# Patient Record
Sex: Male | Born: 1955 | Race: White | Hispanic: No | Marital: Married | Smoking: Never smoker
Health system: Southern US, Community
[De-identification: ages and names within clinical notes are randomized; demographics above are authoritative.]

## PROBLEM LIST (undated history)

## (undated) DIAGNOSIS — Z8719 Personal history of other diseases of the digestive system: Secondary | ICD-10-CM

## (undated) DIAGNOSIS — T85598A Other mechanical complication of other gastrointestinal prosthetic devices, implants and grafts, initial encounter: Secondary | ICD-10-CM

## (undated) DIAGNOSIS — J189 Pneumonia, unspecified organism: Secondary | ICD-10-CM

## (undated) DIAGNOSIS — F329 Major depressive disorder, single episode, unspecified: Secondary | ICD-10-CM

## (undated) DIAGNOSIS — F32A Depression, unspecified: Secondary | ICD-10-CM

## (undated) DIAGNOSIS — G1221 Amyotrophic lateral sclerosis: Secondary | ICD-10-CM

## (undated) DIAGNOSIS — K219 Gastro-esophageal reflux disease without esophagitis: Secondary | ICD-10-CM

## (undated) DIAGNOSIS — D649 Anemia, unspecified: Secondary | ICD-10-CM

## (undated) DIAGNOSIS — Z9911 Dependence on respirator [ventilator] status: Secondary | ICD-10-CM

## (undated) DIAGNOSIS — G4700 Insomnia, unspecified: Secondary | ICD-10-CM

## (undated) DIAGNOSIS — R32 Unspecified urinary incontinence: Secondary | ICD-10-CM

## (undated) DIAGNOSIS — F410 Panic disorder [episodic paroxysmal anxiety] without agoraphobia: Secondary | ICD-10-CM

## (undated) HISTORY — PX: TRACHEOSTOMY: SUR1362

## (undated) HISTORY — PX: TONSILLECTOMY: SUR1361

## (undated) HISTORY — PX: COLONOSCOPY: SHX174

## (undated) HISTORY — DX: Anemia, unspecified: D64.9

## (undated) HISTORY — PX: OTHER SURGICAL HISTORY: SHX169

## (undated) HISTORY — PX: ESOPHAGOGASTRODUODENOSCOPY: SHX1529

## (undated) HISTORY — PX: BRONCHOSCOPY: SUR163

## (undated) HISTORY — DX: Amyotrophic lateral sclerosis: G12.21

---

## 1998-05-06 ENCOUNTER — Ambulatory Visit (HOSPITAL_BASED_OUTPATIENT_CLINIC_OR_DEPARTMENT_OTHER): Admission: RE | Admit: 1998-05-06 | Discharge: 1998-05-06 | Payer: Self-pay | Admitting: Oral Surgery

## 1998-09-30 ENCOUNTER — Encounter: Payer: Self-pay | Admitting: Emergency Medicine

## 1998-09-30 ENCOUNTER — Emergency Department (HOSPITAL_COMMUNITY): Admission: EM | Admit: 1998-09-30 | Discharge: 1998-09-30 | Payer: Self-pay | Admitting: Emergency Medicine

## 1999-02-26 ENCOUNTER — Emergency Department (HOSPITAL_COMMUNITY): Admission: EM | Admit: 1999-02-26 | Discharge: 1999-02-26 | Payer: Self-pay | Admitting: Emergency Medicine

## 2001-12-21 ENCOUNTER — Encounter: Payer: Self-pay | Admitting: Pulmonary Disease

## 2003-09-24 ENCOUNTER — Encounter: Admission: RE | Admit: 2003-09-24 | Discharge: 2003-09-24 | Payer: Self-pay | Admitting: Infectious Diseases

## 2003-10-05 ENCOUNTER — Ambulatory Visit (HOSPITAL_COMMUNITY): Admission: RE | Admit: 2003-10-05 | Discharge: 2003-10-05 | Payer: Self-pay | Admitting: Gastroenterology

## 2003-10-15 ENCOUNTER — Inpatient Hospital Stay (HOSPITAL_COMMUNITY): Admission: EM | Admit: 2003-10-15 | Discharge: 2003-10-20 | Payer: Self-pay

## 2004-04-01 ENCOUNTER — Ambulatory Visit (HOSPITAL_COMMUNITY): Admission: RE | Admit: 2004-04-01 | Discharge: 2004-04-01 | Payer: Self-pay | Admitting: Gastroenterology

## 2004-04-11 ENCOUNTER — Ambulatory Visit: Payer: Self-pay | Admitting: Gastroenterology

## 2004-04-11 ENCOUNTER — Ambulatory Visit: Payer: Self-pay | Admitting: Internal Medicine

## 2004-04-11 ENCOUNTER — Inpatient Hospital Stay (HOSPITAL_COMMUNITY): Admission: EM | Admit: 2004-04-11 | Discharge: 2004-06-02 | Payer: Self-pay | Admitting: Internal Medicine

## 2004-04-25 ENCOUNTER — Encounter (INDEPENDENT_AMBULATORY_CARE_PROVIDER_SITE_OTHER): Payer: Self-pay | Admitting: *Deleted

## 2004-04-28 ENCOUNTER — Encounter (INDEPENDENT_AMBULATORY_CARE_PROVIDER_SITE_OTHER): Payer: Self-pay | Admitting: *Deleted

## 2004-05-05 ENCOUNTER — Encounter (INDEPENDENT_AMBULATORY_CARE_PROVIDER_SITE_OTHER): Payer: Self-pay | Admitting: *Deleted

## 2004-05-08 ENCOUNTER — Encounter (INDEPENDENT_AMBULATORY_CARE_PROVIDER_SITE_OTHER): Payer: Self-pay | Admitting: Specialist

## 2004-05-15 ENCOUNTER — Encounter (INDEPENDENT_AMBULATORY_CARE_PROVIDER_SITE_OTHER): Payer: Self-pay | Admitting: *Deleted

## 2004-06-09 ENCOUNTER — Ambulatory Visit: Payer: Self-pay | Admitting: Pulmonary Disease

## 2004-07-01 ENCOUNTER — Ambulatory Visit: Payer: Self-pay | Admitting: Pulmonary Disease

## 2004-07-20 ENCOUNTER — Ambulatory Visit: Payer: Self-pay | Admitting: Internal Medicine

## 2004-07-20 ENCOUNTER — Ambulatory Visit: Payer: Self-pay | Admitting: Critical Care Medicine

## 2004-07-20 ENCOUNTER — Inpatient Hospital Stay (HOSPITAL_COMMUNITY): Admission: AD | Admit: 2004-07-20 | Discharge: 2004-07-25 | Payer: Self-pay | Admitting: Pulmonary Disease

## 2004-07-24 ENCOUNTER — Encounter (INDEPENDENT_AMBULATORY_CARE_PROVIDER_SITE_OTHER): Payer: Self-pay | Admitting: *Deleted

## 2004-08-12 ENCOUNTER — Ambulatory Visit: Payer: Self-pay | Admitting: Gastroenterology

## 2004-08-12 ENCOUNTER — Ambulatory Visit: Payer: Self-pay | Admitting: Pulmonary Disease

## 2004-09-12 ENCOUNTER — Ambulatory Visit (HOSPITAL_COMMUNITY): Admission: RE | Admit: 2004-09-12 | Discharge: 2004-09-12 | Payer: Self-pay | Admitting: Neurology

## 2004-10-24 ENCOUNTER — Ambulatory Visit: Payer: Self-pay | Admitting: Gastroenterology

## 2004-10-24 ENCOUNTER — Ambulatory Visit: Payer: Self-pay | Admitting: Pulmonary Disease

## 2004-12-19 ENCOUNTER — Ambulatory Visit: Payer: Self-pay | Admitting: Pulmonary Disease

## 2004-12-24 ENCOUNTER — Ambulatory Visit (HOSPITAL_COMMUNITY): Admission: RE | Admit: 2004-12-24 | Discharge: 2004-12-24 | Payer: Self-pay | Admitting: Pulmonary Disease

## 2005-01-20 ENCOUNTER — Ambulatory Visit (HOSPITAL_COMMUNITY): Admission: RE | Admit: 2005-01-20 | Discharge: 2005-01-20 | Payer: Self-pay | Admitting: Gastroenterology

## 2005-01-20 ENCOUNTER — Ambulatory Visit: Payer: Self-pay | Admitting: Gastroenterology

## 2005-02-17 ENCOUNTER — Ambulatory Visit: Payer: Self-pay | Admitting: Pulmonary Disease

## 2005-04-21 ENCOUNTER — Ambulatory Visit: Payer: Self-pay | Admitting: Gastroenterology

## 2005-04-21 ENCOUNTER — Ambulatory Visit (HOSPITAL_COMMUNITY): Admission: RE | Admit: 2005-04-21 | Discharge: 2005-04-21 | Payer: Self-pay | Admitting: Neurology

## 2005-04-21 ENCOUNTER — Ambulatory Visit (HOSPITAL_COMMUNITY): Admission: RE | Admit: 2005-04-21 | Discharge: 2005-04-21 | Payer: Self-pay | Admitting: Gastroenterology

## 2005-06-17 ENCOUNTER — Ambulatory Visit: Payer: Self-pay | Admitting: Pulmonary Disease

## 2005-06-22 ENCOUNTER — Ambulatory Visit: Payer: Self-pay | Admitting: Pulmonary Disease

## 2005-07-19 ENCOUNTER — Emergency Department (HOSPITAL_COMMUNITY): Admission: EM | Admit: 2005-07-19 | Discharge: 2005-07-19 | Payer: Self-pay | Admitting: Emergency Medicine

## 2006-01-22 ENCOUNTER — Ambulatory Visit: Payer: Self-pay | Admitting: Pulmonary Disease

## 2006-05-22 IMAGING — CR DG CHEST 2V
2 series · 2 of 2 positions shown · non-contrast
Comparison: none

CLINICAL DATA: Shortness of breath, fever. 
 CHEST, TWO VIEWS 04/01/04 
 There are mildly accentuated bronchial markings consistent with mild bronchitic changes.  There are no infiltrates, and the heart and mediastinal structures are normal. 
 IMPRESSION
 Mild bronchitic changes.  No acute infiltrate.

[view not recorded (1 of 2)]
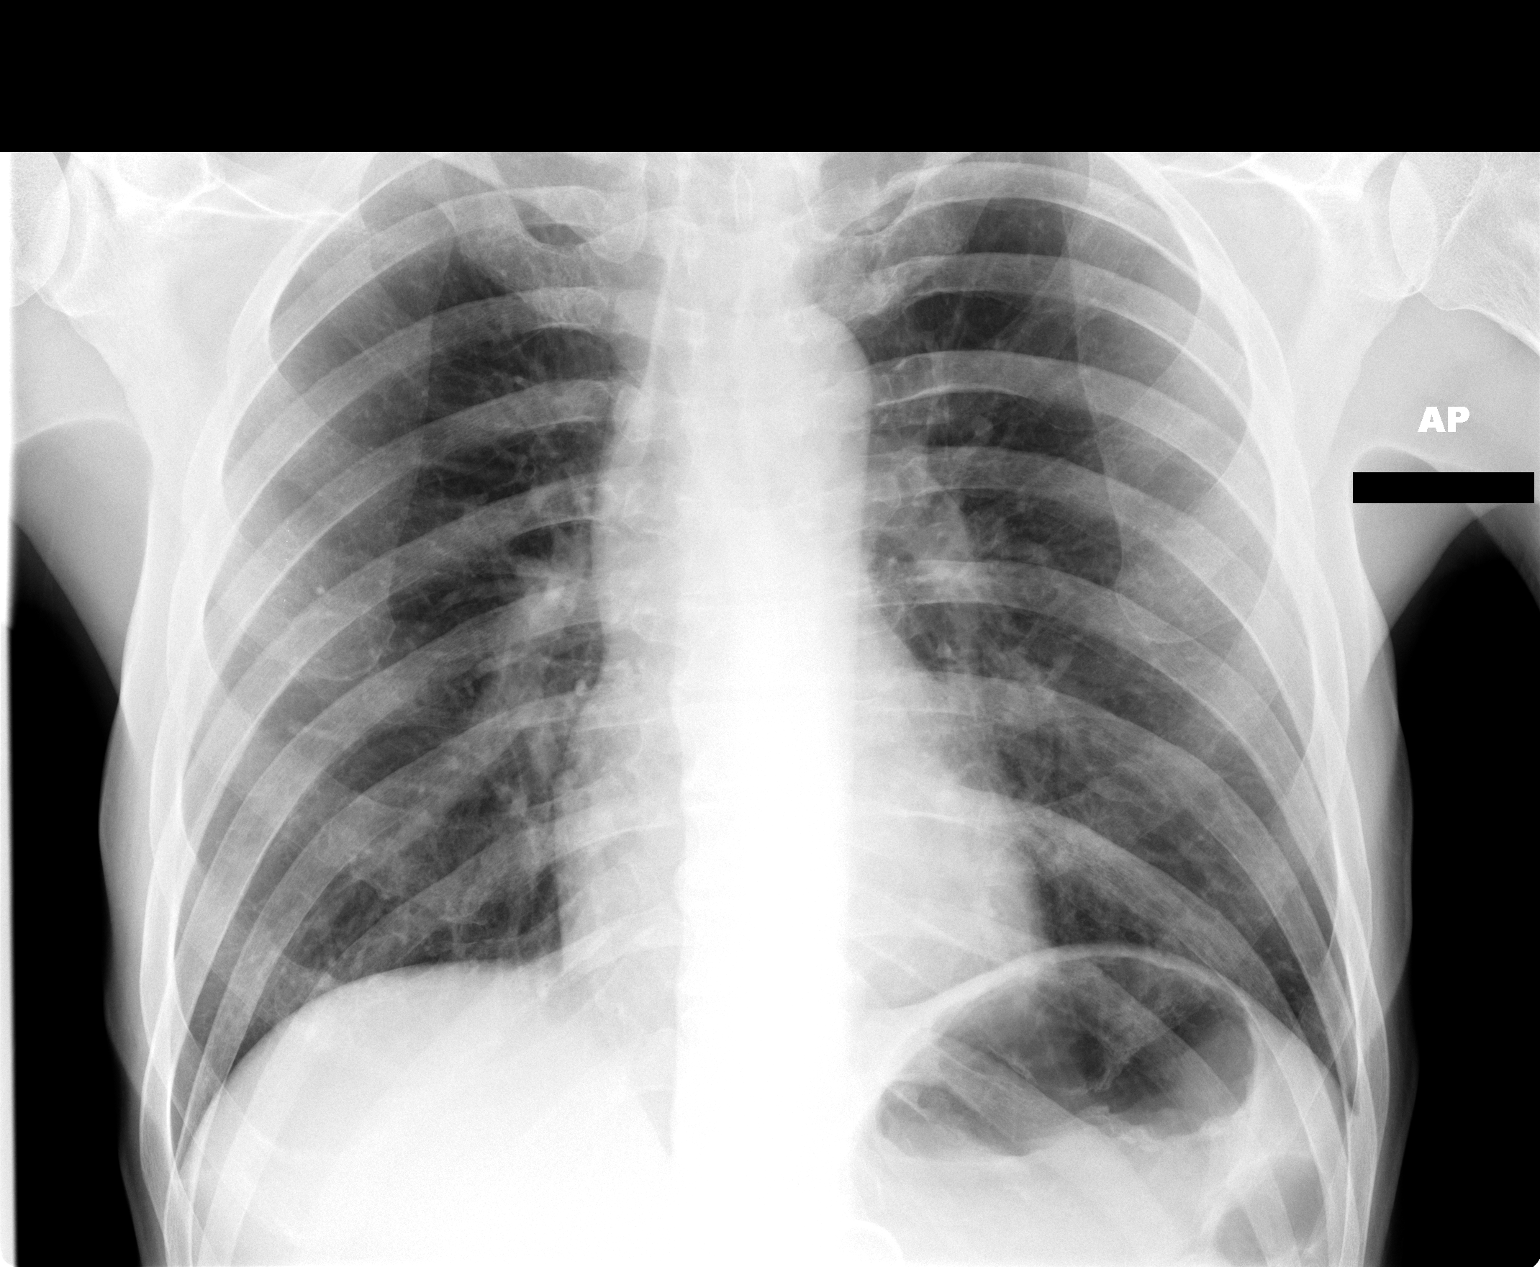

[view not recorded (2 of 2)]
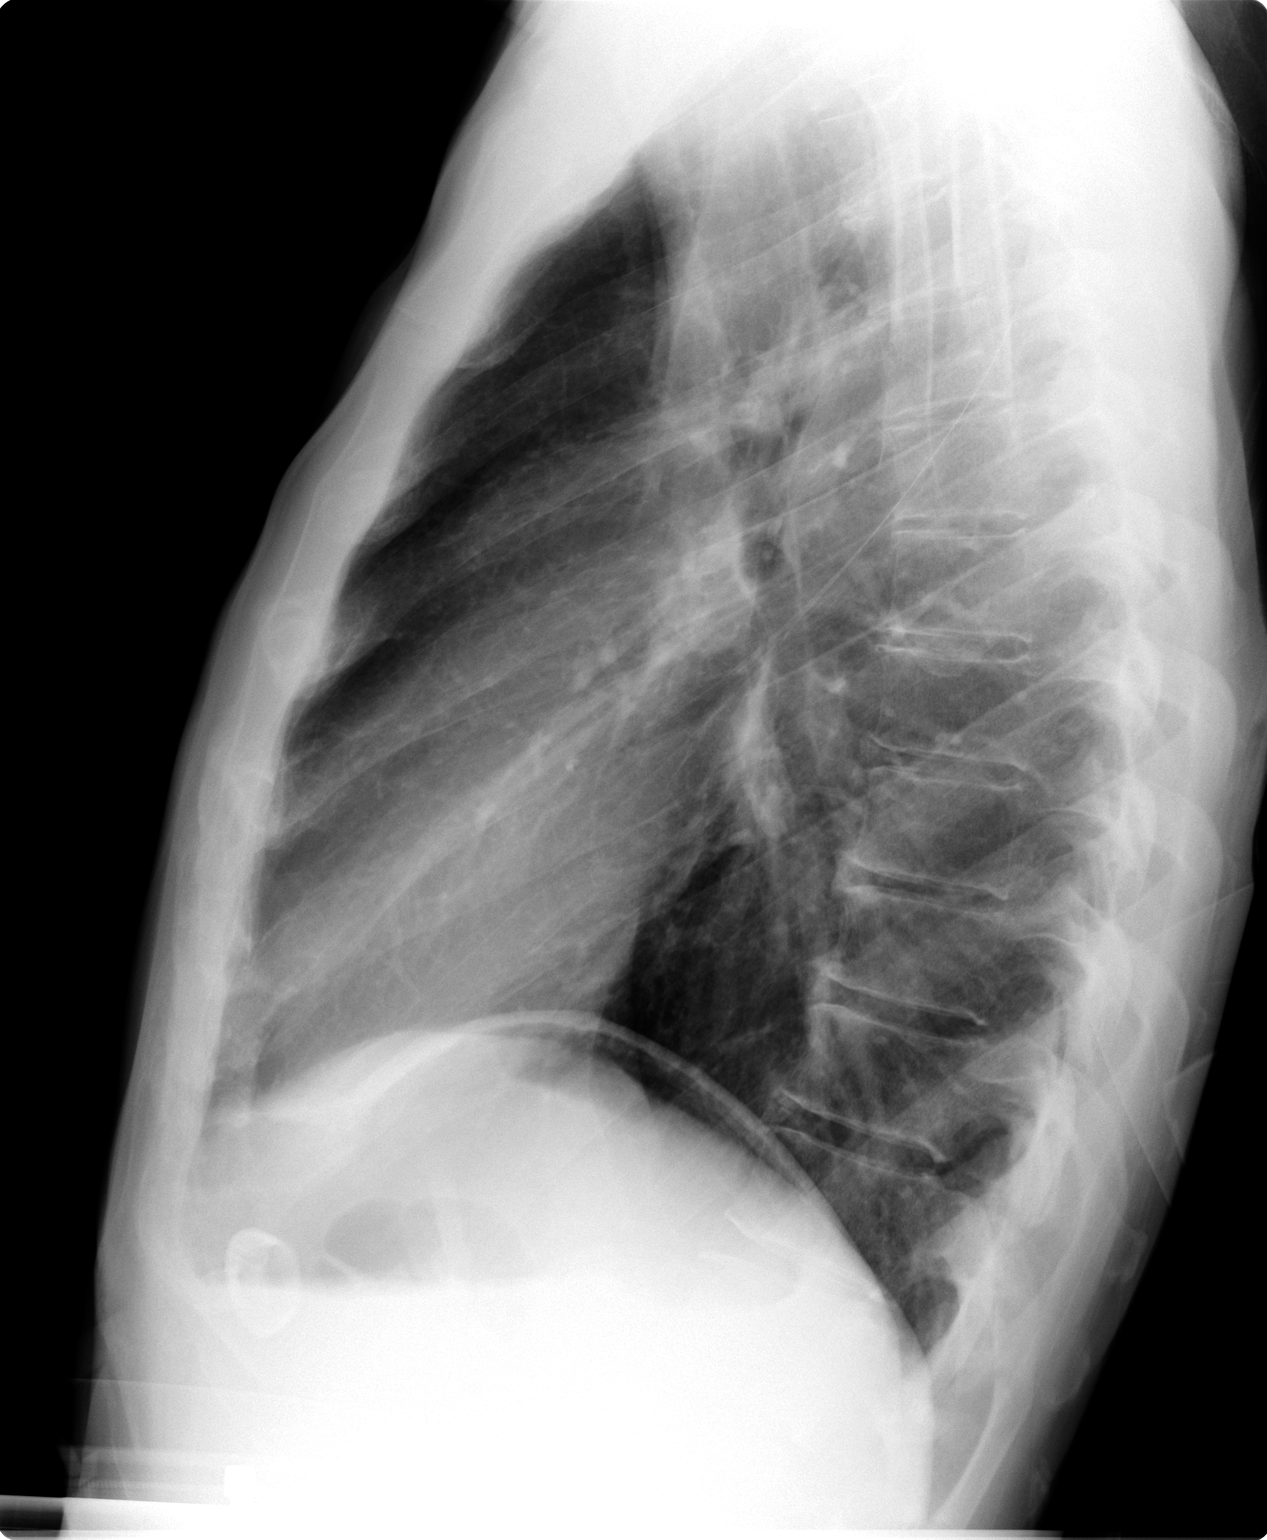

[2 of 2 positions shown; findings below may reference images not displayed]

## 2006-06-23 IMAGING — CR DG CHEST 1V PORT
1 series · 1 of 1 positions shown · non-contrast
Comparison: 05/01/04.

CLINICAL DATA: 48 year old male, respiratory failure.
 DIAGNOSTIC PORTABLE SINGLE VIEW CHEST RADIOGRAPHY - 05/03/04:

[view not recorded]
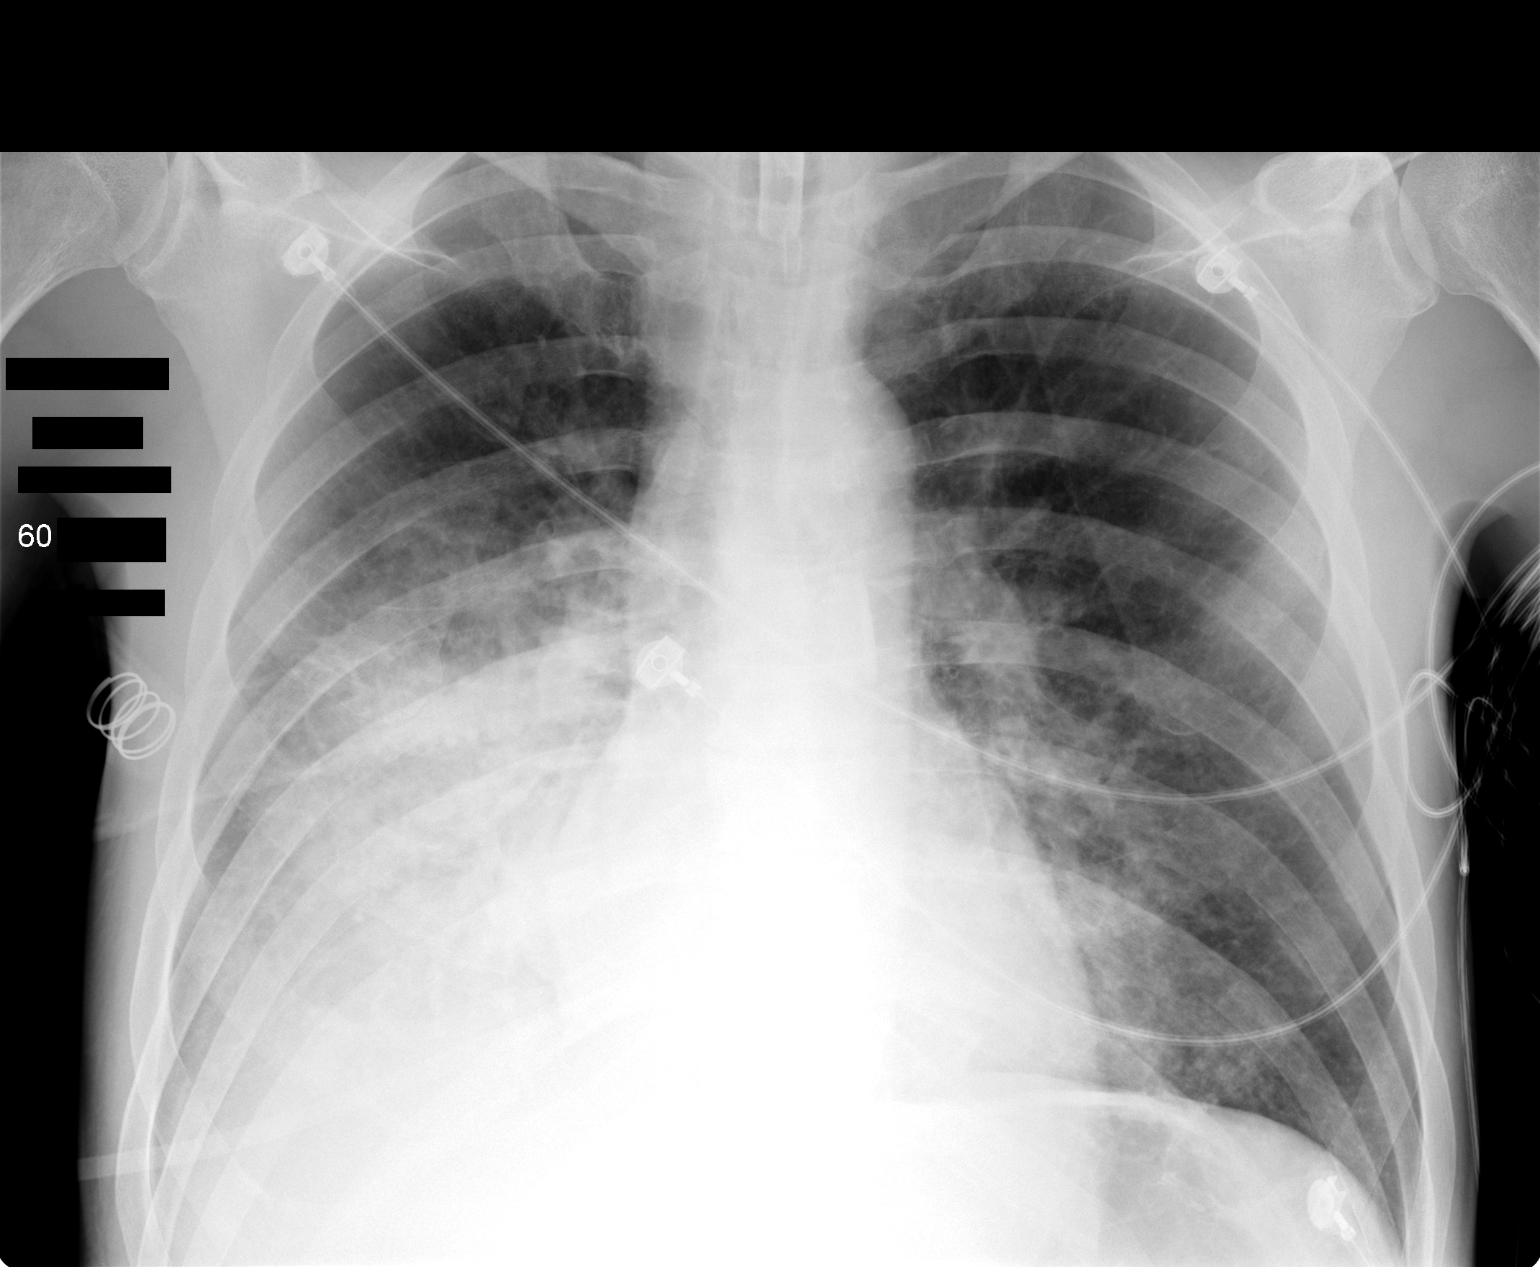

[1 of 1 positions shown; findings below may reference images not displayed]

FINDINGS: The tracheostomy tube remains.  Consolidated air space disease is again noted in the right lower lobe diffusely.  Left lower lobe air space disease is also evident.  Overall there is no change in lung aeration.  No pneumothorax.
IMPRESSION: Stable bilateral air space disease, right greater than left.

## 2006-06-24 IMAGING — CR DG CHEST 1V PORT
1 series · 1 of 1 positions shown · non-contrast
Comparison: 05/03/04.

CLINICAL DATA: 48-year-old male.  Respiratory failure, ventilatory support.
 CHEST PORTABLE, ONE VIEW 05/04/04:

[view not recorded]
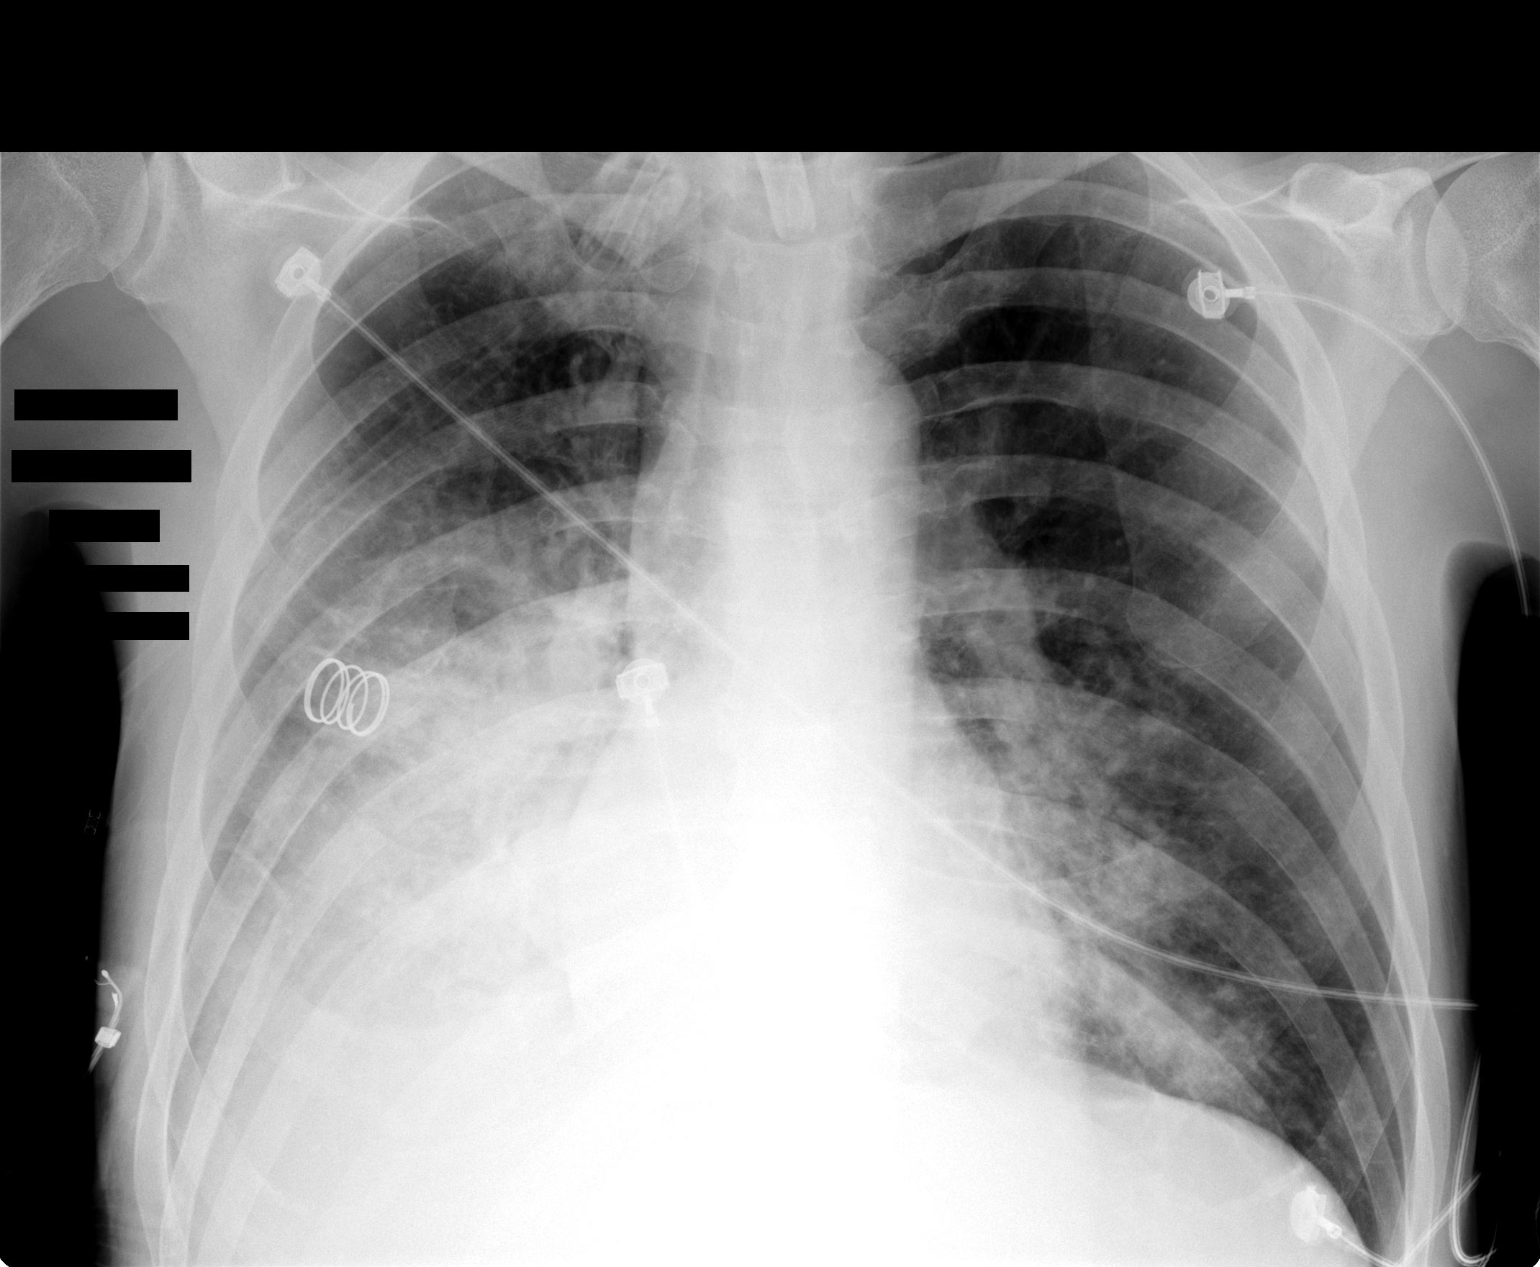

[1 of 1 positions shown; findings below may reference images not displayed]

FINDINGS: Tracheostomy tube remains.  No significant change in right greater than left lower lobe airspace disease/pneumonia.  No pneumothorax.  Stable exam.
IMPRESSION: No significant change in right greater than left lower lobe airspace disease/pneumonia.

## 2006-08-04 ENCOUNTER — Ambulatory Visit: Payer: Self-pay | Admitting: Pulmonary Disease

## 2007-06-21 DIAGNOSIS — J9610 Chronic respiratory failure, unspecified whether with hypoxia or hypercapnia: Secondary | ICD-10-CM | POA: Insufficient documentation

## 2007-07-28 ENCOUNTER — Encounter: Payer: Self-pay | Admitting: Pulmonary Disease

## 2007-09-12 ENCOUNTER — Telehealth: Payer: Self-pay | Admitting: Pulmonary Disease

## 2007-09-20 ENCOUNTER — Encounter: Payer: Self-pay | Admitting: Pulmonary Disease

## 2007-10-12 ENCOUNTER — Encounter: Payer: Self-pay | Admitting: Pulmonary Disease

## 2007-11-24 ENCOUNTER — Telehealth (INDEPENDENT_AMBULATORY_CARE_PROVIDER_SITE_OTHER): Payer: Self-pay | Admitting: *Deleted

## 2007-11-24 ENCOUNTER — Encounter: Payer: Self-pay | Admitting: Pulmonary Disease

## 2008-01-16 ENCOUNTER — Encounter: Payer: Self-pay | Admitting: Gastroenterology

## 2008-01-26 ENCOUNTER — Encounter: Payer: Self-pay | Admitting: Critical Care Medicine

## 2008-02-13 ENCOUNTER — Encounter: Payer: Self-pay | Admitting: Pulmonary Disease

## 2008-04-19 ENCOUNTER — Telehealth: Payer: Self-pay | Admitting: Pulmonary Disease

## 2008-04-26 ENCOUNTER — Telehealth: Payer: Self-pay | Admitting: Pulmonary Disease

## 2008-05-16 ENCOUNTER — Telehealth (INDEPENDENT_AMBULATORY_CARE_PROVIDER_SITE_OTHER): Payer: Self-pay | Admitting: *Deleted

## 2008-06-21 ENCOUNTER — Telehealth (INDEPENDENT_AMBULATORY_CARE_PROVIDER_SITE_OTHER): Payer: Self-pay | Admitting: *Deleted

## 2008-07-20 ENCOUNTER — Telehealth: Payer: Self-pay | Admitting: Gastroenterology

## 2008-07-24 ENCOUNTER — Telehealth: Payer: Self-pay | Admitting: Gastroenterology

## 2008-08-14 ENCOUNTER — Telehealth: Payer: Self-pay | Admitting: Gastroenterology

## 2008-08-22 ENCOUNTER — Encounter: Payer: Self-pay | Admitting: Pulmonary Disease

## 2008-08-22 ENCOUNTER — Telehealth: Payer: Self-pay | Admitting: Gastroenterology

## 2008-09-20 ENCOUNTER — Encounter: Payer: Self-pay | Admitting: Gastroenterology

## 2008-11-28 ENCOUNTER — Telehealth: Payer: Self-pay | Admitting: Gastroenterology

## 2008-11-28 ENCOUNTER — Encounter: Payer: Self-pay | Admitting: Gastroenterology

## 2008-11-29 ENCOUNTER — Encounter: Payer: Self-pay | Admitting: Gastroenterology

## 2008-12-05 ENCOUNTER — Telehealth: Payer: Self-pay | Admitting: Gastroenterology

## 2009-07-01 ENCOUNTER — Inpatient Hospital Stay (HOSPITAL_COMMUNITY): Admission: EM | Admit: 2009-07-01 | Discharge: 2009-07-07 | Payer: Self-pay | Admitting: Emergency Medicine

## 2009-07-01 ENCOUNTER — Encounter: Payer: Self-pay | Admitting: Internal Medicine

## 2009-07-01 ENCOUNTER — Ambulatory Visit: Payer: Self-pay | Admitting: Gastroenterology

## 2009-07-01 ENCOUNTER — Ambulatory Visit: Payer: Self-pay | Admitting: Internal Medicine

## 2009-07-06 ENCOUNTER — Ambulatory Visit: Payer: Self-pay | Admitting: Internal Medicine

## 2009-07-06 ENCOUNTER — Encounter (INDEPENDENT_AMBULATORY_CARE_PROVIDER_SITE_OTHER): Payer: Self-pay | Admitting: Internal Medicine

## 2009-07-09 ENCOUNTER — Encounter: Payer: Self-pay | Admitting: Gastroenterology

## 2009-07-09 ENCOUNTER — Ambulatory Visit (HOSPITAL_COMMUNITY): Admission: RE | Admit: 2009-07-09 | Discharge: 2009-07-09 | Payer: Self-pay | Admitting: Gastroenterology

## 2009-07-09 ENCOUNTER — Telehealth: Payer: Self-pay | Admitting: Gastroenterology

## 2009-07-12 ENCOUNTER — Telehealth: Payer: Self-pay | Admitting: Gastroenterology

## 2009-07-17 ENCOUNTER — Encounter: Payer: Self-pay | Admitting: Pulmonary Disease

## 2009-07-18 ENCOUNTER — Encounter (INDEPENDENT_AMBULATORY_CARE_PROVIDER_SITE_OTHER): Payer: Self-pay | Admitting: Internal Medicine

## 2009-07-22 ENCOUNTER — Ambulatory Visit: Payer: Self-pay | Admitting: Pulmonary Disease

## 2009-07-22 DIAGNOSIS — G1221 Amyotrophic lateral sclerosis: Secondary | ICD-10-CM

## 2009-07-25 ENCOUNTER — Encounter: Payer: Self-pay | Admitting: Internal Medicine

## 2009-09-26 ENCOUNTER — Encounter: Payer: Self-pay | Admitting: Pulmonary Disease

## 2009-12-12 ENCOUNTER — Telehealth: Payer: Self-pay | Admitting: Pulmonary Disease

## 2009-12-26 ENCOUNTER — Telehealth (INDEPENDENT_AMBULATORY_CARE_PROVIDER_SITE_OTHER): Payer: Self-pay | Admitting: *Deleted

## 2009-12-27 ENCOUNTER — Telehealth (INDEPENDENT_AMBULATORY_CARE_PROVIDER_SITE_OTHER): Payer: Self-pay | Admitting: *Deleted

## 2009-12-29 ENCOUNTER — Encounter: Payer: Self-pay | Admitting: Pulmonary Disease

## 2009-12-30 ENCOUNTER — Encounter: Payer: Self-pay | Admitting: Pulmonary Disease

## 2010-01-28 ENCOUNTER — Telehealth (INDEPENDENT_AMBULATORY_CARE_PROVIDER_SITE_OTHER): Payer: Self-pay | Admitting: *Deleted

## 2010-01-30 ENCOUNTER — Telehealth: Payer: Self-pay | Admitting: Pulmonary Disease

## 2010-01-31 ENCOUNTER — Encounter: Payer: Self-pay | Admitting: Pulmonary Disease

## 2010-04-29 ENCOUNTER — Telehealth (INDEPENDENT_AMBULATORY_CARE_PROVIDER_SITE_OTHER): Payer: Self-pay | Admitting: *Deleted

## 2010-05-02 ENCOUNTER — Telehealth (INDEPENDENT_AMBULATORY_CARE_PROVIDER_SITE_OTHER): Payer: Self-pay | Admitting: *Deleted

## 2010-05-02 ENCOUNTER — Encounter: Payer: Self-pay | Admitting: Pulmonary Disease

## 2010-05-29 ENCOUNTER — Encounter: Payer: Self-pay | Admitting: Pulmonary Disease

## 2010-06-09 ENCOUNTER — Ambulatory Visit (HOSPITAL_COMMUNITY)
Admission: RE | Admit: 2010-06-09 | Discharge: 2010-06-09 | Payer: Self-pay | Source: Home / Self Care | Admitting: Otolaryngology

## 2010-07-31 NOTE — Op Note (Addendum)
  NAME:  [name], [name]             ACCOUNT NO.:  [id number]  MEDICAL RECORD NO.:  [id number]          PATIENT TYPE:  AMB  LOCATION:  SDS                          FACILITY:  [facility]  PHYSICIAN:  Satara Virella H. 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  PROCEDURE:  Changing of tracheostomy tube in the operating room under monitored anesthesia care.  COMPLICATIONS:  No complications.  ESTIMATED BLOOD LOSS:  No blood loss.  FINDINGS:  Stenotic tracheostomy stoma with healthy-appearing mucosa in the trachea.  No complications.  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  He has monthly tracheostomy changes by his respiratory therapist, but they were unable to remove the trache recently and I tried in my office and was unable to do all this well. Recommended that we do this in the operating room to be able to handle any potential complications.  Risks, benefits, alternatives and complications of the procedure were explained to the patient and his wife.  She seemed to understand and agreed to the procedure.  The patient was taken to the operating room, placed in the supine position. A drape was applied to the chest and lower body.  The tracheostomy tube was released from its straps.  The cuff was deflated.  The circuit was removed and the trache tube was removed from the stoma.  There was difficulty right at the very skin opening, but I was able to force it out to see that the trachea itself was in good shape.  There was no granulation tissue and the major stenosis really is right at the  skin. I was relatively easily able to place a new #6 cuffed tube, but was unable to place #8.  Six was left in place, secured with needle straps and a dressing was applied underneath the shield.  The patient was then transferred back to PACU in stable condition.     Rene Gonsoulin H. Pollyann Kennedy, MD     JHR/MEDQ  D:  06/09/2010  T:  06/10/2010  Job:  [id number]  Electronically Signed by Serena Colonel MD on 07/31/2010 10:34:00 AM

## 2010-08-05 ENCOUNTER — Ambulatory Visit: Admit: 2010-08-05 | Payer: Self-pay | Admitting: Pulmonary Disease

## 2010-08-05 NOTE — Miscellaneous (Signed)
Summary: Order for Ambu Bag/[facility]  Order for Ambu Bag/[facility]   Imported By: Sherian Rein 10/01/2009 14:35:26  _____________________________________________________________________  External Attachment:    Type:   Image     Comment:   External Document

## 2010-08-05 NOTE — Progress Notes (Signed)
Summary: add to labs?   Phone Note Call from Patient   Caller: pt's home nurse vicki Call For: clance Summary of Call: caller states that since they have received order for labs, pt's bs has run between 155-181. does dr clance want to add A1C check to labs? vicki [phone] Initial call taken by: Tivis Ringer, CNA,  December 26, 2009 10:37 AM  Follow-up for Phone Call        dr clance pls advise if you want [place] to add a1c to labs they have for pt   Philipp Deputy [facility]  December 26, 2009 10:55 AM   Additional Follow-up for Phone Call Additional follow up Details #1::        if his blood sugars have been in that range, I see no reason to check hgb a1c Additional Follow-up by: Barbaraann Share MD,  December 26, 2009 1:40 PM    Additional Follow-up for Phone Call Additional follow up Details #2::    Spoke with [name].  Informed her of above statement per KC-she verbalized understanding, Also had another question for [facility].  States for the last wk, 5 out of 7 days, pt having resp distress and a lot of thick, white secretions.  Pt communicated he doesn't think he is being ventalated enough.  [name] would like to know if vent settings needs to be readdressed.  Will forward message back to KC-pls advise.  Thanks! Gweneth Dimitri RN  December 26, 2009 1:53 PM   Additional Follow-up for Phone Call Additional follow up Details #3:: Details for Additional Follow-up Action Taken: we need rt to report back on current vent settings, if he is on pressure support what is his spontaneous tidal volumes, what is the pt's total respiratory rate most of the time. also need abg on current vent settings first thing in am just as pt is waking up.  Spoke with Jill Side who was covering for [employer].  She states that pt is doing fine this am.  Sats 99%.  His peak insp time was at 0.8 but as of 6/20 this was changed to 1.0.  Resp rate ranges from 12-16.Peep set at 5.  She will get morning abg and report back on that next wk. Vernie Murders  December 27, 2009 9:19 AM  I still need more on the vent settings.  Need:  what is rate, what is mode of ventilation, is he on pressure support and if he is what pressure.  what is his FiO2?, what is his set volume, and what is his spontaneous volume if he is on pressure support.  Is the person you are talking to a real respiratory therapist? IF not then a REAL one needs to call us with the data we require.  Called, spoke with [name].  Per [name], call [name] Rosenberg-pt's RT- at [phone] for this info. Called, spoke with [name].  Per [name], pt's vent settings are as follows:  assist control of 12, tidal volune 650, peep 5, 21% oxygen.  Will forward message to [facility].  Crystal Jones RN  December 30, 2009 11:04 AM  excellent!!  please have them send the abg results to me once done, and we will see about vent changes to address his symptom complex.  Spoke with [name], informed her to make sure they send [place] a copy of abg results once done so [facility] can address changes - she verbalized understanding Gweneth Dimitri RN  December 30, 2009 5:24 PM  Additional Follow-up by: Barbaraann Share  MD,  December 27, 2009 8:54 AM

## 2010-08-05 NOTE — Progress Notes (Signed)
Summary: talk to nurse  Phone Note Other Incoming Call back at [phone]   Caller: allison//home nurse Summary of Call: Revonda Standard stated that respiratory said that abgs are not done in the home, pt would have to be taken to ER, she wants to know does he absolutely have to have this done. Initial call taken by: Darletta Moll,  December 27, 2009 10:17 AM  Follow-up for Phone Call        Please advise if this is neccessary, thank! Follow-up by: Vernie Murders,  December 27, 2009 10:45 AM  Additional Follow-up for Phone Call Additional follow up Details #1::        [facility] has drawn abgs in the home many times for me in the past.  I have no way of knowing what his carbon dioxide level is doing on his current vent settings.  If there are questions about tidal volumes and rate, we must have abg's before we make any changes to his settings Additional Follow-up by: Barbaraann Share MD,  December 27, 2009 5:22 PM    Additional Follow-up for Phone Call Additional follow up Details #2::    Spoke with [name].  She states that they can not do the ABG.  She does not work with [facility].  [facility] does do these in the home so if you want this done please send order to [facility]. Thanks! Follow-up by: Vernie Murders,  December 27, 2009 5:29 PM  Additional Follow-up for Phone Call Additional follow up Details #3:: Details for Additional Follow-up Action Taken: order sent to [facility] for [facility] to draw blood gas.  Called, spoke with [name],  Informed her order sent for [facility] to come outto draw ABG.  She verbalized understanding and voiced no further concerns at this time.  Gweneth Dimitri RN  December 30, 2009 8:59 AM  Additional Follow-up by: Barbaraann Share MD,  December 27, 2009 5:35 PM

## 2010-08-05 NOTE — Progress Notes (Signed)
Summary: order  Phone Note From Other Clinic   Caller: home nurse  vickie Call For: [name] Summary of Call: have questions about cbg order Initial call taken by: Rickard Patience,  December 12, 2009 2:14 PM  Follow-up for Phone Call        nurse states they were given an order by kc at 1/17 ov to check bs's twice a week but some how this order got d/c'd in march and his reg. nurse called today because she does not see a d/c order for this and wants to know if kc wants to resume with twice a week testing or d/c order all together--pls advise Follow-up by: Philipp Deputy CMA,  December 12, 2009 2:26 PM  Additional Follow-up for Phone Call Additional follow up Details #1::        since I never d/ced the checks, I do not need to send an order to restart.  Yes, they need to be done. Additional Follow-up by: Barbaraann Share MD,  December 12, 2009 5:35 PM    Additional Follow-up for Phone Call Additional follow up Details #2::    spoke with [facility] and advised to resume twice a week testing. Carron Curie CMA  December 13, 2009 8:44 AM

## 2010-08-05 NOTE — Letter (Signed)
Summary: Order for Bed & Lift Scale / [employer]  Order for Bed & Lift Scale / [employer]   Imported By: Lennie Odor 05/07/2010 10:11:46  _____________________________________________________________________  External Attachment:    Type:   Image     Comment:   External Document

## 2010-08-05 NOTE — Miscellaneous (Signed)
Summary: [facility]: Orders  [facility]: Orders   Imported By: Florinda Marker 07/30/2009 14:48:58  _____________________________________________________________________  External Attachment:    Type:   Image     Comment:   External Document

## 2010-08-05 NOTE — Procedures (Signed)
Summary: Upper Endoscopy  Patient: [name] Note: All result statuses are Final unless otherwise noted.  Tests: (1) Upper Endoscopy (EGD)   EGD Upper Endoscopy       DONE     [facility]     [address] [place], [place]  [address]           ENDOSCOPY PROCEDURE REPORT           PATIENT:  [name], [name]  MR#:  [id number]     BIRTHDATE:  May 07, 1956, 53 yrs. old  GENDER:  male           ENDOSCOPIST:  Barbette Hair. Arlyce Dice, MD     Referred by:           PROCEDURE DATE:  07/09/2009     PROCEDURE:  EGD, diagnostic     ASA CLASS:  Class III     INDICATIONS:  melena Recent admission for bleeding duodenal ulcer                 MEDICATIONS:   Fentanyl 50 mcg, Versed 4     TOPICAL ANESTHETIC:           DESCRIPTION OF PROCEDURE:   After the risks benefits and     alternatives of the procedure were thoroughly explained, informed     consent was obtained.  The EG-[id number] ([id number]) endoscope was     introduced through the mouth and advanced to the third portion of     the duodenum, without limitations.  The instrument was slowly     withdrawn as the mucosa was fully examined.     <<PROCEDUREIMAGES>>           An ulcer was found in the apex of the duodenal bulb. 1.5cm ulcer     at apex of duodenum. There are areas at base of ulcer probably     corresponding to previously fulgurated vessel. Areas are flat.     There's no fresh or old blood (see image003 and image005).  other     findings. Gastrostomy tube in place. No ulcerations.  Otherwise     the examination was normal. No blood in stomach    Retroflexed     views revealed no abnormalities.    The scope was then withdrawn     from the patient and the procedure completed.           COMPLICATIONS:  None           ENDOSCOPIC IMPRESSION:     1) Ulcer in the bulb/descending duodenum     2) Other findings     3) Otherwise normal examination           No evidence for acute bleeding and no endscopic stigmata of recent     hemorrhage     RECOMMENDATIONS:     1) continue BID  PPI     2) CBC today           REPEAT EXAM:  No           ______________________________     Barbette Hair. Arlyce Dice, MD           CC:  Barbaraann Share, MD           n.     Rosalie DoctorBarbette Hair. Kaplan at 07/09/2009 06:12 PM           Kristopher Oppenheim, [phone]  Note: An exclamation mark Marland Kitchen)  indicates a result that was not dispersed into the flowsheet. Document Creation Date: 07/09/2009 6:13 PM _______________________________________________________________________  (1) Order result status: Final Collection or observation date-time: 07/09/2009 18:02 Requested date-time:  Receipt date-time:  Reported date-time:  Referring Physician:   Ordering Physician: Melvia Heaps [phone]) Specimen Source:  Source: Launa Grill Order Number: [id number] Lab site:

## 2010-08-05 NOTE — Progress Notes (Signed)
Summary: Lorazepam refill request  Phone Note Refill Request Message from:  Fax from Pharmacy  Refills Requested: Medication #1:  LORAZEPAM 0.5 MG  TABS Take 1 tablet by mouth two times a day as needed   Last Refilled: 07/18/2009   Notes: #60 Message from pharmacist: **PATIENT ON LY WANTS 30 TABLETS AT A TIME*** Pt last seen 07/22/2009, next appointment scheduled Jul 21, 2010 Please advise [facility] [address] [place], [place] phone [phone] fax [phone]   Initial call taken by: Zackery Barefoot CMA,  January 30, 2010 10:33 AM  Follow-up for Phone Call        ok to fill Follow-up by: Barbaraann Share MD,  January 30, 2010 5:05 PM  Additional Follow-up for Phone Call Additional follow up Details #1::        Rx called to pharmacy Additional Follow-up by: Zackery Barefoot CMA,  January 30, 2010 5:14 PM    Prescriptions: LORAZEPAM 0.5 MG  TABS (LORAZEPAM) Take 1 tablet by mouth two times a day as needed  #60 x 0   Entered by:   Zackery Barefoot CMA   Authorized by:   Barbaraann Share MD   Signed by:   Zackery Barefoot CMA on 01/30/2010   Method used:   Telephoned to ...       [facility]  [place] [address] (retail)       [address]       [place], [place]  [address]       Ph: [phone] or [phone]       Fax: [phone]   RxID:   [id number]

## 2010-08-05 NOTE — Letter (Signed)
Summary: [facility] [employer]  [facility]/ [employer]   Imported By: Lester Gantt 06/06/2010 08:15:49  _____________________________________________________________________  External Attachment:    Type:   Image     Comment:   External Document

## 2010-08-05 NOTE — Assessment & Plan Note (Signed)
Summary: posthospital f/u for ALS/chronic RF   CC:  Pt is here for a post hosp f/u appt. Pt's caregivers state pt has been coughing up white sputum. Caregiver states "breathing is ok."  Denies temp.   Marland Kitchen  History of Present Illness: The pt comes in today for f/u of his ventilator dependent chronic respiratory failure secondary to ALS.  He has had significant progression of his neurologic disease since his last visit, and is only able to communicate with eye movements.  He was recently in the hospital for GIB, and also had ?pseudomonas pna during that time.  Currently, is doing well on his vent with no respiratory distress.  He has very little in the way of secretions, and has had no issues with mucus plugging per his private nurses.  He did have hyperglycemia while in the hospital, but not excessive.  The most recent checks have all been less than 200.  He has gained considerable weight since the last time I saw him, and would benefit from further nutritional therapy regarding caloric intake and tube feeds.    Medications Prior to Update: 1)  Zyrtec Childrens Allergy 1 Mg/ml Syrp (Cetirizine Hcl) .... 2  Tsp By G-Tube Once Daily Two Times A Day 2)  Klonopin 0.5 Mg  Tabs (Clonazepam) .... Take 1 Tablet By Mouth Two Times A Day 3)  Effexor 75 Mg  Tabs (Venlafaxine Hcl) .... Take 1 Tablet By Mouth Three Times A Day 4)  Vitamin B-12 1000 Mcg  Tabs (Cyanocobalamin) .... Take 1 Tablet By Mouth Once A Day 5)  Benefiber   Tabs (Wheat Dextrin) .... Take 1 Tablet By Mouth Once A Day 6)  Pulmocare   Liqd (Nutritional Supplements) .... 4 Cans A Day 7)  Resource Beneprotein   Pack (Protein) .Marland Kitchen.. 1 Four Times A Day 8)  Multivitamin & Mineral  Liqd (Multiple Vitamins-Minerals) .... Take 2 Tbsp By G-Tube Daily 9)  Ipratropium Bromide 0.02 %  Soln (Ipratropium Bromide) .... Four Times A Day 10)  Lortab 5 5-500 Mg  Tabs (Hydrocodone-Acetaminophen) .... Take 1 Tablet By Mouth Every4 Hours As Needed For Pain 11)   Lorazepam 0.5 Mg  Tabs (Lorazepam) .... Take 1 Tablet By Mouth Two Times A Day As Needed 12)  Albuterol Sulfate (2.5 Mg/71ml) 0.083%  Nebu (Albuterol Sulfate) .... Every 4 Hours As Needed 13)  Delsym 30 Mg/1ml  Lqcr (Dextromethorphan Polistirex) .... Every 12 Hours As Needed 14)  Miralax  Powd (Polyethylene Glycol 3350) .Marland Kitchen.. 17gm in 8oz. Water or Juice Daily.  May Use As Many Times As Needed For Constipation 15)  Fish Oil  Oil (Fish Oil) .... 5ml Per G-Tube Three Times A Day 16)  Baclofen 10 Mg Tabs (Baclofen) .... Take 1 Tab By G-Tube Three Times A Day 17)  Acuvail 0.45 % Soln (Ketorolac Tromethamine) .Marland Kitchen.. 1 Drop Both Eyes Daily 18)  Nexium 40 Mg Pack (Esomeprazole Magnesium) .... Take 1 Tab By G-Tube Twice Daily For 4 Weeks Then Once Daily 19)  Systane 0.4-0.3 % Soln (Polyethyl Glycol-Propyl Glycol) .Marland Kitchen.. 1 Drop Both Eyes Every 1-2hrs 20)  Restoril 30 Mg Caps (Temazepam) .... Take 1 Capsule By G-Tube At Bedtime 21)  Invanz 1 Gm Solr (Ertapenem Sodium) .... Take 1 Gram Iv Once Daily For 5 Days.  Allergies (verified): 1)  ! Levaquin 2)  ! Septra  Review of Systems      See HPI  Vital Signs:  Patient profile:   55 year old male O2 Sat:  97 % on Room air Temp:     98.3 degrees F oral Pulse rate:   102 / minute BP sitting:   126 / 72  (right arm) Cuff size:   regular  Vitals Entered By: Arman Filter LPN (July 22, 2009 1:44 PM)  O2 Flow:  Room air CC: Pt is here for a post hosp f/u appt. Pt's caregivers state pt has been coughing up white sputum. Caregiver states "breathing is ok."  Denies temp.    Comments Unable to weigh pt.  Wheelchair bound. Medications reviewed with patient Arman Filter LPN  July 22, 2009 1:44 PM    Physical Exam  General:  wd male in nad Nose:  no drainage or purulence noted. Neck:  trach in place with clean site. Lungs:  a few rhonchi, but no crackles or wheezing no significant secretions Heart:  rrr, no mrg Abdomen:  soft and nontender,  PEG in place bs+ Extremities:  edema of hands and feet, but not excessive Neurologic:  alert, unable to answer questions or move extremities significantly.   Impression & Recommendations:  Problem # 1:  RESPIRATORY FAILURE, CHRONIC (ICD-518.83)  Chronic vent dep. respiratory failure secondary to ALS.  From a pulmonary standpoint he is doing well, with no persistent infection and adequate gas exchange.  However, his neurologic symptoms are progressing, and I suspect the family will need to consider palliative care in the near future.  Regarding his general care issues, I would not aggressively treat his blood sugars unless consistently greater than 200 given his prognosis.  The family is completely agreeable to this.  I think he does need adjustments to his caloric intake, and will have nutrition look at this.  F/u of his GIB issues per GI service.  Time spent with pt today was .  Other Orders: Est. Patient Level III (36644)  Patient Instructions: 1)  no change in vent settings 2)  will arrange for labwork at home, and to recheck cxr to evaluate prior pneumothorax. 3)  would check cbg's one to two times a week, but not worry about blood sugars unless consistently greater than 200. 4)  will have nutrition re-evaluate caloric needs and tube feed formulation. 5)  followup with me in one year or sooner if issues arise.    Appended Document: posthospital f/u for ALS/chronic RF bloodwork ok, cxr with no pneumothorax, and pna has almost totally gone away.  let them know that it is common for cxr to take 8weeks to clear, and that we go more by how the pt is doing.  no need for a followup cxr.  Appended Document: posthospital f/u for ALS/chronic RF Spoke with pt's wife and POA and gave results/recs per [facility].  She verbalized understanding.

## 2010-08-05 NOTE — Consult Note (Signed)
Summary: Breathing difficulties/[facility]  Breathing difficulties/[facility]   Imported By: Sherian Rein 07/23/2009 13:49:31  _____________________________________________________________________  External Attachment:    Type:   Image     Comment:   External Document

## 2010-08-05 NOTE — Progress Notes (Signed)
Summary: DIABETIC SUPPLIES  Phone Note Call from Patient   Caller: Significant Other [name] Call For: [facility] Summary of Call: NEED ORDER FOR DIABETIC SUPPLIES SENT TO [facility] / CALL HOME BEFORE CALLING [facility] Initial call taken by: Rickard Patience,  April 29, 2010 3:44 PM  Follow-up for Phone Call        Spoke with [name].  She states that the pt is out of dibetic testing suppplies.  She states that [facility] changed order from once a wk testing to twice per wk.  I advised that order will be sent to [facility] for this. Follow-up by: Vernie Murders,  April 29, 2010 4:05 PM

## 2010-08-05 NOTE — Progress Notes (Signed)
Summary: RX FOR RANITIDINE   ---- Converted from flag ---- ---- 07/12/2009 2:37 PM, Louis Meckel MD wrote: I think he has been switched to a PPI.  He does not need to take his ranitidine  ---- 07/12/2009 1:37 PM, Merri Ray CMA (AAMA) wrote: I recieved a refill request on Ranitidine to take 2 tsp via peg tube daily. Do I refill this. Its not on his med list. I dont see where we prescribed it. ------------------------------ DENIED RX

## 2010-08-05 NOTE — Progress Notes (Signed)
Summary: talk to nurse - MMW request   Phone Note Call from Patient   Caller: Significant Other allison ( nurse) Call For: clance Summary of Call: calling to talk to nurse about magic mouthwash Initial call taken by: Rickard Patience,  May 02, 2010 3:18 PM  Follow-up for Phone Call        called spoke with [facility] who states that w/ the weather change, pt has been having increased mucus production so they are having to suction pt out more frequently.  states that Dr. Pollyann Kennedy had previously prerscibed MMW (over a yr ago) and would like to know if [facility] would prescribe this so that pt may be swabbed out as needed.  please advise, thanks! Follow-up by: Boone Master CNA/MA,  May 02, 2010 3:36 PM  Additional Follow-up for Phone Call Additional follow up Details #1::        this is for his mouth right? not trach or trach site? if so, ok to fill.  can find out from pharmacy how much to give, and 6 refills ok.  I am not sure how much they plan on using. Additional Follow-up by: Barbaraann Share MD,  May 02, 2010 5:14 PM    Additional Follow-up for Phone Call Additional follow up Details #2::    apologies, yes the MMW is for pt's mouth not trache site.  called spoke with [facility] who states that Dr. Ellin Goodie had called and they went ahead and asked him for the MMW.  per [facility] this has already been called in.  MMW added to pt's med list. Boone Master CNA/MA  May 02, 2010 5:21 PM   New/Updated Medications: * MMW use as directed per Dr. Danielle Dess

## 2010-08-05 NOTE — Miscellaneous (Signed)
Summary: lab results from [facility] lab dated 12-29-2009   Clinical Lists Changes   received results from [facility] that were drawn 12-29-2009.  Put in KC's very important look at folder for him to review.  Aundra Millet Reynolds LPN  December 30, 2009 1:45 PM   Appended Document: lab results from [facility] lab dated 12-29-2009 let them know labs are fine.  Still waiting to hear about abg and vent settings.  Appended Document: lab results from [facility] lab dated 12-29-2009 called and spoke with pt's wife.  informed her of lab results.  wife vebalized understanding and denied any questions.    Appended Document: lab results from [facility] lab dated 12-29-2009 spoke with wife...she thinks he has panic attacks and gets dysynchronous with vent.  RT doesn't think there is a ventilatory problem.  He is doing better with increased anxiolytics.  Will not pursue abg or vent changes for right now.

## 2010-08-05 NOTE — Miscellaneous (Signed)
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  He was scoped by Dr. Christella Hartigan with injection of duodenal ulcer and had no rebleeds.  He was treated with Zosyn for his PNA and improved.  He was d/c'ed on Ertapenem x 5 days.  Followup needed: Pt will need to have a follow up appt scheduled with Dr. Shelle Iron his pulmonologist.  (Scheduled: 07/22/09 at 9:00 with Dr. Shelle Iron)  Also, pt has no true PCP and his wife was advised to get him a PCP to manage all of his issues.    The medication and problem lists have been updated.  Please see the dictated discharge summary for details.   Prescriptions: INVANZ 1 GM SOLR (ERTAPENEM SODIUM) Take 1 gram IV once daily for 5 days.  #5 gm x 0   Entered and Authorized by:   Brooks Sailors MD   Signed by:   Brooks Sailors MD on 07/06/2009   Method used:   Handwritten   RxID:   [id number] NEXIUM 40 MG PACK (ESOMEPRAZOLE MAGNESIUM) Take 1 tab by G-tube twice daily for 4 weeks then once daily  #60 x 0   Entered and Authorized by:   Brooks Sailors MD   Signed by:   Brooks Sailors MD on 07/06/2009   Method used:   Handwritten   RxID:   [id number]    Patient Instructions: 1)  You will be called about a follow up appointment with Dr. Shelle Iron.  If you do not here from his office please call them and schedule a follow up. 2)  You need to have a primary care physician who can help you to manage your health and coordinate with your multiple specialists. 3)  You will be taking a new medicine called nexium 40 mg by G-tube twice daily for 4 weeks and then daily thereafter. Stop taking ranitidine.  4)  Stop taking your aspirin until your doctor tells you to restart it.  5)  You will be taking Ertapenem, 1gram IV once daily for 5 days. 6)  If you have a more very dark tarry stools, fevers,  more difficulty breathing please call or come to the ED.

## 2010-08-05 NOTE — Miscellaneous (Signed)
Summary: D/C Trach Trial/[facility]  D/C Trach Trial/[facility]   Imported By: Sherian Rein 02/06/2010 11:29:24  _____________________________________________________________________  External Attachment:    Type:   Image     Comment:   External Document

## 2010-08-05 NOTE — Progress Notes (Signed)
Summary: Marcelle Smiling   Phone Note From Other Clinic Call back at [phone]   Caller: Nurse Cecil Cobbs Call For: Arlyce Dice Reason for Call: Schedule Patient Appt Summary of Call: nurse states that pt had black stool today wants to speak to Gavin Pound about it. Initial call taken by: Tawni Levy,  July 09, 2009 12:59 PM  Follow-up for Phone Call        Pt. had a black stool today, had a darker stool yesterday. Prevacid restarted Sunday. Was released from the hospital on Sunday from a GI bleed.  DR.Vivan Agostino PLEASE ADVISE  Follow-up by: Laureen Ochs LPN,  July 09, 2009 1:15 PM  Additional Follow-up for Phone Call Additional follow up Details #1::        Set him up for  EGD at 4:30 at  [facility] Additional Follow-up by: Louis Meckel MD,  July 09, 2009 1:35 PM    Additional Follow-up for Phone Call Additional follow up Details #2::    Pt. is scheduled for an Endo. at [facility] today at 4:30pm, all instructions reviewed with Mrs.Sabino Gasser and [place] by phone.  [facility] booking# is [phone])  Follow-up by: Laureen Ochs LPN,  July 09, 2009 2:56 PM

## 2010-08-05 NOTE — Progress Notes (Signed)
Summary: order  Phone Note Call from Patient   Caller: Significant Other nurse vickie Call For: [name] Summary of Call: need order to stop trach collar trial when he is in chair. Initial call taken by: [name],  January 28, 2010 1:37 PM  Follow-up for Phone Call        called and spoke with pt's caretaker, [name].  [name] states she was doing pt's Plan of Care and came across some old orders regarding doing "trach trials" on pt.  [name] states she needs an order from [facility] to d/c these "trach trials".  Pt hasn't done them in a long time and cannot tolerate them.  will forward message to [facility] to address.  Aundra Millet Reynolds LPN  January 28, 2010 4:20 PM  Follow-up by: Barbaraann Share MD,  January 29, 2010 5:38 PM  Additional Follow-up for Phone Call Additional follow up Details #1::        ok with me. Additional Follow-up by: Barbaraann Share MD,  January 29, 2010 5:38 PM    Additional Follow-up for Phone Call Additional follow up Details #2::    Spoke with [name] and gave verbal order to d/c trach trials per [facility]. Follow-up by: Vernie Murders,  January 30, 2010 8:46 AM

## 2010-08-06 ENCOUNTER — Telehealth: Payer: Self-pay | Admitting: Pulmonary Disease

## 2010-08-08 NOTE — Miscellaneous (Signed)
Summary: Plan/[facility]  Plan/[facility]   Imported By: Lester Woodland 10/02/2009 09:49:01  _____________________________________________________________________  External Attachment:    Type:   Image     Comment:   External Document

## 2010-08-13 NOTE — Progress Notes (Signed)
Summary: blood in trach  Phone Note Call from Patient   Caller: vicki- home health nurse for pt Call For: clance Summary of Call: caller states that the night nurse (last night) had reported that pt had small clumps of "old blood" (dark in color) removed from secretions ins trach. today nurse vivky states that she found the same when suctioning pt. vicky # [phone] Initial call taken by: Tivis Ringer, CNA,  August 06, 2010 10:19 AM  Follow-up for Phone Call        Spoke with Lynden Ang and she states last night the PM nurse documented that the pt had some old brownish blood in his secretions, and this happened againthis morning as well. Lynden Ang states that the pt does not have any congestion, fever, and he is not needing to be suctioned very often. SHe states there is no blood around the trach and no blood in the pt mouth either. Lynden Ang states that the house has been hot and dry over the last few days so she thinks this might have something to do with it. Pt has appt on March 6th.  They do not want to coem in now due to exposing the pt to other sick patients.  Please advise.  Carron Curie CMA  August 06, 2010 11:43 AM   Additional Follow-up for Phone Call Additional follow up Details #1::        as long as trach site looks ok and no nasty secretions, agree it is probably due to dry air.  Would just watch for now. Additional Follow-up by: Barbaraann Share MD,  August 06, 2010 3:35 PM    Additional Follow-up for Phone Call Additional follow up Details #2::    vicki is aware of recs.Carron Curie CMA  August 06, 2010 3:42 PM

## 2010-08-20 ENCOUNTER — Telehealth: Payer: Self-pay | Admitting: Gastroenterology

## 2010-08-20 ENCOUNTER — Telehealth (INDEPENDENT_AMBULATORY_CARE_PROVIDER_SITE_OTHER): Payer: Self-pay | Admitting: *Deleted

## 2010-08-21 ENCOUNTER — Emergency Department (HOSPITAL_COMMUNITY): Payer: Medicare Other

## 2010-08-21 ENCOUNTER — Telehealth: Payer: Self-pay | Admitting: Pulmonary Disease

## 2010-08-21 ENCOUNTER — Emergency Department (HOSPITAL_COMMUNITY)
Admission: EM | Admit: 2010-08-21 | Discharge: 2010-08-21 | Disposition: A | Discharge status: Home / Self Care | Payer: Medicare Other | Attending: Emergency Medicine | Admitting: Emergency Medicine

## 2010-08-21 DIAGNOSIS — Z79899 Other long term (current) drug therapy: Secondary | ICD-10-CM | POA: Insufficient documentation

## 2010-08-21 DIAGNOSIS — R0602 Shortness of breath: Secondary | ICD-10-CM | POA: Insufficient documentation

## 2010-08-21 DIAGNOSIS — G1221 Amyotrophic lateral sclerosis: Secondary | ICD-10-CM | POA: Insufficient documentation

## 2010-08-21 DIAGNOSIS — J9600 Acute respiratory failure, unspecified whether with hypoxia or hypercapnia: Secondary | ICD-10-CM | POA: Insufficient documentation

## 2010-08-21 DIAGNOSIS — J9383 Other pneumothorax: Secondary | ICD-10-CM | POA: Insufficient documentation

## 2010-08-21 LAB — DIFFERENTIAL
Eosinophils Absolute: 0.1 10*3/uL (ref 0.0–0.7)
Eosinophils Relative: 0 % (ref 0–5)
Lymphs Abs: 0.8 10*3/uL (ref 0.7–4.0)
Monocytes Relative: 4 % (ref 3–12)

## 2010-08-21 LAB — POCT CARDIAC MARKERS: Troponin i, poc: <0.05 ng/mL (ref 0.00–0.09)

## 2010-08-21 LAB — CBC
MCH: 27 pg (ref 26.0–34.0)
MCHC: 33.8 g/dL (ref 30.0–36.0)
MCV: 79.8 fL (ref 78.0–100.0)
Platelets: 276 10*3/uL (ref 150–400)
RDW: 16.7 % — ABNORMAL HIGH (ref 11.5–15.5)

## 2010-08-21 LAB — COMPREHENSIVE METABOLIC PANEL
Alkaline Phosphatase: 102 U/L (ref 39–117)
BUN: 11 mg/dL (ref 6–23)
Chloride: 101 mEq/L (ref 96–112)
Glucose, Bld: 202 mg/dL — ABNORMAL HIGH (ref 70–99)
Potassium: 3.6 mEq/L (ref 3.5–5.1)
Total Bilirubin: 0.3 mg/dL (ref 0.3–1.2)

## 2010-08-21 LAB — TYPE AND SCREEN
ABO/RH(D): A POS
Antibody Screen: NEGATIVE

## 2010-08-21 LAB — OCCULT BLOOD, POC DEVICE: Fecal Occult Bld: NEGATIVE

## 2010-08-22 ENCOUNTER — Telehealth (INDEPENDENT_AMBULATORY_CARE_PROVIDER_SITE_OTHER): Payer: Self-pay | Admitting: *Deleted

## 2010-08-23 ENCOUNTER — Encounter: Payer: Self-pay | Admitting: Pulmonary Disease

## 2010-08-25 ENCOUNTER — Telehealth (INDEPENDENT_AMBULATORY_CARE_PROVIDER_SITE_OTHER): Payer: Self-pay | Admitting: *Deleted

## 2010-08-26 ENCOUNTER — Ambulatory Visit (INDEPENDENT_AMBULATORY_CARE_PROVIDER_SITE_OTHER): Payer: Medicare Other | Admitting: Pulmonary Disease

## 2010-08-26 ENCOUNTER — Telehealth: Payer: Self-pay | Admitting: Gastroenterology

## 2010-08-26 ENCOUNTER — Telehealth: Payer: Self-pay | Admitting: Pulmonary Disease

## 2010-08-26 ENCOUNTER — Encounter: Payer: Self-pay | Admitting: Pulmonary Disease

## 2010-08-26 DIAGNOSIS — G1221 Amyotrophic lateral sclerosis: Secondary | ICD-10-CM

## 2010-08-26 DIAGNOSIS — J9610 Chronic respiratory failure, unspecified whether with hypoxia or hypercapnia: Secondary | ICD-10-CM

## 2010-08-26 NOTE — Consult Note (Signed)
  NAMEJERMAYNE, SWEENEY             ACCOUNT NO.:  [id number]  MEDICAL RECORD NO.:  [id number]           PATIENT TYPE:  LOCATION:                                 FACILITY:  PHYSICIAN:  Welda Azzarello H. Pollyann Kennedy, MD     DATE OF BIRTH:  August 31, 1955  DATE OF CONSULTATION:  08/21/2010 DATE OF DISCHARGE:                                CONSULTATION   REASON FOR CONSULTATION:  Bleeding from tracheostomy.  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  About 2 weeks ago, his respiratory therapist noticed when they suctioned him he would have some fresh blood.  Earlier today, he had a mucous plug that was suctioned out and while it was in place it was causing some dyspnea and respiratory distress, but that resolved immediately after the mucous plug was suctioned.  He was taken to the emergency department, evaluated and found to have a small pneumothorax, which he has had before and I was consulted about the bloody trach secretions.  EXAMINATION:  He is tracheostomy dependent, ventilator dependent, and unable to communicate.  He is in no distress.  The neck looks healthy and clear.  There is no blood staining.  The tracheostomy looks as though it is in place.  Flexible fiberoptic examination was performed through the tracheostomy. At the distal end of the trach, posteriorly there is some granulation tissue in the tracheal wall just beyond the tip of the tube.  The tube seems to be pressing up against the posterior tracheal wall at dislocation.  When I manipulated the tracheostomy and hold the external part more superiorly the tip came away from the tracheal wall and then laid in a nice position without any irritation of the trachea.  There was no erosion seen.  There was no bleeding seen.  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  I discussed this with the wife and the respiratory therapist and we will have them support the external part of the tube up more superiorly to pull the tip away from the posterior tracheal wall. This should solve this problem.  They will follow up with me if he has any additional problems.     Itzamar Traynor H. Pollyann Kennedy, MD     JHR/MEDQ  D:  08/21/2010  T:  08/22/2010  Job:  [id number]  Electronically Signed by Serena Colonel MD on 08/26/2010 10:51:41 AM

## 2010-08-27 ENCOUNTER — Encounter: Payer: Self-pay | Admitting: Pulmonary Disease

## 2010-08-27 NOTE — Progress Notes (Signed)
Summary: blood when suctioning trach<<<minimize suctioning  Phone Note Other Incoming Call back at Home Phone [phone]   Caller: [name] home health nurse Summary of Call: [name] Mr. Mancera home health nurse phoned stated that yesterday when they suctioned his trach it had bright red blood in it and it stopped last night but started again this morning. Kerry [place] can be reached at [phone] Initial call taken by: [name],  August 20, 2010 10:10 AM  Follow-up for Phone Call        [name] c/o pt having bright red blood in secretions during suctioning intermittent since Sunday. His mornings secretions had more blood than mucus. Caller wants to know if pt needs to come in. No other complaints. Did have epigastric pain and spoke with Dr. Marzetta Board office. Please advise. Thanks. Zackery Barefoot CMA  August 20, 2010 12:12 PM   Additional Follow-up for Phone Call Additional follow up Details #1::        would check trach site and see if infected or really inflammed.  If not, would try and minimize suctioning as much as possible since sometimes there can be airway trauma that has to "cool off".  Let us know if worsening. Additional Follow-up by: Barbaraann Share MD,  August 20, 2010 1:44 PM    Additional Follow-up for Phone Call Additional follow up Details #2::    Lynden Ang says that the site does not look infected and is not red. She will try to minimize suctioning per Dr. Teddy Spike recs and see how the pt does. She will call if sxs do not improve or get worse.Michel Bickers [facility]  August 20, 2010 3:39 PM

## 2010-08-27 NOTE — Progress Notes (Signed)
Summary: speak to nurse  Phone Note Call from Patient Call back at Home Phone [phone]   Caller: Revonda Standard taylor//nurse Call For: clance Reason for Call: Talk to Nurse Summary of Call: Wants to speak with nurse to give update on pt and also ask a few questions. Initial call taken by: Darletta Moll,  August 22, 2010 9:11 AM  Follow-up for Phone Call        allison/nurse calling to give update on pt.  He is doing very well, resting well, sats staying around 98%, breath sounds pretty clear, vitals normal, has needed very little suction saw a a little blood earlier but none since.cxr scheduled for tomorrow-with [facility] questions are: 1) since he has pneumothorax do you want any chest PT, pt has the vest but can do manuel if this is needed? 2)is it ok to get him up with lift into the scooter? 3)when does pt need f/u appt shift change is now new nurse coming in is jessica--pls advise of above and we can call them back Follow-up by: Philipp Deputy CMA,  August 22, 2010 3:18 PM  Additional Follow-up for Phone Call Additional follow up Details #1::        make sure they send me a copy of his cxr report doesn't need chest pt unless having issues with secretions ok to get in scooter he is due for a f/u at any time with me. Additional Follow-up by: Barbaraann Share MD,  August 22, 2010 5:47 PM    Additional Follow-up for Phone Call Additional follow up Details #2::    spoke to the nurse an gave her all responses, also scheduled pt to see kc for tuesday 2/21 at 12:00 Follow-up by: Philipp Deputy CMA,  August 22, 2010 6:07 PM

## 2010-08-27 NOTE — Progress Notes (Signed)
----   Converted from flag ---- ---- 08/21/2010 11:45 AM, Oneita Jolly wrote: rate 12, title volume 650cc, peep of 5 ra, and peak pressures 52 kiters per min call dave if you need any changes [phone]  ---- 08/21/2010 9:52 AM, Barbaraann Share MD wrote: please call his dme and get his current vent settings with most recent peak pressures.Marland KitchenMarland KitchenI think [name] sees them?? ------------------------------  called and talked with RT...his peak pressure have been in the 20's on his current vent settings, but last night went into 50's.  THe Er felt he had mucus plug, and is much better after suctioning.  His cxr showed 5%ptx...have asked for f/u cxr at home in 48hrs, and to call if having further problems.  Also asked rt to decrease tv to 600c.Marland Kitchen

## 2010-08-27 NOTE — Progress Notes (Signed)
Summary: triage   Phone Note Call from Patient Call back at Home Phone [phone]   Caller: [name]  home health nurse Call For: Dr Arlyce Dice Reason for Call: Talk to Nurse Summary of Call: [name] states that sthe patient is having epigastric pain/burning. Initial call taken by: Tawni Levy,  August 20, 2010 10:12 AM  Follow-up for Phone Call        Patient is complaining of some epgastric burning and discomfort. He is taking Prevacid solutab 30mg  two times a day via G tube. Asked if patient was taking Nexium 40mg  and home health nurse states they are unable to give this via the G tube. Dr. Arlyce Dice please advise. Follow-up by: Selinda Michaels RN,  August 20, 2010 11:15 AM  Additional Follow-up for Phone Call Additional follow up Details #1::        Add mylanta II as needed for burning.  c/b if not improved Additional Follow-up by: Louis Meckel MD,  August 20, 2010 11:46 AM    Additional Follow-up for Phone Call Additional follow up Details #2::    Spoke with home health nurse and let her know Dr. Marzetta Board recommendations. Follow-up by: Selinda Michaels RN,  August 20, 2010 11:59 AM

## 2010-08-28 ENCOUNTER — Encounter: Payer: Self-pay | Admitting: Gastroenterology

## 2010-09-02 ENCOUNTER — Encounter: Payer: Self-pay | Admitting: Pulmonary Disease

## 2010-09-02 NOTE — Progress Notes (Signed)
Summary: xray results from [facility] on Saturday  Phone Note From Other Clinic Call back at Home Phone [phone]   Caller: Tyrell Antonio with [facility] Call For: clance Summary of Call: [name] phoned stated that [facility] came to his home on Saturday for chest xrays patient has an appointment tomorrow she wanted to know if we received the xray results yet. Please call back as soon as possible so it we have not received them she can call and have them faxed. She can be reached at [phone] Initial call taken by: [name],  August 25, 2010 3:14 PM  Follow-up for Phone Call        called and spoke with pt's caregiver and informed her that yes, we did receive cxr report.  nothing further needed.  Aundra Millet Reynolds LPN  August 25, 2010 4:58 PM

## 2010-09-02 NOTE — Progress Notes (Addendum)
Summary: regarding doppler study  Phone Note From Other Clinic Call back at Home Phone [phone]   Caller: [name] pts nurse Call For: [name] Summary of Call: [name] patients nurse phoned stated that they just left the office seeing Dr [name], Dr [name] wrote for patient to have a venous doppler study she wants to know if they can call anywhere to have this or is there a particular one that he wants them to use. she can be reached at [phone] Initial call taken by: [name],  August 26, 2010 1:50 PM  Follow-up for Phone Call        Spoke with pt's nurse.  She states that she has called and found a facility that can come to the pt's [place] to do dopplers call [facility].  Wants to know if this is okay with KC.  Pls advise thanks Follow-up by: Vernie Murders,  August 26, 2010 3:04 PM  Additional Follow-up for Phone Call Additional follow up Details #1::        as long as it is a reputable company, I am ok with it.  the other option is to go to [facility], but I know that is a lot of trouble Additional Follow-up by: Barbaraann Share MD,  August 26, 2010 5:50 PM    Additional Follow-up for Phone Call Additional follow up Details #2::    Spoke with pt wife and she states this is the same company that does the pt xrays. They are scheduled to come  to do doppler later today and they will fax results. Carron Curie CMA  August 27, 2010 9:51 AM    Appended Document: regarding doppler study megan, see if we ever got the results of his doppler exam?  Appended Document: regarding doppler study [id number]  Appended Document: regarding doppler study pt's spouse, [name], called back on her cell # [phone].  [name] states pt had dopplers done through [facility].  Called [facility] at [phone] and requested results.  received results and put in kc's very important look at folder for him to review.   Appended Document: regarding doppler study no dvt seen make  sure pt's family knows this.  Appended Document: regarding doppler study see above  Appended Document: regarding doppler study called and spoke with pt's wife, [name], and informed her of doppler results.

## 2010-09-02 NOTE — Progress Notes (Signed)
Summary: Pantoprazole/Gtube Send Carafate   Phone Note From Pharmacy Call back at [facility] [phone]   [facility]  [place] [address] Reason for Call: Patient requests substitution Summary of Call: Dr Arlyce Dice, Pharmacy contacted the office, pts Prevacid solutab in Generic is on backorder. And NameBrand is to expensive..They state that pt is requesting to try Pantoprazole.Marland KitchenMarland KitchenMarland KitchenBut I dont think he is able to take this medication because he has a gtube.Marland KitchenMarland KitchenPlease advise Initial call taken by: Merri Ray CMA Duncan Dull),  August 26, 2010 3:38 PM  Follow-up for Phone Call         I am not aware of another PPI that comes in liquid form. he can try Carafate solution 1 g 4 times a day in liew of his PPI Follow-up by: Louis Meckel MD,  August 27, 2010 9:25 AM  Additional Follow-up for Phone Call Additional follow up Details #1::        Tried to call pt to discuss his medication. No Answer Additional Follow-up by: Merri Ray CMA Duncan Dull),  August 27, 2010 4:58 PM    Additional Follow-up for Phone Call Additional follow up Details #2::    Spoke with robin, Pts wife about substituting PPI with the Carafate until pts Prevacid solutab becomes available. [facility] [place].  Follow-up by: Merri Ray CMA Duncan Dull),  August 28, 2010 2:23 PM  Additional Follow-up for Phone Call Additional follow up Details #3:: Details for Additional Follow-up Action Taken: [facility] of Carafate for pt to use until the PPI comes in. Pharmacist said medication is on back order and should be in in about 4 weeks Additional Follow-up by: Merri Ray CMA Duncan Dull),  August 28, 2010 2:29 PM

## 2010-09-09 ENCOUNTER — Ambulatory Visit: Payer: Self-pay | Admitting: Pulmonary Disease

## 2010-09-11 NOTE — Miscellaneous (Signed)
Summary: Medication orders/[facility]  Medication orders/[facility]   Imported By: Sherian Rein 09/01/2010 12:55:13  _____________________________________________________________________  External Attachment:    Type:   Image     Comment:   External Document

## 2010-09-11 NOTE — Assessment & Plan Note (Signed)
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  Family member states pt's tital volume was decreased down to 600.  Prev was at 650.  Family member states pt has yellow tinged sputum with occ small amounts of bright red blood steaked in with sputum. [name]  History of Present Illness: The pt comes in today for pulmonary f/u after a long hiatus.  He has known vent dep ALS, and has been doing fairly well over the years due to meticulous care by his family and home health services.  He has gotten to the point where he cannot move any of his extremities, and can somewhat communicate with limited eye movement.  He has not had any recent significant pulmonary infection.  He recently went to ER where he was found to have a 5%  right apical ptx after an episode of mucus plugging.  He had a f/u film days later that showed resolution of this.  He does have significant basilar atx.  I did arrange to have his TV decreased to 600 due to concerns about PTX and increased peak airway pressures.  The pt has minimal yellow tinged mucus, and on occasion a streak of hemoptysis (prob from suctioning).  He has had some unilateral LE edema that needs to be addressed.  Current Medications (verified): 1)  Acuvail 0.45 % Soln (Ketorolac Tromethamine) .[name].. 1 Drop Both Eyes Two Times A Day 2)  Cyanocobalamin 1000 Mcg/ml Soln (Cyanocobalamin) .... Injection Twice A Week 3)  Clarinex 0.5 Mg/ml Syrp (Desloratadine) .... Give 5mg  (10ml) Once Daily Via G Tube 4)  Baclofen 10 Mg Tabs (Baclofen) .... Take 1 Tab By G-Tube Three Times A Day 5)  Effexor 75 Mg  Tabs (Venlafaxine Hcl) .... Take 1 Tablet By Mouth Three Times A Day 6)  Klonopin 0.5 Mg  Tabs (Clonazepam) .... Take 1 Tablet By Mouth Two Times A Day 7)  Prevacid Solutab 30 Mg Tbdp (Lansoprazole) .[name].. 1 Tablet in G-Tube Two Times A Day 8)  Miralax  Powd (Polyethylene Glycol 3350) .[name].. 17gm in 8oz. Water or Juice Daily.  May Use As Many Times As Needed  For Constipation 9)  Multivitamin & Mineral  Liqd (Multiple Vitamins-Minerals) .... Take 2 Tbsp By G-Tube Daily 10)  Fish Oil  Oil (Fish Oil) .... 5ml Per G-Tube Three Times A Day 11)  Restoril 30 Mg Caps (Temazepam) .... Take 1 Capsule By G-Tube At Bedtime 12)  Systane 0.4-0.3 % Soln (Polyethyl Glycol-Propyl Glycol) .[name].. 1 Drop Both Eyes Every 1-2hrs 13)  Vitamin B-12 500 Mcg Tabs (Cyanocobalamin) .... 2 Tabs Via G Tube Daily 14)  Zyrtec Childrens Allergy 1 Mg/ml Syrp (Cetirizine Hcl) .... 2  Tsp By G-Tube Once Daily Two Times A Day 15)  Benadryl 12.5/69ml .... Give 25mg  (10ml) As Needed For Itching 16)  Delsym 30 Mg/71ml  Lqcr (Dextromethorphan Polistirex) .... Every 12 Hours As Needed 17)  Gaviscon .... As Needed For Upset Stomach or Gas 18)  Lorazepam 0.5 Mg  Tabs (Lorazepam) .... Take 1 Tablet By Mouth Two Times A Day As Needed 19)  Lortab 5 5-500 Mg  Tabs (Hydrocodone-Acetaminophen) .... Take 1 Tablet By Mouth Every4 Hours As Needed For Pain 20)  Milk of Magnesia 400mg /22ml .... As Needed For Constipation 21)  Nystatin 100000 Unit/ml Susp (Nystatin) .... Swab Mouth With 1 Tsp Four Times A Day As Needed For Mouth Sores 22)  Tylenol Extra Strength 500 Mg Tabs (Acetaminophen) .Gregory KitchenMarland KitchenMarland [name]  As Needed.  No More Than 6 Tabs in A 24 Hour Period 23)  Benefiber   Tabs (Wheat Dextrin) .... Take 1 Tablet By Mouth Once A Day 24)  Pulmocare   Liqd (Nutritional Supplements) 25)  Ipratropium Bromide 0.02 %  Soln (Ipratropium Bromide) .... Four Times A Day 26)  Albuterol Sulfate (2.5 Mg/85ml) 0.083%  Nebu (Albuterol Sulfate) .... Every 4 Hours As Needed 27)  Xylocaine 2 % Soln (Lidocaine Hcl) .... Applied Topically To Trach Stoma Site As Needed 28)  Bacitracin 500 Unit/gm Oint (Bacitracin) .... Apply Liberally To Affected Area Daily (G Tube) 29)  Debrox 6.5 % Soln (Carbamide Peroxide) .... Instill 2 To 3 Drops in Affected Ears As Needed For Wax Removal  Allergies: 1)  ! Levaquin 2)  ! Septra 3)  ! Bentyl 4)  ! *  Omnicef 5)  ! Elavil 6)  ! Cephalosporins 7)  ! * Glutathione  Past History:  Past medical, surgical, family and social histories (including risk factors) reviewed, and no changes noted (except as noted below).  Past Medical History: ALS--vent dependent  Review of Systems       The patient complains of productive cough, coughing up blood, hand/feet swelling, and change in color of mucus.  The patient denies shortness of breath with activity, shortness of breath at rest, non-productive cough, chest pain, irregular heartbeats, acid heartburn, indigestion, loss of appetite, weight change, abdominal pain, difficulty swallowing, sore throat, tooth/dental problems, headaches, nasal congestion/difficulty breathing through nose, sneezing, itching, ear ache, anxiety, depression, joint stiffness or pain, rash, and fever.    Vital Signs:  Patient profile:   55 year old male O2 Sat:      93 % on Room air Temp:     97.6 degrees F oral Pulse rate:   80 / minute BP sitting:   148 / 80  (left arm) Cuff size:   regular  Vitals Entered By: Arman Filter LPN (August 26, 2010 11:47 AM)  O2 Flow:  Room air CC: Overdue for a f/u appt.  Family member states pt's tital volume was decreased down to 600.  Prev was at 650.  Family member states pt has yellow tinged sputum with occ small amounts of bright red blood steaked in with sputum.  Comments Unable to weigh pt.  Wheelchair bound. Medications reviewed with patient Arman Filter LPN  August 26, 2010 11:47 AM    Physical Exam  General:  wd male in nad  unable to communicate Nose:  no purulence or discharge noted. Neck:  clean trach site, trach appears to be functioning properly Lungs:  a few decreased bs in the bases, o/w clear Heart:  rrr Extremities:  LLE edema, no calf tenderness no cyanosis Neurologic:  unable to move extremities, awake and alert.   Impression & Recommendations:  Problem # 1:  RESPIRATORY FAILURE, CHRONIC  (ICD-518.83)  the pt has chronic vent dep respiratory failure due to ALS.  He has adequate oxygenation, secretion control, and no evidence for pulmonary infection at this time.  He did have recent ptx that has resolved after what sounds like mucus plugging with "ball valve effect"  I have turned down his tidal volume, but I remained concerned about worsening his LL atx with this.  Will see how he does.  He has clearly lost a lot of neuromuscular function, and I expect him to start having more pulmonary complications over time.  If mucus plugging becomes more of an issue for him, would consider discontinuing atrovent  in his nebulizer treatments.    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 1 drop both eyes two times a day 2)  Cyanocobalamin 1000 Mcg/ml Soln (Cyanocobalamin) .... Injection twice a week 3)  Clarinex 0.5 Mg/ml Syrp (Desloratadine) .... Give 5mg  (10ml) once daily via g tube 4)  Vitamin B-12 500 Mcg Tabs (Cyanocobalamin) .... 2 tabs via g tube daily 5)  Benadryl 12.5/98ml  .... Give 25mg  (10ml) as needed for itching 6)  Gaviscon  .... As needed for upset stomach or gas 7)  Milk of Magnesia 400mg /15ml  .... As needed for constipation 8)  Nystatin 100000 Unit/ml Susp (Nystatin) .... Swab mouth with 1 tsp four times a day as needed for mouth sores 9)  Tylenol Extra Strength 500 Mg Tabs (Acetaminophen) .... As needed.  no more than 6 tabs in a 24 hour period 10)  Pulmocare Liqd (Nutritional supplements) 11)  Xylocaine 2 % Soln (Lidocaine hcl) .... Applied topically to trach stoma site as needed 12)  Bacitracin 500 Unit/gm Oint (Bacitracin) .... Apply liberally to affected area daily (g tube) 13)  Debrox 6.5 % Soln (Carbamide peroxide) .... Instill 2 to 3 drops in affected ears as needed for wax removal  Other Orders: Est. Patient Level IV  (91478)  Patient Instructions: 1)  ok to restart chest pt per vibratory vest 2)  leave Tv at 600cc for now. 3)  will check lower extremity venous dopplers to make sure no blood clots.

## 2010-09-15 LAB — CBC
HCT: 42.4 % (ref 39.0–52.0)
Hemoglobin: 14.1 g/dL (ref 13.0–17.0)
MCHC: 33.3 g/dL (ref 30.0–36.0)
MCV: 79.8 fL (ref 78.0–100.0)
RDW: 17 % — ABNORMAL HIGH (ref 11.5–15.5)
WBC: 8.7 10*3/uL (ref 4.0–10.5)

## 2010-09-16 NOTE — Miscellaneous (Signed)
Summary: Equipment/[facility]  Equipment/[facility]   Imported By: Lester Jupiter 09/12/2010 08:32:42  _____________________________________________________________________  External Attachment:    Type:   Image     Comment:   External Document

## 2010-09-16 NOTE — Letter (Signed)
Summary: CMN[facility]  [facility]   Imported By: Lester Quitman 09/12/2010 08:39:09  _____________________________________________________________________  External Attachment:    Type:   Image     Comment:   External Document

## 2010-09-18 ENCOUNTER — Telehealth: Payer: Self-pay | Admitting: Gastroenterology

## 2010-09-19 ENCOUNTER — Telehealth (INDEPENDENT_AMBULATORY_CARE_PROVIDER_SITE_OTHER): Payer: Self-pay | Admitting: *Deleted

## 2010-09-21 LAB — BASIC METABOLIC PANEL
BUN: 3 mg/dL — ABNORMAL LOW (ref 6–23)
BUN: 4 mg/dL — ABNORMAL LOW (ref 6–23)
BUN: 4 mg/dL — ABNORMAL LOW (ref 6–23)
BUN: 6 mg/dL (ref 6–23)
CO2: 24 mEq/L (ref 19–32)
CO2: 25 mEq/L (ref 19–32)
Calcium: 8.2 mg/dL — ABNORMAL LOW (ref 8.4–10.5)
Calcium: 8.6 mg/dL (ref 8.4–10.5)
Chloride: 107 mEq/L (ref 96–112)
Chloride: 108 mEq/L (ref 96–112)
Creatinine, Ser: <0.3 mg/dL — ABNORMAL LOW (ref 0.4–1.5)
Creatinine, Ser: <0.3 mg/dL — ABNORMAL LOW (ref 0.4–1.5)
Creatinine, Ser: <0.3 mg/dL — ABNORMAL LOW (ref 0.4–1.5)
Glucose, Bld: 217 mg/dL — ABNORMAL HIGH (ref 70–99)
Glucose, Bld: 226 mg/dL — ABNORMAL HIGH (ref 70–99)
Glucose, Bld: 234 mg/dL — ABNORMAL HIGH (ref 70–99)
Sodium: 138 mEq/L (ref 135–145)

## 2010-09-21 LAB — CBC
HCT: 28.1 % — ABNORMAL LOW (ref 39.0–52.0)
HCT: 29.2 % — ABNORMAL LOW (ref 39.0–52.0)
Hemoglobin: 10.1 g/dL — ABNORMAL LOW (ref 13.0–17.0)
Hemoglobin: 9.9 g/dL — ABNORMAL LOW (ref 13.0–17.0)
MCHC: 34.5 g/dL (ref 30.0–36.0)
MCHC: 35.3 g/dL (ref 30.0–36.0)
MCV: 97.3 fL (ref 78.0–100.0)
MCV: 97.9 fL (ref 78.0–100.0)
Platelets: 258 10*3/uL (ref 150–400)
Platelets: 327 10*3/uL (ref 150–400)
Platelets: 375 10*3/uL (ref 150–400)
RDW: 17.1 % — ABNORMAL HIGH (ref 11.5–15.5)
RDW: 17.2 % — ABNORMAL HIGH (ref 11.5–15.5)
WBC: 10.3 10*3/uL (ref 4.0–10.5)
WBC: 8.4 10*3/uL (ref 4.0–10.5)

## 2010-09-21 LAB — MAGNESIUM: Magnesium: 2.2 mg/dL (ref 1.5–2.5)

## 2010-09-21 LAB — GLUCOSE, CAPILLARY
Glucose-Capillary: 123 mg/dL — ABNORMAL HIGH (ref 70–99)
Glucose-Capillary: 151 mg/dL — ABNORMAL HIGH (ref 70–99)
Glucose-Capillary: 183 mg/dL — ABNORMAL HIGH (ref 70–99)
Glucose-Capillary: 265 mg/dL — ABNORMAL HIGH (ref 70–99)
Glucose-Capillary: 278 mg/dL — ABNORMAL HIGH (ref 70–99)

## 2010-09-23 NOTE — Progress Notes (Signed)
Summary: nurse update--left sided chest pain, congestion--rx sent  Phone Note Call from Patient   Caller: nurse Call For: clance Reason for Call: Talk to Nurse Summary of Call: Calling to give update on patient.  States SATs 94-96, vitals stable, but very congested.  Nurse gave neb treatment and chest pt.  She did get one secretion that very greenish in color.  Patient is also c/o left sided chest pain. Initial call taken by: Lehman Prom,  September 19, 2010 11:05 AM  Follow-up for Phone Call        Called and spoke with the nurse and she just wanted to inform Dr. Shelle Iron on how pt was doing. Nurse states though he has been complaining of left sided chest pain and sounds congested. Although pt vitals has been good and o2 has been staying in the 90's. Will send to Dr. Shelle Iron to see if he has any recommendations. Carver Fila  September 19, 2010 11:54 AM   Additional Follow-up for Phone Call Additional follow up Details #1::        lets treat him as an acute bronchitis, with cipro 500mg  two times a day for 7days. Additional Follow-up by: Barbaraann Share MD,  September 19, 2010 3:02 PM    Additional Follow-up for Phone Call Additional follow up Details #2::    Called and spoke with vicky(nurse) and informed her of kc's recs and that rx was sent. She verbalized understanding Mindy Edward Jolly  September 19, 2010 3:06 PM   New/Updated Medications: CIPRO 500 MG TABS (CIPROFLOXACIN HCL) 1 tablet two times a day x 7 days Prescriptions: CIPRO 500 MG TABS (CIPROFLOXACIN HCL) 1 tablet two times a day x 7 days  #14 x 0   Entered by:   Carver Fila   Authorized by:   Barbaraann Share MD   Signed by:   Carver Fila on 09/19/2010   Method used:   [facility]  [place] [address]       [address] [place] [address]       [place], [place]  [address]       Ph: [phone] or [phone]       Fax: [phone]   RxID:   [id number]

## 2010-09-23 NOTE — Progress Notes (Signed)
Summary: Questions about meds  Medications Added LANSOPRAZOLE 30 MG CPDR (LANSOPRAZOLE) Lansoprazole suspension 30mg /tsp. Patient to use 1tsp via G tube twice a day       Phone Note Other Incoming   Caller: Patent examiner Nurse  [phone] Summary of Call: Has some questions about his meds Initial call taken by: Karna Christmas,  September 18, 2010 3:06 PM  Follow-up for Phone Call        Spoke with [name] and the carafate is not working as well as the Lansoprazole was. Wife talked to [facility] and they can compound a Lansoprazole suspension. Rx sent to [facility] for suspension. [name] notified. Follow-up by: Selinda Michaels RN,  September 18, 2010 4:38 PM    New/Updated Medications: LANSOPRAZOLE 30 MG CPDR (LANSOPRAZOLE) Lansoprazole suspension 30mg /tsp. Patient to use 1tsp via G tube twice a day Prescriptions: LANSOPRAZOLE 30 MG CPDR (LANSOPRAZOLE) Lansoprazole suspension 30mg /tsp. Patient to use 1tsp via G tube twice a day  #300 x 3   Entered by:   Selinda Michaels RN   Authorized by:   Louis Meckel MD   Signed by:   Selinda Michaels RN on 09/18/2010   Method used:   [facility]  [place] [address] [facility]       [address]       [place], [place]  [address]       Ph: [phone] or [phone]       Fax: [phone]   RxID:   [id number]   Appended Document: Questions about meds Rx was called into [facility] pharmacy.

## 2010-09-29 ENCOUNTER — Telehealth: Payer: Self-pay

## 2010-09-29 ENCOUNTER — Telehealth: Payer: Self-pay | Admitting: Pulmonary Disease

## 2010-09-29 NOTE — Telephone Encounter (Signed)
Received call from private duty nurse. Pt has been spitting up white, frothy secretions. Dr. Shelle Iron has ordered a chest xray in the home. Nurse wants to know if they can add on an xray of the abdomen. His abdomen has been distended and pt c/o discomfort. Per Mike Gip PA patient may have xray of the abdomen.

## 2010-09-29 NOTE — Telephone Encounter (Signed)
Spoke with pt's nurse Trinna Post.  She states that pt has finished 7 day course of cipro on 3/17 and his secretions are even more thick and are yellow in color.  He has had some low grade temp x 3 days- 99-99.7. O2 sats on RA are 88-90%.  She also states that he has been having some chest discomfort.  She is requesting order for cxr to be done in home through [facility]- there number is [phone] and fax is [phone].  Please advise thanks

## 2010-09-29 NOTE — Telephone Encounter (Signed)
I spoke with [facility] and they stated that I would have to give a verbal order to [facility] (the nurse that called) and have her send the correct paper work to them. Trinna Post is aware and will send correct paperwork for CXR and arrange for Sputum C&S in the morning.

## 2010-09-29 NOTE — Telephone Encounter (Signed)
Ok to have cxr, but needs suctioned sputum c and s first thing in am

## 2010-09-30 ENCOUNTER — Other Ambulatory Visit: Payer: Self-pay | Admitting: Pulmonary Disease

## 2010-09-30 ENCOUNTER — Telehealth: Payer: Self-pay | Admitting: Pulmonary Disease

## 2010-09-30 NOTE — Telephone Encounter (Signed)
KC, please advise what DX code to use. Thanks.

## 2010-09-30 NOTE — Telephone Encounter (Signed)
Called [place], spoke with [place].  She was informed to use dx code 466.0 for acute bronchitis for this per Dr. Shelle Iron.  She verbalized understanding.

## 2010-09-30 NOTE — Telephone Encounter (Signed)
Whatever the code is for acute bronchitis

## 2010-10-01 ENCOUNTER — Encounter: Payer: Self-pay | Admitting: Pulmonary Disease

## 2010-10-01 ENCOUNTER — Telehealth: Payer: Self-pay | Admitting: Pulmonary Disease

## 2010-10-01 ENCOUNTER — Telehealth: Payer: Self-pay

## 2010-10-01 ENCOUNTER — Inpatient Hospital Stay (HOSPITAL_COMMUNITY)
Admission: EM | Admit: 2010-10-01 | Discharge: 2010-10-08 | DRG: 207 | Disposition: A | Discharge status: Home Health | Payer: Medicare Other | Attending: Internal Medicine | Admitting: Internal Medicine

## 2010-10-01 ENCOUNTER — Emergency Department (HOSPITAL_COMMUNITY): Payer: Medicare Other

## 2010-10-01 DIAGNOSIS — Z9981 Dependence on supplemental oxygen: Secondary | ICD-10-CM

## 2010-10-01 DIAGNOSIS — Z6825 Body mass index (BMI) 25.0-25.9, adult: Secondary | ICD-10-CM

## 2010-10-01 DIAGNOSIS — J151 Pneumonia due to Pseudomonas: Secondary | ICD-10-CM | POA: Diagnosis present

## 2010-10-01 DIAGNOSIS — F411 Generalized anxiety disorder: Secondary | ICD-10-CM | POA: Diagnosis present

## 2010-10-01 DIAGNOSIS — J9620 Acute and chronic respiratory failure, unspecified whether with hypoxia or hypercapnia: Principal | ICD-10-CM | POA: Diagnosis present

## 2010-10-01 DIAGNOSIS — E876 Hypokalemia: Secondary | ICD-10-CM | POA: Diagnosis present

## 2010-10-01 DIAGNOSIS — Z515 Encounter for palliative care: Secondary | ICD-10-CM

## 2010-10-01 DIAGNOSIS — R7309 Other abnormal glucose: Secondary | ICD-10-CM | POA: Diagnosis not present

## 2010-10-01 DIAGNOSIS — I10 Essential (primary) hypertension: Secondary | ICD-10-CM | POA: Diagnosis present

## 2010-10-01 DIAGNOSIS — G1221 Amyotrophic lateral sclerosis: Secondary | ICD-10-CM | POA: Diagnosis present

## 2010-10-01 LAB — POCT CARDIAC MARKERS
CKMB, poc: <1 ng/mL — ABNORMAL LOW (ref 1.0–8.0)
Myoglobin, poc: 41.9 ng/mL (ref 12–200)
Troponin i, poc: <0.05 ng/mL (ref 0.00–0.09)

## 2010-10-01 LAB — COMPREHENSIVE METABOLIC PANEL
Alkaline Phosphatase: 129 U/L — ABNORMAL HIGH (ref 39–117)
BUN: 7 mg/dL (ref 6–23)
Glucose, Bld: 226 mg/dL — ABNORMAL HIGH (ref 70–99)
Potassium: 3.1 mEq/L — ABNORMAL LOW (ref 3.5–5.1)
Total Protein: 7.9 g/dL (ref 6.0–8.3)

## 2010-10-01 LAB — CBC
MCH: 26.3 pg (ref 26.0–34.0)
MCV: 78.8 fL (ref 78.0–100.0)
Platelets: 342 10*3/uL (ref 150–400)
RBC: 4.67 MIL/uL (ref 4.22–5.81)
RDW: 15.9 % — ABNORMAL HIGH (ref 11.5–15.5)

## 2010-10-01 LAB — DIFFERENTIAL
Eosinophils Absolute: 0 10*3/uL (ref 0.0–0.7)
Eosinophils Relative: 0 % (ref 0–5)
Lymphs Abs: 0.9 10*3/uL (ref 0.7–4.0)
Monocytes Absolute: 0.8 10*3/uL (ref 0.1–1.0)
Monocytes Relative: 5 % (ref 3–12)

## 2010-10-01 LAB — PROCALCITONIN: Procalcitonin: 0.21 ng/mL

## 2010-10-01 LAB — URINALYSIS, ROUTINE W REFLEX MICROSCOPIC
Bilirubin Urine: NEGATIVE
Ketones, ur: NEGATIVE mg/dL
Nitrite: POSITIVE — AB
Protein, ur: 30 mg/dL — AB
Specific Gravity, Urine: 1.022 (ref 1.005–1.030)
Urobilinogen, UA: 1 mg/dL (ref 0.0–1.0)

## 2010-10-01 LAB — MAGNESIUM: Magnesium: 2.1 mg/dL (ref 1.5–2.5)

## 2010-10-01 LAB — URINE MICROSCOPIC-ADD ON

## 2010-10-01 LAB — PHOSPHORUS: Phosphorus: 2.2 mg/dL — ABNORMAL LOW (ref 2.3–4.6)

## 2010-10-01 NOTE — Telephone Encounter (Signed)
[name] wants [name]. To know that [facility] just faxed over cxr and to be on the lookout for it.

## 2010-10-01 NOTE — Telephone Encounter (Signed)
Transferred call to Zackery Barefoot.[name]

## 2010-10-01 NOTE — Telephone Encounter (Signed)
cxr received and handed to [facility].  Will route msg to him as well.

## 2010-10-01 NOTE — Telephone Encounter (Signed)
Spoke with [name], and fax has been received and reviewed by [facility]. Per [facility] will need to review CXR and will then determine next step. Gave caller triage fax number to have CXR report sent to. Yesterday T-99.0, x days ago T-101.0, has been using Tylenol and this am was T-97. Will place note on hold pending fax and will place lab work on [employer]. Julaine Hua, CMA

## 2010-10-01 NOTE — Telephone Encounter (Signed)
Xray came back from yesterday. When pt is reclined his O2 sats drop. Xray reviewed by Mike Gip PA. Problem with O2 sats falling is not felt to be GI related. Called Dr. Teddy Spike office and spoke with his nurse. They are aware anda have the xray reports and lab results. [name] the homehealth RN aware.

## 2010-10-02 ENCOUNTER — Inpatient Hospital Stay (HOSPITAL_COMMUNITY): Payer: Medicare Other

## 2010-10-02 LAB — GLUCOSE, CAPILLARY: Glucose-Capillary: 200 mg/dL — ABNORMAL HIGH (ref 70–99)

## 2010-10-02 LAB — POCT I-STAT 3, ART BLOOD GAS (G3+): Patient temperature: 98.5

## 2010-10-02 NOTE — Telephone Encounter (Signed)
Will forward message to KC 

## 2010-10-03 ENCOUNTER — Inpatient Hospital Stay (HOSPITAL_COMMUNITY): Payer: Medicare Other

## 2010-10-03 LAB — RESPIRATORY CULTURE OR RESPIRATORY AND SPUTUM CULTURE: Gram Stain: NONE SEEN

## 2010-10-03 LAB — GLUCOSE, CAPILLARY
Glucose-Capillary: 194 mg/dL — ABNORMAL HIGH (ref 70–99)
Glucose-Capillary: 211 mg/dL — ABNORMAL HIGH (ref 70–99)

## 2010-10-03 LAB — VANCOMYCIN, TROUGH: Vancomycin Tr: 12.6 ug/mL (ref 10.0–20.0)

## 2010-10-03 NOTE — Telephone Encounter (Signed)
Noted.  Pt admitted to hospital by me.

## 2010-10-04 LAB — BASIC METABOLIC PANEL
BUN: 7 mg/dL (ref 6–23)
CO2: 33 mEq/L — ABNORMAL HIGH (ref 19–32)
Calcium: 8.4 mg/dL (ref 8.4–10.5)
Chloride: 95 mEq/L — ABNORMAL LOW (ref 96–112)
Creatinine, Ser: <0.3 mg/dL — ABNORMAL LOW (ref 0.4–1.5)
Glucose, Bld: 188 mg/dL — ABNORMAL HIGH (ref 70–99)
Sodium: 139 mEq/L (ref 135–145)

## 2010-10-04 LAB — CBC
HCT: 32.5 % — ABNORMAL LOW (ref 39.0–52.0)
Hemoglobin: 10.6 g/dL — ABNORMAL LOW (ref 13.0–17.0)
MCH: 26.3 pg (ref 26.0–34.0)
MCHC: 32.6 g/dL (ref 30.0–36.0)
MCV: 80.6 fL (ref 78.0–100.0)
RBC: 4.03 MIL/uL — ABNORMAL LOW (ref 4.22–5.81)

## 2010-10-04 LAB — GLUCOSE, CAPILLARY

## 2010-10-05 DIAGNOSIS — Z9911 Dependence on respirator [ventilator] status: Secondary | ICD-10-CM

## 2010-10-05 DIAGNOSIS — J151 Pneumonia due to Pseudomonas: Secondary | ICD-10-CM

## 2010-10-05 DIAGNOSIS — J9620 Acute and chronic respiratory failure, unspecified whether with hypoxia or hypercapnia: Secondary | ICD-10-CM

## 2010-10-05 DIAGNOSIS — G1221 Amyotrophic lateral sclerosis: Secondary | ICD-10-CM

## 2010-10-05 LAB — GLUCOSE, CAPILLARY: Glucose-Capillary: 213 mg/dL — ABNORMAL HIGH (ref 70–99)

## 2010-10-05 LAB — BASIC METABOLIC PANEL
CO2: 30 mEq/L (ref 19–32)
Calcium: 8.4 mg/dL (ref 8.4–10.5)
Creatinine, Ser: <0.3 mg/dL — ABNORMAL LOW (ref 0.4–1.5)

## 2010-10-06 LAB — GLUCOSE, CAPILLARY
Glucose-Capillary: 129 mg/dL — ABNORMAL HIGH (ref 70–99)
Glucose-Capillary: 111 mg/dL — ABNORMAL HIGH (ref 70–99)
Glucose-Capillary: 133 mg/dL — ABNORMAL HIGH (ref 70–99)
Glucose-Capillary: 140 mg/dL — ABNORMAL HIGH (ref 70–99)
Glucose-Capillary: 151 mg/dL — ABNORMAL HIGH (ref 70–99)
Glucose-Capillary: 174 mg/dL — ABNORMAL HIGH (ref 70–99)
Glucose-Capillary: 180 mg/dL — ABNORMAL HIGH (ref 70–99)
Glucose-Capillary: 187 mg/dL — ABNORMAL HIGH (ref 70–99)
Glucose-Capillary: 192 mg/dL — ABNORMAL HIGH (ref 70–99)
Glucose-Capillary: 199 mg/dL — ABNORMAL HIGH (ref 70–99)
Glucose-Capillary: 201 mg/dL — ABNORMAL HIGH (ref 70–99)
Glucose-Capillary: 256 mg/dL — ABNORMAL HIGH (ref 70–99)
Glucose-Capillary: 281 mg/dL — ABNORMAL HIGH (ref 70–99)

## 2010-10-06 LAB — CROSSMATCH

## 2010-10-06 LAB — CBC
HCT: 20.4 % — ABNORMAL LOW (ref 39.0–52.0)
HCT: 22.1 % — ABNORMAL LOW (ref 39.0–52.0)
HCT: 24.1 % — ABNORMAL LOW (ref 39.0–52.0)
HCT: 26.1 % — ABNORMAL LOW (ref 39.0–52.0)
HCT: 27.3 % — ABNORMAL LOW (ref 39.0–52.0)
Hemoglobin: 7 g/dL — ABNORMAL LOW (ref 13.0–17.0)
Hemoglobin: 7.5 g/dL — ABNORMAL LOW (ref 13.0–17.0)
Hemoglobin: 9.2 g/dL — ABNORMAL LOW (ref 13.0–17.0)
Hemoglobin: 9.3 g/dL — ABNORMAL LOW (ref 13.0–17.0)
MCHC: 33.9 g/dL (ref 30.0–36.0)
MCHC: 34.1 g/dL (ref 30.0–36.0)
MCHC: 34.4 g/dL (ref 30.0–36.0)
MCV: 93.5 fL (ref 78.0–100.0)
MCV: 93.9 fL (ref 78.0–100.0)
MCV: 93.9 fL (ref 78.0–100.0)
MCV: 95.8 fL (ref 78.0–100.0)
MCV: 96.6 fL (ref 78.0–100.0)
MCV: 97.2 fL (ref 78.0–100.0)
Platelets: 235 10*3/uL (ref 150–400)
Platelets: 236 10*3/uL (ref 150–400)
Platelets: 326 10*3/uL (ref 150–400)
Platelets: 335 10*3/uL (ref 150–400)
RBC: 2.09 MIL/uL — ABNORMAL LOW (ref 4.22–5.81)
RBC: 2.71 MIL/uL — ABNORMAL LOW (ref 4.22–5.81)
RBC: 2.92 MIL/uL — ABNORMAL LOW (ref 4.22–5.81)
RDW: 15.2 % (ref 11.5–15.5)
RDW: 15.3 % (ref 11.5–15.5)
RDW: 16 % — ABNORMAL HIGH (ref 11.5–15.5)
RDW: 16.6 % — ABNORMAL HIGH (ref 11.5–15.5)
RDW: 17 % — ABNORMAL HIGH (ref 11.5–15.5)
RDW: 17.8 % — ABNORMAL HIGH (ref 11.5–15.5)
WBC: 10.9 10*3/uL — ABNORMAL HIGH (ref 4.0–10.5)
WBC: 22.6 10*3/uL — ABNORMAL HIGH (ref 4.0–10.5)
WBC: 23.1 10*3/uL — ABNORMAL HIGH (ref 4.0–10.5)
WBC: 9.7 10*3/uL (ref 4.0–10.5)
WBC: 9.9 10*3/uL (ref 4.0–10.5)

## 2010-10-06 LAB — COMPREHENSIVE METABOLIC PANEL
BUN: 29 mg/dL — ABNORMAL HIGH (ref 6–23)
Calcium: 7.9 mg/dL — ABNORMAL LOW (ref 8.4–10.5)
Creatinine, Ser: <0.3 mg/dL — ABNORMAL LOW (ref 0.4–1.5)
Glucose, Bld: 284 mg/dL — ABNORMAL HIGH (ref 70–99)
Sodium: 138 mEq/L (ref 135–145)
Total Protein: 5.4 g/dL — ABNORMAL LOW (ref 6.0–8.3)

## 2010-10-06 LAB — URINALYSIS, ROUTINE W REFLEX MICROSCOPIC
Bilirubin Urine: NEGATIVE
Glucose, UA: >1000 mg/dL — AB
Hgb urine dipstick: NEGATIVE
Ketones, ur: 40 mg/dL — AB
Protein, ur: NEGATIVE mg/dL
Urobilinogen, UA: 0.2 mg/dL (ref 0.0–1.0)

## 2010-10-06 LAB — CULTURE, BLOOD (ROUTINE X 2)

## 2010-10-06 LAB — HEMOGLOBIN AND HEMATOCRIT, BLOOD
HCT: 29.1 % — ABNORMAL LOW (ref 39.0–52.0)
Hemoglobin: 10 g/dL — ABNORMAL LOW (ref 13.0–17.0)

## 2010-10-06 LAB — BASIC METABOLIC PANEL
BUN: 19 mg/dL (ref 6–23)
CO2: 30 mEq/L (ref 19–32)
Calcium: 8.7 mg/dL (ref 8.4–10.5)
Calcium: 8 mg/dL — ABNORMAL LOW (ref 8.4–10.5)
Calcium: 8.2 mg/dL — ABNORMAL LOW (ref 8.4–10.5)
Glucose, Bld: 208 mg/dL — ABNORMAL HIGH (ref 70–99)
Glucose, Bld: 305 mg/dL — ABNORMAL HIGH (ref 70–99)
Potassium: 4.4 mEq/L (ref 3.5–5.1)
Potassium: 3 mEq/L — ABNORMAL LOW (ref 3.5–5.1)
Sodium: 140 mEq/L (ref 135–145)
Sodium: 141 mEq/L (ref 135–145)
Sodium: 145 mEq/L (ref 135–145)

## 2010-10-06 LAB — URINE CULTURE: Colony Count: 95000

## 2010-10-06 LAB — PROTIME-INR
INR: 1.24 (ref 0.00–1.49)
Prothrombin Time: 15.5 seconds — ABNORMAL HIGH (ref 11.6–15.2)

## 2010-10-06 LAB — FOLATE: Folate: >20 ng/mL

## 2010-10-06 LAB — APTT: aPTT: 28 seconds (ref 24–37)

## 2010-10-06 LAB — CULTURE, BAL-QUANTITATIVE W GRAM STAIN: Colony Count: 8000

## 2010-10-06 LAB — CARDIAC PANEL(CRET KIN+CKTOT+MB+TROPI)
CK, MB: 1.4 ng/mL (ref 0.3–4.0)
CK, MB: 1.8 ng/mL (ref 0.3–4.0)
Relative Index: INVALID (ref 0.0–2.5)
Relative Index: INVALID (ref 0.0–2.5)
Troponin I: 0.02 ng/mL (ref 0.00–0.06)
Troponin I: 0.04 ng/mL (ref 0.00–0.06)

## 2010-10-06 LAB — POCT CARDIAC MARKERS
CKMB, poc: <1 ng/mL — ABNORMAL LOW (ref 1.0–8.0)
Myoglobin, poc: 17.9 ng/mL (ref 12–200)

## 2010-10-06 LAB — RETICULOCYTES: Retic Count, Absolute: 149.8 10*3/uL (ref 19.0–186.0)

## 2010-10-06 LAB — DIFFERENTIAL
Lymphocytes Relative: 9 % — ABNORMAL LOW (ref 12–46)
Lymphs Abs: 1.6 10*3/uL (ref 0.7–4.0)
Monocytes Relative: 6 % (ref 3–12)
Neutro Abs: 15 10*3/uL — ABNORMAL HIGH (ref 1.7–7.7)
Neutrophils Relative %: 84 % — ABNORMAL HIGH (ref 43–77)

## 2010-10-06 LAB — SAMPLE TO BLOOD BANK

## 2010-10-06 LAB — TROPONIN I: Troponin I: 0.02 ng/mL (ref 0.00–0.06)

## 2010-10-06 LAB — FERRITIN: Ferritin: 124 ng/mL (ref 22–322)

## 2010-10-06 LAB — MICROALBUMIN / CREATININE URINE RATIO: Microalb, Ur: 0.5 mg/dL (ref 0.00–1.89)

## 2010-10-06 LAB — IRON AND TIBC
Iron: 82 ug/dL (ref 42–135)
Saturation Ratios: 28 % (ref 20–55)
TIBC: 295 ug/dL (ref 215–435)

## 2010-10-06 LAB — PREPARE RBC (CROSSMATCH)

## 2010-10-06 LAB — HEMOGLOBIN A1C: Mean Plasma Glucose: 146 mg/dL

## 2010-10-06 LAB — LIPASE, BLOOD: Lipase: 17 U/L (ref 11–59)

## 2010-10-07 ENCOUNTER — Inpatient Hospital Stay (HOSPITAL_COMMUNITY): Payer: Medicare Other

## 2010-10-07 LAB — GLUCOSE, CAPILLARY
Glucose-Capillary: 148 mg/dL — ABNORMAL HIGH (ref 70–99)
Glucose-Capillary: 206 mg/dL — ABNORMAL HIGH (ref 70–99)
Glucose-Capillary: 153 mg/dL — ABNORMAL HIGH (ref 70–99)
Glucose-Capillary: 196 mg/dL — ABNORMAL HIGH (ref 70–99)

## 2010-10-07 LAB — BASIC METABOLIC PANEL WITH GFR
BUN: 14 mg/dL (ref 6–23)
CO2: 30 meq/L (ref 19–32)
Calcium: 8.7 mg/dL (ref 8.4–10.5)
Chloride: 100 meq/L (ref 96–112)
Creatinine, Ser: <0.3 mg/dL — ABNORMAL LOW (ref 0.4–1.5)
Glucose, Bld: 149 mg/dL — ABNORMAL HIGH (ref 70–99)
Potassium: 3.7 meq/L (ref 3.5–5.1)
Sodium: 139 meq/L (ref 135–145)

## 2010-10-07 LAB — MAGNESIUM: Magnesium: 2.4 mg/dL (ref 1.5–2.5)

## 2010-10-07 LAB — PHOSPHORUS: Phosphorus: 3.6 mg/dL (ref 2.3–4.6)

## 2010-10-08 ENCOUNTER — Inpatient Hospital Stay (HOSPITAL_COMMUNITY): Payer: Medicare Other

## 2010-10-08 LAB — GLUCOSE, CAPILLARY
Glucose-Capillary: 139 mg/dL — ABNORMAL HIGH (ref 70–99)
Glucose-Capillary: 182 mg/dL — ABNORMAL HIGH (ref 70–99)

## 2010-10-08 LAB — CULTURE, BLOOD (ROUTINE X 2)
Culture  Setup Time: 201203290503
Culture  Setup Time: 201203290503
Culture: NO GROWTH

## 2010-10-08 LAB — BLOOD GAS, ARTERIAL
Drawn by: 249101
O2 Saturation: 93.6 %
PEEP: 5 cmH2O
Patient temperature: 98.6
RATE: 14 resp/min

## 2010-10-08 LAB — BASIC METABOLIC PANEL
BUN: 17 mg/dL (ref 6–23)
CO2: 29 mEq/L (ref 19–32)
Calcium: 8.7 mg/dL (ref 8.4–10.5)
Chloride: 101 mEq/L (ref 96–112)
Creatinine, Ser: <0.3 mg/dL — ABNORMAL LOW (ref 0.4–1.5)
Glucose, Bld: 135 mg/dL — ABNORMAL HIGH (ref 70–99)

## 2010-10-09 ENCOUNTER — Telehealth: Payer: Self-pay | Admitting: Pulmonary Disease

## 2010-10-09 NOTE — Telephone Encounter (Signed)
Pt arrived home from the hospital today and his nurse, Trinna Post, wants clarification on a few things.  1) Needs verbal order to resume ALL home meds. 2) Number of times daily or weekly blood glucose levels should be checked 3) Vent setting changed in hospital to alarm at 50%. Is this okay? Please advise.

## 2010-10-09 NOTE — Telephone Encounter (Signed)
Spoke with nurse and notified of the recs per [facility].  She verbalized understanding.

## 2010-10-09 NOTE — Telephone Encounter (Signed)
Ok to resume home meds Blood sugars can be checked twice a week.  Call if greater than 250. I don;t know what it means that alarm is going off at 50%.  He shouldn't have alarms for oxygen concentration on vent??

## 2010-10-10 ENCOUNTER — Telehealth: Payer: Self-pay | Admitting: Internal Medicine

## 2010-10-10 MED ORDER — MORPHINE SULFATE 20 MG/5ML PO SOLN: 5.0000 mg | ORAL | Status: DC | PRN

## 2010-10-10 NOTE — Telephone Encounter (Signed)
Mardi Mainland visiting nurse requesting new rx for MSIR 5 mg - was discharged on MS IR 15 mg but wife expressed desire to try the 5 mg first.  Rx for 100 cc  MS 20 mg / ml   Sig 1.2 cc q2 h prn [facility]

## 2010-10-11 ENCOUNTER — Telehealth: Payer: Self-pay | Admitting: Internal Medicine

## 2010-10-11 NOTE — Telephone Encounter (Signed)
Discussed with Mardi Mainland that wife was confused about dosing on MS and wants to continue as originally prescribed so canceled rx for MS at 5 mg q4 h prn

## 2010-10-14 ENCOUNTER — Telehealth: Payer: Self-pay | Admitting: Pulmonary Disease

## 2010-10-14 ENCOUNTER — Encounter: Payer: Self-pay | Admitting: Gastroenterology

## 2010-10-14 NOTE — Telephone Encounter (Signed)
Ok with me 

## 2010-10-14 NOTE — Telephone Encounter (Signed)
[name] is aware per [facility] okay for cath flush and verbal order was given via phone.

## 2010-10-14 NOTE — Telephone Encounter (Signed)
[name] phoned returned call to triage stated the pharmacy needs to know before 5 so they can ship the cath flow to patient's house. She can be reached at  [phone]

## 2010-10-14 NOTE — Telephone Encounter (Signed)
lmom for julie to call.

## 2010-10-14 NOTE — Telephone Encounter (Signed)
Spoke w/ Raynelle Fanning and she states pt is currently on IV antibiotics and Raynelle Fanning states pt picc line is stopped up. Raynelle Fanning is wanting an order for a cath flow so they can flush the picc line. Raynelle Fanning states they have orders for pt to do blood work but can't do it until pt picc line is cleared. Nurse states if they can have this order by 5:00 so they can have the pharmacy ship this out so they can go tomorrow and flush the picc line. Please advise Dr. Shelle Iron if okay to send order. Thanks.

## 2010-10-15 ENCOUNTER — Telehealth: Payer: Self-pay | Admitting: Pulmonary Disease

## 2010-10-15 NOTE — Telephone Encounter (Signed)
Spoke w/ Raynelle Fanning and she states the pt's lumen is clogged and the cath flow will not even clean it out. Raynelle Fanning states she knows Dr. Shelle Iron is waiting on pt lab results and they state they are trying their best to get the lumen unclogged. Raynelle Fanning states pt is very difficult and is very hard to stick and get blood from. Raynelle Fanning states pt is going to have a mobile cxr done today and will send results over as soon as this is done. Raynelle Fanning just wanted to make Dr. Shelle Iron aware of what is going on and they are trying their best to draw blood from pt so they can send results over. Please advise Dr. Shelle Iron. Thanks  Carver Fila, CMA

## 2010-10-15 NOTE — H&P (Signed)
[name], [name]             ACCOUNT NO.:  [id number]  MEDICAL RECORD NO.:  [id number]           PATIENT TYPE:  I  LOCATION:  2104                         FACILITY:  [facility]  PHYSICIAN:  Barbaraann Share, MD,FCCPDATE OF BIRTH:  16-Feb-1956  DATE OF ADMISSION:  10/01/2010 DATE OF DISCHARGE:                             HISTORY & PHYSICAL   HISTORY OF PRESENT ILLNESS:  The patient is a 55 year old male well- known to me with ventilator dependence end-stage ALS.  He has been cared for at home for many years by his wife and multiple healthcare workers, who have given him immaculate care.  He has been having increased numbers of pulmonary infections over the past 6 months and has grown Pseudomonas in the past.  Most recently, he has been treated with multiple rounds of antibiotics including Cipro, but continues to have recurrent infections.  Most recently, he developed increasing respiratory distress as well as fever.  He had a portable x-ray at his home which showed multilobar infiltrates.  He also had a sputum culture sent.  It is growing Gram-positive cocci and Gram-negative rods. Because of his worsening respiratory distress and poor response to oral antibiotics, I asked the wife to bring him to the emergency room tonight for admission.  The patient has been a full code in the past but his family currently is considering palliative care.  PAST MEDICAL HISTORY: 1. 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 2. Anxiety.  SOCIAL HISTORY:  The patient lives at home with his wife and multiple caregivers and multiple healthcare workers.  He does not smoke and currently does not drink alcohol.  FAMILY HISTORY:  Noncontributory to his current medical issues.  REVIEW OF SYSTEMS:  Significant for a productive cough, hand and feet swelling, change in color of mucus.  Shortness of breath at rest, chest discomfort, indigestion, intermittent abdominal pain, headaches, nasal congestion,  and difficulty breathing through nose, itching, anxiety, depression, and joint stiffness.  PHYSICAL EXAMINATION:  GENERAL:  He is a well-developed male with mild respiratory distress.  He is unable to communicate because of his ALS and ventilator dependence. VITAL SIGNS:  Blood pressure is 127/60, pulse is 120 and regular, temperature is 101.5, and O2 sats are adequate on 40% on the ventilator. HEENT:  Pupils equal, round, and reactive to light and accommodation. The patient was unable to move his eyes except just a very small amount. Nares are patent without discharge.  Oropharynx is clear and does not appear to be dry. NECK:  Supple without JVD or lymphadenopathy.  There is no thyromegaly. Clean tracheostomy is in place. CHEST:  Crackles and rhonchi throughout all lung fields but no wheezes. CARDIAC:  Tachycardic but regular rhythm. ABDOMEN:  Soft, nontender with good bowel sounds.  PEG is in place with a normal-appearing site. GENITOURINARY:  Not done, not indicated. RECTAL:  Not done, not indicated. BREAST:  Not done, not indicated. EXTREMITIES:  Lower extremities showed 1+ edema bilaterally.  Pulses are intact distally.  There is no cyanosis. NEUROLOGIC:  He is awake but unable to communicate with movement or lip movement.  He is unable to move his extremities.  IMPRESSION:  Acute on chronic respiratory failure secondary to multilobar pneumonia in a patient with amyotrophic lateral sclerosis and ventilator dependency.  He has failed multiple regimens of outpatient antibiotics and will obviously need to be admitted for IV antibiotics. The plan at this time is to have a PICC line placed and start on established therapy until he is somewhat improved.  He will then be sent home to his excellent care where he will finish up his antibiotic course.  I would also like to have the family speak with hospice since they have expressed some interest in moving toward more comfort care given  end-stage nature of his disease.  PLAN: 1. We will admit for IV antibiotics consisting of vancomycin,     Primaxin, and ciprofloxacin.  We will need to follow up pending     sputum C and S as an outpatient. 2. Nebs for pulmonary hygiene. 3. We will check ABG. 4. Hospice consult to discuss outpatient services.     Barbaraann Share, MD,FCCP     [facility]  D:  10/01/2010  T:  10/02/2010  Job:  [id number]  Electronically Signed by Marcelyn Bruins MDFCCP on 10/15/2010 01:27:12 PM

## 2010-10-15 NOTE — Consult Note (Signed)
[name], [name]             ACCOUNT NO.:  [id number]  MEDICAL RECORD NO.:  [id number]           PATIENT TYPE:  I  LOCATION:  2606                         FACILITY:  [facility]  PHYSICIAN:  Anh Mangano L. Ladona Ridgel, MD  DATE OF BIRTH:  03/29/1956  DATE OF CONSULTATION:  10/03/2010 DATE OF DISCHARGE:                                CONSULTATION   REFERRING PHYSICIAN:  Barbaraann Share, MD,FCCP.  REASON FOR CONSULTATION:  To address goals of care and complex plan of care with related symptom management.  HISTORY OF PRESENT ILLNESS:  The patient is a 55 year old white male with a history of ALS, that is [name], who is a ventilatory dependent and has a PEG for feeding.  The patient was diagnosed approximately 11 years ago and has had a recent decline in his condition with increased secretions and dyspnea.  The family describes him as "suffering" and is being able to see from his appearance and from the sounds that he is making that he is not comfortable.  I met with his wife, [name] and son, [name], he also has a second son named [name].  The family is struggling with interpreting [name]'s desire to fight through his illness.  In the past, the patient has expressed a desire to be a full code at all times.  He also wrote that he would never consider Hospice care (at least within the context of his illness at that time). [name] had offered conflicting advice regarding his time of death and defined his time of death as desiring for it to be "natural."  [name] recognizes that he is physically declining and he used the "suffering" multiple times throughout the interview.  REVIEW OF SYSTEMS:  Unable to be obtained secondary to the patient being nonverbal.  PAST MEDICAL HISTORY:  Pneumonia, [name], dependent on a ventilator and a PEG for nutrition.  He has excellent home care and has no skin issues.  FAMILY HISTORY:  Unable to be obtained, but only appears that his parents  are alive, as I met his mother during the interview.  SOCIAL HISTORY:  No smoking or drinking.  He was a Teacher, English as a foreign language for [employer].  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  She states that she has been lucky to have known him and has always been treated as queen.  She states that he was very soft spoken, well loved, and did not like to have an argument.  He played baseball and was noted for many other career-defining descriptors that are outside the realm of this dictation.  PHYSICAL EXAMINATION:  The patient underwent only a brief examination secondary to his family not desiring him to be disturbed, but he is physically dependent on his ventilator.  He is dyspneic with tongue protrusion with each respiration.  He has a trach and a PEG.  He appears to be slightly puffy with some edema and he can move his eyes, but cannot move any other extremity.  LABORATORY DATA:  Laboratories were reviewed.  MEDICATIONS:  Medications were reviewed.  ASSESSMENT AND PLAN:  The bulk of this consultation are  however with  counseling and coordination of care.  I discussed the following goals of care with the patient's wife. 1. We differentiated between the practice of palliative care and     hospice.  I expressed to her at this time that as [name] remains on a     curative track that hospice likely would not be able to become     involved yet, but I would give him the benefit that they are out to     discuss the type of involvement that they could have if she so     desired.  Please note [name] has expressed in the past in writing     that he desired not to have hospice care, but this may have only     applied to the context of conversation and therefore his family is     going to think about the benefits to [name] as well as to them as the     family have been involving him in hospice care. 2. Full code.  The patient has expressed that he desires to be a full     code until the end of his life.  His  family is contemplating that     decision on his behalf as he appears to be suffering and they will     get back to Dr. Shelle Iron with regards to that.  The patient's wife     notes that she can bring a staff to CPR at any time since she is     the power of attorney and would likely make that decision if they     were initiated.  She is struggling with whether or not just to do     certain things for [name] to give him a chance to recover. 3. At this time, it appears the patient and family desire home death.     This may, however, change and we did discuss the news that might be     helpful if home death is not comfortable, which might include     considering a residential hospice or inpatient hospice if his     condition would allow [place] to admit him to that area of the hospital.     At this time, the patient has expressed a desire to his family to     treat infections and he is currently being treated for pneumonia. 4. Desire to maintain him on his ventilator.  We have not discussed     possible withdrawal to comfort. 5. Pain and dyspnea.  At this time, the patient is being given as-     needed medication in the form of opiates, which is completely     appropriate for his case.  I spoke with the family honestly about the hospice philosophy and how it might differ.  At this time, I will plan to have the hospice medical director consider his case and potentially go to the house for a hospice medical director education session.  This will be at the discretion of the hospice and their choice.  Again, we did talk about the palliative care unit with regard to his treatment.  At this time, his limitations would be that in fact he is a full code and is relying on his vent for cure.  I will discuss with our directors the palliative care unit in the future if his goals were to be a DNR and to maintain just a comfort level on his ventilator or  consider withdrawal of care.  We will update Dr. Shelle Iron on  the ability for the palliative care unit to be utilized in the future and the criteria that he would need to meet in order to utilize that area of the hospital.  Certainly our philosophy would welcome [name] and his family with open arms, however, in light of the fact that he desires a full code and has a ventilator in place, which at this time is meant to be curative, would exclude him from that area of the hospital.  We can carry out palliative care throughout the hospital and would definitely partner with Dr. Shelle Iron to do so.  I will update Dr. Shelle Iron for the following goals of care and will ask my partner to follow up in the morning for any other issues that may arise.  Total time with this family was from 7 p.m. to 8:57 p.m.  Greater than 50% of this interaction constituted counseling and coordination of care.     Kalimah Capurro L. Ladona Ridgel, MD     MLT/MEDQ  D:  10/04/2010  T:  10/04/2010  Job:  [id number]  cc:   Barbaraann Share, MD,FCCP Stefani Dama, M.D.  Electronically Signed by Derenda Mis MD on 10/15/2010 07:52:22 AM

## 2010-10-15 NOTE — Telephone Encounter (Signed)
Does the picc need to be replaced??  See if they know whether advanced can do a picc at home?

## 2010-10-15 NOTE — Telephone Encounter (Signed)
Spoke w/ Raynelle Fanning and she states pt will be finished with abx today. And I advised KC of this and he states then picc line can be removed once pt finishes abx. Raynelle Fanning verbalized understanding

## 2010-10-17 ENCOUNTER — Encounter: Payer: Self-pay | Admitting: Pulmonary Disease

## 2010-10-17 ENCOUNTER — Telehealth: Payer: Self-pay | Admitting: Pulmonary Disease

## 2010-10-17 NOTE — Telephone Encounter (Signed)
Nurse [name] aware of CXR results per Kc. They will see him next week at OV.

## 2010-10-17 NOTE — Telephone Encounter (Signed)
Let them know have reviewed labs and are ok IT is not unexpected to have very little change in cxr in this short of time period.  It often takes 8 weeks for cxr to clear after treatment.  Also, a lot of his abnormality is due to high diaphragms from muscle weakness with atelectasis.  As long as he is not having fever or worsening mucus, we are ok.

## 2010-10-17 NOTE — Telephone Encounter (Signed)
Spoke w/ Revonda Standard and she states pt had cxr and labs done yesterday and was calling to [facility] the report. CXR read their are bibasilar infiltrates w/o inprove,nt since prior study. Picc line is out LabsL WBC: 13.5, Hemoglobin:11.2, Hematicrit: 34.9, MCH: 25.5, Sodium:134. Revonda Standard is going to fax over these reports to our office. Gave fax # [phone]. Will forward to [facility] so he is aware of results  Carver Fila, CMA

## 2010-10-17 NOTE — Telephone Encounter (Signed)
Results are in your VIP look at fro cxr results and lab results. Pt has apt Monday w/ you at 2:45. Please advise thanks.  Carver Fila, CMA

## 2010-10-20 ENCOUNTER — Encounter: Payer: Self-pay | Admitting: Pulmonary Disease

## 2010-10-20 ENCOUNTER — Ambulatory Visit (INDEPENDENT_AMBULATORY_CARE_PROVIDER_SITE_OTHER): Payer: Medicare Other | Admitting: Pulmonary Disease

## 2010-10-20 DIAGNOSIS — G1221 Amyotrophic lateral sclerosis: Secondary | ICD-10-CM

## 2010-10-20 DIAGNOSIS — J9610 Chronic respiratory failure, unspecified whether with hypoxia or hypercapnia: Secondary | ICD-10-CM

## 2010-10-20 NOTE — Patient Instructions (Signed)
No change in meds, vent settings, or current care followup with me in 6mos, but call if issues arising.

## 2010-10-20 NOTE — Progress Notes (Signed)
  Subjective:    Patient ID: [name], male    DOB: January 16, 1956, 54 y.o.   MRN: [id number]  HPI The pt is a 54y/o male with known vent dep ALS who comes for posthospital f/u.  He was recently hospitalized with pseudomonal pna, and ultimately discharged home on IV primaxin/cipro for a total abx course of 14d.  He has done well per his caretakers and wife, with no further fever or significant secretions.  He did have a little more tinted secretions this am, but has not had any plugging episodes or resp distress since being home. His Picc has been removed.    Review of Systems Unable to get.  On vent and non-communicable.     Objective:   Physical Exam Wd male, not able to communicate, on vent.  No increased wob Chest with decreased bs and crackles in bases Cor with regular tachy LE with mild edema, no cyanosis  Alert, but unable to move extremeties.       Assessment & Plan:

## 2010-10-22 NOTE — Discharge Summary (Signed)
NAMESOHAIB, [name]             ACCOUNT NO.:  [id number]  MEDICAL RECORD NO.:  [id number]           PATIENT TYPE:  I  LOCATION:  2606                         FACILITY:  [facility]  PHYSICIAN:  Nelda Bucks, MD DATE OF BIRTH:  10-22-1955  DATE OF ADMISSION:  10/01/2010 DATE OF DISCHARGE:  10/08/2010                              DISCHARGE SUMMARY   FINAL DIAGNOSES: 1. Acute-on-chronic respiratory failure secondary to Pseudomonas     pneumonia superimposed on underlying ALS. 2. Pain/anxiety.  PROCEDURES:  PICC line.  This was placed on October 02, 2010 in the right arm.  Tracheostomy change this is performed by Dr. Rory Percy on October 08, 2010.  LABORATORY DATA:  Date October 08, 2010 sodium 139, potassium 3.4, chloride 101, CO2 29, glucose 135, BUN 17, creatinine less than 0.3.  Phosphorus on November 03, 2010, 3.6, magnesium was 2.4.  Blood cultures x2 on October 01, 2010 both negative to date.  MRSA PCR screening was negative on hospital admit day.  BRIEF HISTORY:  This is a 55 year old male patient with a known history of vent dependence in the setting of end-stage ALS presents after being noted to have increased episodes of pulmonary infections over the last 6 months with prior cultures in December 2011 growing Pseudomonas.  Most recently he has been treated with multiple rounds of Cipro, presented with increased respiratory distress, increased oxygen requirements, and pulmonary infiltrate as long as increased pulmonary secretions on October 01, 2010.  He was therefore admitted for further evaluation and therapy following failure to respond to outpatient antibiotics.  HOSPITAL COURSE BY DISCHARGE DIAGNOSES: 1. Acute-on-chronic respiratory failure secondary to presumed     Pseudomonas pneumonia superimposed on underlying ALS and chronic     ventilator dependence.  [name] was admitted to the step-down     unit.  Therapeutic interventions included gentle IV hydration,  panculture, ventilator support, and empiric antibiotics.     Ultimately, he continued to improve on empiric antibiotics and has     met maximum hospital benefit by October 08, 2010.  [name] will be     discharged to home on full ventilator support, and 7 more days of     therapy including imipenem and Cipro, this will complete his 14-day     course. 2. Anxiety/pain.  For this some small adjustments in his routine     regimen have been made.  DISCHARGE INSTRUCTIONS:  Activity per home routine.  DISCHARGE DIET: 1. He will resume his pulmo care tube feedings as previously     instructed. 2. Oxygen 2 liters this will be given through his ventilator setting.     He has been instructed to increase to 5 liters immediately before     suction and to maintain at 1 liter following tracheostomy     suctioning.  They have been instructed that they may titrate oxygen     up to 4 liters for saturations less than 92%.  They have been     instructed if oxygen saturations cannot be maintained above 92% on     2 liters to call our office, and if oxygen  saturations cannot be     maintained above 92% with 4 liters to call emergency medical     system.  DISCHARGE MEDICATIONS: 1. Cipro 400 mg intravenously q.12 h for 7 days. 2. Imipenem 500 mg every 6 hours for 7 days. 3. MSIR 15 mg 1 tablet crushed and given via tube every 6 hours as     needed for pain not relieved by Lortab. 4. Acuvail 0.45% 1 drop both eyes twice a day. 5. Albuterol every 4 hours. 6. Aloe juice 60 mL via tube three times a day. 7. Baclofen 10 mg via tube three times a day. 8. Benefiber over-the-counter 15 mL.  Take 1 tablespoon via tube     daily. 9. Clarinex 0.5 mg/mL 10 mL in tube daily. 10.Clonazepam 0.5 mg in tab twice a day. 11.Delsym 10 mL every 12 hours as needed. 12.Effexor 75 mg 2 tablets twice a day and 1 tablet a day with     Pulmocare. 13.Fish oil over-the-counter three times a day 5 mL. 14.Gaviscon 2-4 teaspoons  in the tube four times a day. 15.Honey 1 teaspoon in tube daily. 16.Ipratropium four times a day. 17.Lansoprazole 5 mL twice a day. 18.Lorazepam 0.5 mg 1 tablet in tube every 4 hours as needed. 19.Lortab 5/500 1 tablet every 4 hours as needed. 20.MiraLax 17 grams in tube daily. 21.Multivitamin 2 tablespoons via tube daily. 22.Nystatin 5 mL four times a day as needed. 23.Noni juice over-the-counter 30 mL in tube twice a day. 24.Pulmocare tube feedings 80-240 mL given at 6, 2, and 10:00 p.m. 25.Restoril 30 mg before sleep as needed. 26.Systane eye drops 0.4% both eyes every 2 hours. 27.Tylenol 500 mg 2 tablets every 4 hours as needed. 28.Vitamin B12 1 tablet daily. 29.Vitamin B12 injection intramuscular twice a week. 30.Xylocaine 2% cream topically as needed. 31.Yogurt smoothie 1 cup daily in tube.  DISCHARGE INSTRUCTIONS:  Ventilator settings these will be tidal volume 600 rate 14, PEEP of 5.  He will continue supplemental oxygen as mentioned above.  FOLLOWUP:  Dr. Shelle Iron April 16 at 02:45.  He also has a prescription for chest x-ray to be done at home on October 16, 2010.  DISPOSITION:  [name] has met maximum benefit from inpatient care. He will be discharged to home on full ventilator support as previously. He has follow up with Dr. Marcelyn Bruins as mentioned, as well as follow- up chest x-ray prior to that visit.  He additionally will continue to have 24-hour around-the-clock healthcare this is organized by the system, at the house, as well as his wife.  The instructions were reviewed in great detail with the patient's wife and she verbalizes readiness for discharge.  Over 30 minutes of time has been dedicated at discharge, assessment, and planning.     Zenia Resides, NP   ______________________________ Nelda Bucks, MD    PB/MEDQ  D:  10/08/2010  T:  10/08/2010  Job:  [id number]  cc:   Barbaraann Share, MD,FCCP  Electronically Signed by Zenia Resides NP on  10/20/2010 03:19:19 PM Electronically Signed by Rory Percy MD on 10/22/2010 01:25:42 PM

## 2010-10-23 ENCOUNTER — Telehealth: Payer: Self-pay | Admitting: Pulmonary Disease

## 2010-10-23 ENCOUNTER — Encounter: Payer: Self-pay | Admitting: Pulmonary Disease

## 2010-10-23 NOTE — Assessment & Plan Note (Signed)
The pt has endstage disease, and over the last year has had a significant decline in his status.  He currently appears stable, with no respiratory distress, and adequate oxygenation.  I have had a long discussion with the pt and his wife about EOL issues, and they had visited with palliative care in the hospital.  With the help of his wife, I have discussed with him heroic measures vs good medical care with no heroic efforts.  With the use of gaze preference, it appears the pt wishes to have no heroic measures done in the event of CP arrest.  Will document that he is a DNR, but the family understands that we will continue to provide good basic medical care for him.

## 2010-10-23 NOTE — Telephone Encounter (Signed)
Called and spoke with pt's nurse, [name].  Regarding recent conversation at OV with [facility] on 10/20/2010, [name] wanted to know if [name] was going to sign the DNR.  If so, [name] wanted to know if DNR can be mailed to pt's home address or if pt's wife will have to pick this up?  Also, spoke with pt's wife, [name].  [name] wanted to know if [facility] will write a note regarding pt's "critical state" regarding his pneumonia. [name] states she needs this to give to pt's ins company to allow for pt to continue to have 24 hour care and she wishes to continue on the 24 hour care while pt is recovering from the pneumonia.  Will forward message to [facility] to address.

## 2010-10-24 NOTE — Telephone Encounter (Signed)
Ask them if there is a fax number we can send it to. I am happy to write a note, and send it by fax as well.

## 2010-10-27 ENCOUNTER — Telehealth: Payer: Self-pay | Admitting: Pulmonary Disease

## 2010-10-27 NOTE — Telephone Encounter (Signed)
Called, spoke with [name].  She was informed of KC's statement and verbalized understanding of this.  She voiced no further questions at this time.

## 2010-10-27 NOTE — Telephone Encounter (Signed)
Per spouse, letter faxed to [facility] at [phone]. Both DNR and original letter mailed to the pt's home per spousal request. Copy of the DNR and letter given to [facility] to place in [facility] scan folder.

## 2010-10-27 NOTE — Telephone Encounter (Signed)
[name] phoned stated that she was calling back regarding message left this morning from [name] can be reached at [phone] stated that really need the DNR and needs to follow up about the questions that [name] had. [name]

## 2010-10-27 NOTE — Telephone Encounter (Signed)
Aggressive means more frequent suctioning, chest pt, etc if needed until he gets over his pna completely.   Also to be watched closer until he is back to his prior baseline.

## 2010-10-27 NOTE — Telephone Encounter (Signed)
Called spoke with [name] who has a question about the letter written by [facility].  She states that the letter mentions "aggressive treatment" and would like to know if this means now, or in the future if the need arises.  [name] states that she was at the appt w/ the patient and feels she understands what [facility] meant, but would like clarification so all of pt's care-givers can be aware.  KC, please clarify, thanks.

## 2010-11-21 ENCOUNTER — Telehealth: Payer: Self-pay | Admitting: Pulmonary Disease

## 2010-11-21 NOTE — Discharge Summary (Signed)
[name], [name]             ACCOUNT NO.:  [id number]   MEDICAL RECORD NO.:  [id number]          PATIENT TYPE:  INP   LOCATION:  0158                         FACILITY:  [facility]   PHYSICIAN:  [name], M.D. [facility] DATE OF BIRTH:  1956/01/24   DATE OF ADMISSION:  07/20/2004  DATE OF DISCHARGE:  07/25/2004                                 DISCHARGE SUMMARY   DISCHARGE DIAGNOSES:  1.  Colitis, presumed secondary to Clostridium difficile.  2.  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  3.  History of purulent sputum with cultures positive for Pseudomonas      aeruginosa.  4.  Hypokalemia which has been replenished.   HISTORY OF PRESENT ILLNESS:  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  He was hospitalized, April 11, 2004 until  June 02, 2004, ventilator dependency.  During that hospitalization, he  had a tracheostomy inserted and became permanently vent-dependent.  He also  had a chronic pseudomonas infection colonization along with methicillin-  resistant Staphylococcus aureus infection.  He has been treated with  multiple rounds of antibiotics and did develop diarrhea while in the  hospital.  After discharge home, he has had diarrhea which was positive for  Clostridium difficile toxins.  This was on May 13, 2004.  He has had  negative C. diff stool titers since but has continued to have problems with  diarrhea and despite outpatient treatment with Flagyl, he has continued to  have diarrhea and was admitted, on July 20, 2004, for further evaluation  and treatment.   LABORATORY DATA:  Urine culture negative.  C. diff toxin is negative.  Sputum was positive for Pseudomonas aeruginosa.  WBCs 15.8, hemoglobin 12.5,  hematocrit 37.9, platelets 170.  Sodium 139, potassium reached a  low of 3.3  with a return to 3.8, chloride 111, CO2 25, glucose 96, BUN 6, creatinine  .0.4, calcium 8.7.  Albumin 3.4, AST 24, ALT 31, ALP 67, total bilirubin  0.7, magnesium 2.0, phosphorous 3.2.  Amylase 46, lipase 21.  Lactic acid  1.0.  Sputum culture was positive for moderate Pseudomonas aeruginosa that  is only sensitive to tobramycin, _________, _________, gentamicin, cefepime,  ceftazidime.   RADIOGRAPHIC DATA:  Demonstrates chronic under aeration, otherwise no acute  disease process with the trach in place.   HOSPITAL COURSE BY DISCHARGE DIAGNOSES:  1.  Colitis.  The patient was admitted to [facility] and treated      with the usual pharmaceutical interventions and placed on vancomycin      p.o.  He had a GI consult performed, by Dr. Lina [name], with a select      SIG performed, on July 24, 2004, which showed severe colitis in a      distal section of the sigmoid colon.  A biopsy was done.  I have not      received the results of that yet.  He will be continued on vancomycin  treatments for 11 more days and follow up with GI services on August 15, 2004 at 1:45 p.m.  2.  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  He remains dependent on      home mechanical ventilator support and remains dependent.  His      ventilator settings are a tidal volume of 650 with a rate of 12 assist      control, along with 1 to 2 liters of O2 and 5 of PEEP.  He had a new      tracheostomy switched out while he was in the hospital and has gone out      on the new 7.0 trach.  3.  History of purulent sputum.  He did have a culture that was positive for      Pseudomonas aeruginosa.  We will consider this a colonization since the      reason he is in the hospital is colitis secondary to multiple rounds of      antimicrobial therapy.  At this time, he will be monitored.  He is      afebrile.  His WBC is  not elevated, therefore, it will be considered a      colonization.   DISCHARGE MEDICATIONS:  1.  Pulmocare six cans six times a day with three scoops of Promo daily      divided amongst the feedings.  2.  Questran 4 grams daily x 1 week.  3.  Vancomycin 250 mg suspension four times a day until gone.  4.  __________ suppository at bedtime.  5.  Zantac 150 mg via tube twice a day.  6.  Zoloft 150 mg every day via tube.  7.  Klonopin 2 mg at bedtime, 1 mg at daytime.  8.  Xopenex 1.25 and Atrovent 0.5 hand held nebulizers four times a day.  9.  Ambien 10 mg q.h.s.  10. Mylanta Gas 80 mg one every eight hours as needed.  11. Rowasa suppository one at night for the next ten days.   He will resume the services with [facility].   DIET:  As noted was, Pulmocare.   SPECIAL INSTRUCTIONS:  1.  He has a followup appointment with:      1.  Dr. Arlyce [name] on August 15, 2004 at 1:45 p.m.      2.  Dr. Marcelyn [name] on July 29, 2004 at 3 p.m.  2.  He has been instructed to call for any GI problems at [phone].   DISPOSITION/CONDITION ON DISCHARGE:  His diarrhea is improved where he is  having two loose stools daily.  His colitis is being addressed with the  aforementioned pharmaceutical interventions and antimicrobial therapy.  His  variant neuromuscular disease remains unchanged, remains vent-dependent and  trach-dependent.  He is being discharged home in improved condition.      SM/MEDQ  D:  07/25/2004  T:  07/25/2004  Job:  [id number]

## 2010-11-21 NOTE — Op Note (Signed)
[name], [name]             ACCOUNT NO.:  [id number]   MEDICAL RECORD NO.:  [id number]          PATIENT TYPE:  INP   LOCATION:  0161                         FACILITY:  [facility]   PHYSICIAN:  Genene Churn. Love, M.D.    DATE OF BIRTH:  10/09/1955   DATE OF PROCEDURE:  05/04/2004  DATE OF DISCHARGE:                                 OPERATIVE REPORT   This is a Tensilon test performed on May 04, 2004.   PROCEDURE:  Patient received 10 mg of Tensilon IV and had NIF performed  before and after Tensilon, which is negative inspiratory pressures.  The  value was 10 before average and 8 after on Tensilon.   IMPRESSION:  Negative Tensilon test.      JML/MEDQ  D:  05/04/2004  T:  05/04/2004  Job:  [id number]

## 2010-11-21 NOTE — Assessment & Plan Note (Signed)
[facility]                                 ON-CALL NOTE   NAME:Desir, Loyd                      MRN:          [id number]  DATE:07/21/2006                            DOB:          [birth year]    This call was made by Mr. Coles' personal home health nurse, Bridget Hartshorn.  She calls to tell me that Mr. Hubbert, who is a patient  followed by Dr. Shelle Iron at [facility] who is on a chronic  ventilator at home, has experienced some episodes of feeling flushed,  hot, and anxious with some associated respiratory distress.  Of note,  Mr. Maines was started on Septra approximately 5 days ago by Dr. Delford Field  in the setting of some low-grade temps and some generalized myalgias.  He has not had any documented fevers.  He is currently feeling back to  baseline, is tolerating his home ventilator on all of its usual settings  and is not dyspneic.  His nurse wonders whether his episodes may be  related to the medication and she would like to try stopping the Septra  to see if he improves.  I do not disagree with this plan, and we will  discontinue the Septra now with plans for her to call our office and set  up an office visit for Mr. Byrnes with Dr. Shelle Iron tomorrow morning,  sometime within the next 2 weeks.  I have asked her to call me tonight  if the patient has any further difficulties.  She understands to do this  or to call 911 if he decompensates acutely.     Leslye Peer, MD  Electronically Signed    RSB/MedQ  DD: 07/21/2006  DT: 07/22/2006  Job #: [id number]   cc:   Barbaraann Share, MD,FCCP

## 2010-11-21 NOTE — Assessment & Plan Note (Signed)
[facility]                                   ON-CALL NOTE   NAME:Grattan, Travus A                    MRN:          [id number]  DATE:02/23/2006                            DOB:          [birth year]    ON CALL NOTE:   DATE OF CONVERSATION:  February 23, 2006.   TIME OF CONVERSATION:  6:30 p.m.   I received a call from Mr. Amble nurse [name].  She said that she has  gotten the prescription for the Levaquin which Dr. Delford Field had called in  earlier in the week, however he did not start taking it until today.  Apparently after he took the first dose he developed redness in his face  associated with some increased dyspnea but then after receiving nebulizer  treatment this has improved somewhat.  He is relatively stable with regards  to his symptoms.  [name], the nurse reported that his temperature is now  98.8, blood pressure is 142/78, oxygen saturation is 94% and again he is not  complaining of any particular symptoms at the present time.  I have advised  her to have him stop taking the Levaquin.  I do not feel that he needs  additional dose of antibiotics at the present time because he has already  taken a dose of Levaquin today.  I have advised her to have Mr. Munch  contact the office tomorrow for further instructions as far as what to do.  Additionally I had advised her that if Mr. Schueler symptoms progress in any  way, he should either call us back or he should go to the nearest emergency  room for further evaluation.                                   Coralyn Helling, MD   VS/MedQ  DD:  02/23/2006  DT:  02/24/2006  Job #:  [id number]

## 2010-11-21 NOTE — Discharge Summary (Signed)
NAME:  Gregory [name], Gregory [name]                       ACCOUNT NO.:  [id number]   MEDICAL RECORD NO.:  [id number]                   PATIENT TYPE:  INP   LOCATION:  5740                                 FACILITY:  [facility]   PHYSICIAN:  Lina [name], M.D. [facility]               DATE OF BIRTH:  11/13/1955   DATE OF ADMISSION:  10/15/2003  DATE OF DISCHARGE:  10/20/2003                                 DISCHARGE SUMMARY   ADMITTING DIAGNOSES:  1. Abscess at PEG site.  2. Amyotrophic lateral sclerosis.  3. Dysphagia and speech difficulty secondary to ALS.  4. Hypercarbia, currently being treated on BiPAP during bedtime hours.  5. Hyperglycemia.  6. Oral Candida.  7. Minor hyponatremia.  8. History of irritable bowel syndrome, predominantly constipation prone;     not an active problem recently.  9. Status post tonsillectomy.  10.      Status post wisdom tooth extraction.  11.      Question prior colonoscopy per Gregory [name] in the past.   DISCHARGE DIAGNOSES:  1. Resolved PEG site infection.  The patient's abscess culture grew out     Klebsiella pneumonia sensitive to Cipro.  2. Oral Candida, resolved with Diflucan therapy.  3. ALS with dysphagia and dysphonia.  4. Progressive respiratory failure and hypercarbia requiring the use of     BiPAP nocturnally.  5. Diarrhea, probably secondary to increased rate of tube feeds, and     possibly secondary to antibiotics, but the patient is Clostridium     difficile negative.  6. Scant bleeding per rectum associated with diarrhea, resolved.  7. Hyperglycemia, possibly representing early diabetes mellitus type 2.  8. Protein malnutrition.  Tube feeding adjusted per nutritionist     recommendation.   PROCEDURES:  None.   CONSULTATIONS:  Gregory [name] for reevaluation of BiPAP.   BRIEF HISTORY:  Gregory [name] is Gregory [name] who has [name] 3-  year history of ALS.  He had been having weight loss from 210 pounds down to  156 pounds, and  was referred for PEG tube placement to Gregory [name].  He had been on Gregory [name], but had been eating so slowly that he had  been unable to meet his nutritional and caloric needs.  On October 05, 2003, [name]  PEG was placed with preoperative vancomycin administered to prevent  infection.  Since then, the patient has been tolerating the tube feeds well  with bolus feedings of Jevity, but developed some pain and redness at the  PEG site within several days.  Ultimately, [name] swelling the size of [name] golf  ball appeared and progressed to the size of [name] softball, and was quite  tender.  The family had not seen any pussy discharge.  He presented to the  emergency room on the day of admission.  He was afebrile on presentation,  but had had [name] temperature of about 100.5  within the last 2 days.  His white  count was 14,600, and there was obvious abscess with fluctuant mass at the  PEG site.  Once the PEG was loosened up, purulent discharge gushed out, and  the discharge was enhanced by pressure along the abscess.  Gregory [name] was  able to express quite [name] bit of pus, at which point, the abscess itself was  noticeably reduced in size and tenderness.   The patient had [name] Gastrografin study via the PEG, and this showed no  inappropriate extravasation of contrast.  He was admitted by Gregory [name] for  antibiotic therapy, and management of abscess at the PEG site.   LABORATORY DATA:  Clostridium difficile toxin was negative.  Fecal occult  blood testing on 3 occasions was positive.  Initial white blood cell count  was 14.6 (it corrected to 7.6).  Hemoglobin was 13.9, hematocrit 41.3, MCV  90.2.  Platelets of 239,000.  Differential showed increased neutrophils at  85%, and decreased lymphocytes at 5%.  Absolute monocyte count was 1.3.  Monocyte percentage, however, was normal.  Sodium was 134 (corrected to  138).  Potassium 3.9.  Carbon dioxide elevated at 37 (reference range is 19  to 32).  Glucose 125 and 134.   BUN 9, creatinine 1.0.  Prealbumin low at  15.8.  Abscess cultures grew out Klebsiella.   RADIOLOGY:  Acute abdominal series unremarkable.  Gastrografin study without  abnormality, and normal pattern of contrast in the stomach, duodenum, and  proximal jejunum with normal blood gas pattern.  CT scan of October 15, 2003  with IV contrast, and unfortunately limited amounts of oral contrast, and  they did not give him further oral contrast following the Gastrografin  study, which had been done at 6 [name].m. the same day.  In any event, the CT  scan showed some edema and possible inflammation in the area surrounding the  gastrostomy tube, but no obvious abscess or leakage of fluid seen.  There  was an incidental simple left peripelvic cyst, 3.3 x 4 cm.  Follow up CT of  October 17, 2003 showed improved inflammation at the gastrostomy tube site,  and definitely no abscess.  There was some developing gallbladder wall  edema.   HOSPITAL COURSE:  The patient was admitted and placed on IV Cipro and IV  Flagyl empirically.  He is, by the way, cephalosporin allergic.  The  afternoon of his admission, [name] jejunal feeding tube was placed through the  PEG tube.  Once this was placed, we started him back on tube feeds.  The  patient did well, and was afebrile throughout his hospitalization.  By the  following day, his white count had normalized.  He was still having some  tenderness and some purulent discharge from the PEG; however, it was much  improved, and the pain was definitely improved.   The patient expressed and displayed some shortness of breath, and was  consistently hypercarbic on metabolic profiles.  We have Gregory [name] come and  see the patient.  Gregory [name] had seen the patient [name] couple of years  previously.  He felt that he had progressive neuromuscular weakness from his  ALS leading to worsening hypercarbia and respiratory failure.  He adjusted the settings of the BiPAP machine to 12/8, and had  respiratory instruct the  patient in flutter valve training.  He wished for this patient to follow up  with him in 2-3 weeks, and we will attempt to make an appointment for  the  patient prior to his going home.  Otherwise, his wife can make the  appointment.   The patient's blood sugars were running in the 120's to 130's range.  We  have ordered [name] hemoglobin A1C, and that is pending.  However, his wife, who  is quite capable and on top of things, is aware of this, and we suggested  that she follow up either with her existing primary care physician, Dr.  Ouida Sills.  Also, the patient is considering switching internists, so it may be  that he follows up with [name] different internist regarding his blood sugars.  We did not feel it would be safe or necessary at this point to initiate any  oral agent therapy.   The prealbumin in this patient is low, and this confirms what is obvious  from examining the patient, and that is that he is malnourished.  He is  quite thin, and nutrition therapy evaluated the patient, and initiated [name] new  schedule of tube feedings.  The patient had been on bolus tube feedings at  home.  While here, he was getting continuous tube feeds of Jevity 1.275 ml  per hour.  The nutritionist has instructed the patient and his wife to  continue continuous tube feeds at home for the next few days, and eventually  to go to [name] bolus schedule, which she outlined for the patient of about 6  feedings [name] day.   Anxiety is an issue for this patient.  He has not been sleeping well, which  is perhaps [name] symptom, and also possibly [name] cause for the anxiety.  In any  event, Dr. Holland Falling, his neurologist in [place], had prescribed some Elavil,  which the patient had been reluctant to take.  However, when we finally  convinced him to take this medication, he slept quite well, and it was  suggested that he might want to use this medication more often than he is  doing.   The patient had diarrhea  during this admission.  It was Clostridium  difficile negative.  We added Imodium, and the frequency of stools  decreased, and the stool became more formed.  He did have some scant  bleeding from the rectum associated with the diarrhea.  This was not  associated with any precipitous decline in his hemoglobin and hematocrit.  Given his overall health, it was not felt necessary to pursue work-up of his  problem at this point.  If he persists in having blood in his stools or  drops his hemoglobin or hematocrit in the future, perhaps then it can be  addressed.   At the time of this dictation, the patient was in stable condition.  His  tube feeds had been advanced to 75 cc an hour, and he was tolerating these.  Plan was to send him home on October 20, 2003 if he continued to do well.  PEG  can involved once daily cleansing with [name] mixture of hydrogen peroxide and  water and wet-to-dry dressings until the PEG site was completely clear, probably within about 4-5 days.   Regarding antibiotic therapy, the patient was initially on Cipro and Flagyl  IV.  When the cultures came back as Klebsiella sensitive to multiple  antibiotics, but specifically sensitive to Cipro, we discontinued the  Flagyl.  For the last 48 hours of admission, he was on Cipro orally, and he  will continue on this for five days following discharge.   MEDICATIONS AT DISCHARGE:  1. Cipro 250  mg twice daily for 5 days via the tube.  2. Lorcet as needed for pain control via the tube.  3. Elavil 25 mg at h.s. via the tube as needed.  4. Imodium liquid or capsules 1-2 doses via tube daily as needed for loose     stools.  5. Anusol cream to the rectum as needed twice daily for rectal bleeding.   FOLLOW UP APPOINTMENT:  1. With Gregory [name] on Tuesday, October 23, 2003 at 3:15.  2. With Gregory [name] in 2-3 weeks, and, again, we are attempting to make     this appointment for the patient.      Jennye Moccasin, P.[name]. [facility]                    Lina [name], M.D. [facility]    SG/MEDQ  D:  10/19/2003  T:  10/20/2003  Job:  [id number]   cc:   Dr. Chalmers Cater(?) [facility]   Kingsley Callander. Ouida Sills, M.D.  [address]  [place]  [place] [address]  Fax: [phone]   Barbette Hair. Arlyce Dice, M.D. [facility]

## 2010-11-21 NOTE — Telephone Encounter (Signed)
Spoke with pt's spouse and advised our office did not place call to her. She verbalized understanding.

## 2010-11-21 NOTE — Op Note (Signed)
NAMEDENNYS, GUIN             ACCOUNT NO.:  [id number]   MEDICAL RECORD NO.:  [id number]          PATIENT TYPE:  INP   LOCATION:  0161                         FACILITY:  [facility]   PHYSICIAN:  Casimiro Needle B. Sherene Sires, M.D. [facility] OF BIRTH:  [birth year]   DATE OF PROCEDURE:  04/28/2004  DATE OF DISCHARGE:                                 OPERATIVE REPORT   This is a fiberoptic bronchoscopy/therapeutic procedure.   REFERRING PHYSICIAN:  Self-referred.   HISTORY AND INDICATIONS:  Please see handwritten progress notes. This is a  55 year old white male on a ventilator with ALS persistent atelectasis in  the right base and previous mucous plugs in the right chest and in the right  mainstem bronchus. The purpose of the procedure was to clear the right sided  airways of what was previously felt to be clots and mucous plugs.   The procedure was performed while the patient was on mechanical ventilation  and continuously monitored by surface ECG oximetry. He remained adequate  saturation throughout the procedure and was given a total of 4 mg of IV  Ativan and 100 mg of IV fentanyl for adequate sedation and cough  suppression.   Using additional 1% lidocaine applied to the right naris, I was able to  easily cannulate the right nares and visualize the epiglottis and cords. I  then passed the bronchoscope through the airway, beyond the balloon, with  the following findings:   The trachea, carina, and all the major airways were explored with the  following findings:  There copious mucous plugs present in the right  mainstem bronchus just beyond the take off of the right upper lobe (the  bronchus intermedius). These were foul smelling and mucopurulent in nature.  Once I had suctioned them free, the airway became obscured by bleeding from  multiple sites. I then suctioned all of the bloody clots out as much as  possible. At the end of the procedure, there was no significant airway  obstruction, but  there continued to be some oozing in the right lower lobe.   IMPRESSION:  The right lower lobe airway appears to be obscured by both  mucus and bloody clots. This is a difficult combination to overcome because  Mucomyst will make the airway more hemorrhagic as will repeated suctioning  and bronchoscopy. I therefore going to increase the PEEP to 10 and hope to  treat this conservative without additional interventions.      MBW/MEDQ  D:  04/28/2004  T:  04/28/2004  Job:  [id number]

## 2010-11-21 NOTE — Op Note (Signed)
[name], [name]             ACCOUNT NO.:  [id number]   MEDICAL RECORD NO.:  [id number]          PATIENT TYPE:  INP   LOCATION:  0199                         FACILITY:  [facility]   PHYSICIAN:  Kinnie Scales. Annalee Genta, M.D.DATE OF BIRTH:  09/29/1955   DATE OF PROCEDURE:  04/18/2004  DATE OF DISCHARGE:                                 OPERATIVE REPORT   PREOPERATIVE DIAGNOSES:  1.  End-stage ALS.  2.  Chronic respiratory hypoventilation.   POSTOPERATIVE DIAGNOSES:  1.  End-stage ALS.  2.  Chronic respiratory hypoventilation.   PROCEDURE:  Tracheostomy.   ANESTHESIA:  General endotracheal.   SURGEON:  Kinnie Scales. Annalee Genta, M.D.   COMPLICATIONS:  None.   ESTIMATED BLOOD LOSS:  Minimal.   Patient transferred from the operating room to the recovery room in stable  condition.   BRIEF HISTORY:  [name] is a 55 year old white male with a history of  ALS and progressive respiratory failure. The patient was admitted to [facility] under Dr. Marcelyn Bruins pulmonary service for management of  hypoventilation. The patient was initially managed with BIPAP.  He had  progressive respiratory failure and the ED service was consulted for  placement of a tracheostomy for long-term ventilator management. The risks  and benefits and possible complications of the procedure were discussed in  detail with the patient and his family and __________ plan for surgery which  was scheduled for April 18, 2004.   DESCRIPTION OF PROCEDURE:  The patient was brought to the operating room on  April 18, 2004, placed in supine position on the operating table and  general endotracheal anesthesia was established without difficulty. The  patient was adequately anesthetized and he was injected with 2 mL of 1%  lidocaine 1:100,000 solution epinephrine injected into the anterior neck  skin.  After adequate hypervasoconstriction and hemostasis, the patient was  prepped and draped in a sterile fashion and  a 2 cm horizontally oriented  incision was created in the anterior neck skin overlying the trachea. This  was carried through the skin and underlying subcutaneous tissue. Strap  muscles were identified and divided in the midline, they were lateralized.  This allowed direct access to the patient's airway. The patient had a large  thyroid isthmus which was divided and suture ligated with 2-0 chromic  sutures. The thyroid lobes were then lateralized. The anterior aspect of the  patient's trachea from cricoid inferior was well exposed. At the second  tracheal interspace, a horizontal incision was created in the trachea. An  inverted U shaped tracheal flap was then created and a 2-0 silk suture was  placed in the inferior aspect of the trachea.  A #6 Charnley tracheostomy  tube was then inserted without difficulty. The patient was ventilated well,  had good gas exchange and CO2 return, bilateral breath sounds. The  tracheostomy tube was then sutured in position with a 3-0 Ethilon suture and  Velcro  Trach ties were then placed. The patient was awakened from his anesthetic,  he was extubated and then transferred from the operating room to the  recovery room at [facility] in stable condition. There there was  no bleeding. Minimal blood loss with no complications.      DLS/MEDQ  D:  29/52/8413  T:  04/18/2004  Job:  [id number]

## 2010-11-21 NOTE — Op Note (Signed)
NAMEDEANDRAE, WAJDA             ACCOUNT NO.:  [id number]   MEDICAL RECORD NO.:  [id number]          PATIENT TYPE:  INP   LOCATION:  0161                         FACILITY:  [facility]   PHYSICIAN:  Casimiro Needle B. Sherene Sires, M.D. [facility] OF BIRTH:  08-19-1955   DATE OF PROCEDURE:  05/15/2004  DATE OF DISCHARGE:                                 OPERATIVE REPORT   PROCEDURE:  Fiberoptic bronchoscopy with diagnostic lavage.   HISTORY:  This is a 55 year old white male with ALS who is ventilator  dependent with persistent atelectasis on chest x-ray and episodes of  dysphagia that are felt to be due to retained mucous plugs. Bronchoscopy was  performed multiple times within the last with minimal improvement on chest x-  ray, and the issue now is whether there are recurrent retained secretions  preventing him from reexpanding the right lower lobe. Prior to the  procedure, PEEP was turned off, and the patient underwent deep suction with  minimal return.   The patient was maintained on the ventilator throughout the procedure on  100% oxygen. He received 10 mg IV Versed and 50 mcg of IV fentanyl  immediately prior to the procedure, and his lidocaine was administered  through the adaptor to his tracheostomy tube.   Using a standard flexible video bronchoscope, the tracheostomy was  cannulated using an adaptor with good visualization of the airway. The  trachea was in good position while above the carina, and the entire tracheal  bronchial tree was explored bilaterally with findings:   1.  Trachea, carina, the left sided airways were minimally abnormal with      only scant secretions present in the dependent airway  2.  However, in the right sided airways, there were pooled loose secretions      present from the right mainstem bronchus just beyond the take off of the      right upper lobe. These were copious and easily suctioned through the      bronchoscope. They did not appear purulent but rather mucoid  and      relatively loose and easily to suction. All of the airways after      suctioning were widely patent. Additionally, the right lower lobe was      lavaged with adequate return and sent for gram stain and culture.   The patient tolerated the procedure well.   IMPRESSION:  Atelectasis secondary to pooling of relatively thin mucus in  the right mainstem bronchus just beyond the take off of the right upper lobe  and should be easily suctioned by blind suctioning since the tracheostomy  tube and anatomy favor direct access bilateral blind suctioning catheters.  Therefore, we will continue basic suctioning technique and turn the PEEP off  immediately for up to 10 minutes prior to suctioning to allow the secretions  to pool proximally.      MBW/MEDQ  D:  05/15/2004  T:  05/16/2004  Job:  [id number]

## 2010-11-21 NOTE — Op Note (Signed)
[name], [name]             ACCOUNT NO.:  [id number]   MEDICAL RECORD NO.:  [id number]          PATIENT TYPE:  INP   LOCATION:  0161                         FACILITY:  [facility]   PHYSICIAN:  Casimiro Needle B. Sherene Sires, M.D. [facility] OF BIRTH:  1955-08-20   DATE OF PROCEDURE:  04/24/2004  DATE OF DISCHARGE:                                 OPERATIVE REPORT   REFERRED BY:  Self referred.   PROCEDURE:  Fiberoptic bronchoscopy diagnostic with lavage.   INDICATIONS FOR PROCEDURE:  This is a 55 year old white male with ALS now  vent dependent status post tracheostomy on October 14 with difficulty with  mucus initially with bloody mucus plugs.  He has had no active bleeding but  has persistent atelectasis in the right base posteriorly and failed to  improve on the ventilator with antibiotic therapy which was stopped  yesterday. I felt bronchoscopy was indicated to be sure there were no  residual plugs in the right lower lobe preventing aeration. He agreed to the  procedure after a full discussion of the risks, benefits, and alternatives.   The procedure was done in the ICU in his room via an adaptor while being  maintained on mechanical ventilation and continuous O2 at maximum flow rate.  He received a total of 100 mg of IV fentanyl and 4 mg of IV Ativan for  adequate sedation and cough suppression. He also received topical lidocaine  directly into the trach tube via an adaptor.   Using a standard flexible bedside bronchoscopy, I introduced the  bronchoscope through the adaptor easily with good visualization of the  trachea, carina and all the major airways with the following findings:   1.  The trachea, carina and all the left sided airways were normal.  The      tracheostomy tube appeared to be in excellent position.  2.  The right upper lobe was normal.  However, beyond the takeoff of the      right upper lobe, there was a thick mucus plug completely obstructing      the airways distal to  the right upper lobe. I was able to suction these      partially clear but there was still residual plug in the right lower      lobe beyond the takeoff of the right middle lobe.  I then administered      Mucomyst topically and encountered bleeding after approximately a one      minute dwell time for the Mucomyst.  At that point, I felt that the      bronchoscope was traumatized in the airway more than it was actually      helping remove plugs and I terminated the procedure. The patient      tolerated the procedure quite well.   No samples were available for submission at this point.   IMPRESSION:  Atelectasis on chest x-ray corresponds to tenacious plugs in  the right lower lobe. These should be easy to suction free using deep  suctioning unless the suctioning results in airway trauma. Because of the  bleeding in the airway will need to  stop the Lovenox indefinitely and to use  PAS hose for DVT prophylaxis.      MBW/MEDQ  D:  04/24/2004  T:  04/24/2004  Job:  [id number]

## 2010-11-21 NOTE — Telephone Encounter (Signed)
Did not.

## 2010-11-21 NOTE — Op Note (Signed)
NAMELUCAH, PETTA             ACCOUNT NO.:  [id number]   MEDICAL RECORD NO.:  [id number]          PATIENT TYPE:  INP   LOCATION:  0161                         FACILITY:  [facility]   PHYSICIAN:  Casimiro Needle B. Sherene Sires, M.D. [facility] OF BIRTH:  28-Sep-1955   DATE OF PROCEDURE:  05/07/2004  DATE OF DISCHARGE:                                 OPERATIVE REPORT   PROCEDURE:  Fiberoptic bronchoscopy, diagnostic with lavage.   HISTORY:  A 55 year old white male with ALS, maintained on a ventilator with  persistent atelectasis in the bases despite multiple bronchoscopies.  Repeat  bronchoscopy is indicated because of persistent abnormalities on chest x-ray  suggesting volume loss.   The procedure performed in the patient's room while he is on 100% FiO2 by  mechanical ventilation with PEEP off.  The patient was premedicated with a  total of 10 mg of IV Versed and 50 mcg of IV fentanyl after obtaining the  informed consent.   Using an adaptor via his present endotracheal tube, a standard video  fiberoptic bronchoscope was passed via the tracheostomy tube with good  visualization of the trachea and major airways.   FINDINGS:  There were copious mucopurulent secretions throughout all of the  major airways.  This was much more severe than 2 days ago when we previously  bronchoscoped him.  I suctioned all the airways free bilaterally and then  inspected them again to make sure they stayed clear.  Even after a thorough  suctioning, the dependent segments filled back up with mucopurulent  secretions and were suctioned again free.   IMPRESSION:  Bilateral atelectasis secondary to retained secretions.   RECOMMENDATIONS:  Continue daily bronchoscopy until we get ahead of this  problem and in the meantime, continue Mucomyst along with albuterol q.4h.  nebulizer per in-line treatment.      MBW/MEDQ  D:  05/07/2004  T:  05/07/2004  Job:  [id number]

## 2010-11-21 NOTE — Op Note (Signed)
NAMEDARUS, HERSHMAN             ACCOUNT NO.:  [id number]   MEDICAL RECORD NO.:  [id number]          PATIENT TYPE:  INP   LOCATION:  0158                           FACILITY:   PHYSICIAN:  Lina Sar, M.D. [facility]  DATE OF BIRTH:  1956/06/11   DATE OF PROCEDURE:  07/24/2004  DATE OF DISCHARGE:                                 OPERATIVE REPORT   PROCEDURE:  Flexible sigmoidoscopy.   INDICATIONS FOR PROCEDURE:  This 55 year old white male with ALS and  ventilator dependence was treated at home for C. difficile colitis  due to  broad spectrum antibiotics used for his pulmonary infection.  He was  hospitalized because of dehydration and frequent diarrhea.  He has been on  the last week on vancomycin 125 mg four times a day.  Nutritional supplement  was provided with Pulmocare.  The patient's diarrhea has, however, continued  and it was not clear as to whether it was automatic diarrhea due to  [facility] or whether he is still dealing with colitis.  His initial CT scan  of the abdomen showed diffuse thickening of the colon.  He also has had  Hemoccult-positive stools and tenesmus suggestive of acute proctitis.  In  order to assess the response to treatment and plans to possibly discharge  him, he is undergoing a flexible sigmoidoscopy with a limited prep using a  Fleet enema.   ENDOSCOPE:  Olympus single-channel videoscope.   SEDATION:  1.  Versed 6.5 mg IV.  2.  Fentanyl 50 mcg IV.   FINDINGS:  The Olympus single-channel videoscope was passed into the sigmoid  colon.  The patient was monitored by the pulse oximeter.  His oxygen  saturations were normal on the ventilator.  He was very cooperative.  Rectal  tone was normal to somewhat decreased.  Starting in the rectum, the mucosa  was friable, diffusely edematous bleeding on contact with the scope with  erythema which was confluent and extended from 0 to at least 20 cm.  Biopsies were taken from this area which, showed nonspecific  proctitis.  In  the sigmoid colon, between 30-40 cm, the mucosa showed patchy erythema.  This was quite nonspecific.  No definite pseudomembrane was seen.  Again,  biopsies were taken from the diarrhea.  At about 50 cm, the mucosa appeared  entirely normal with normal vesicular pattern.  The endoscope was advanced  all of the way to 80 cm.  At splenic flexure, again  mucosa appeared normal.  Biopsies were taken from normal-appearing mucosa at 70 cm.  The colonoscope  was then retracted and specimen was collected for repeat C. difficile toxin.  The patient tolerated the procedure well.   IMPRESSION:  Segmental colitis involving the rectosigmoid colon after 20 cm  with mild inflammatory changes between 30-40 cm.  Complete resolution of  previous colitis to about 50 cm, status post multiple biopsies from stool  collection for Clostridium difficile.   ADDENDUM:  The patient stool did not have an odor of C. difficile toxin.  The specimen was collected in a sputum trap and sent to microbiology.   PLAN:  1.  Continue vancomycin 125 mg p.o. q.i.d.  2.  Increase Pulmocare to 50 mL  an hour.  3.  The patient will be able to be discharged tomorrow.  4.  We will continue on Questran 4 g a day.      DB/MEDQ  D:  07/24/2004  T:  07/24/2004  Job:  [id number]   cc:   Barbette Hair. Arlyce Dice, M.D. [facility]   Marcelyn Bruins, M.D. [facility]

## 2010-11-21 NOTE — Assessment & Plan Note (Signed)
[facility]                                 ON-CALL NOTE   NAME:[name], [name]                      MRN:          [id number]  DATE:07/17/2006                            DOB:          09/06/1955    [name]' spouse called. He is ventilator dependant, has Guillain-  Barre. She says he has fever and a scratchy sore throat with purulent  sputum from his trachea. We called in Ceptor double strength 1 twice  daily for 7 days. She is to call if he worsens.     Charlcie Cradle Delford Field, MD, [facility]  Electronically Signed    PEW/MedQ  DD: 07/17/2006  DT: 07/18/2006  Job #: [id number]   cc:   Barbaraann Share, MD,FCCP

## 2010-11-21 NOTE — Assessment & Plan Note (Signed)
[facility]                                   ON-CALL NOTE   NAME:Crichlow, Kitt                      MRN:          [id number]  DATE:02/19/2006                            DOB:          [birth year]    Mr. Onstott' nurse called me, indicates he is having chest congestion but no  fever.  Sputum is clear.  He is trached and on a vent with ALS.  We called  in Levaquin 750 mg daily for a 10-day course and also for a portable chest x-  ray to be obtained.                                   Charlcie Cradle Delford Field, MD, [facility]   PEW/MedQ  DD:  02/19/2006  DT:  02/20/2006  Job #:  [id number]   cc:   Barbaraann Share, MD, [facility]

## 2010-11-21 NOTE — Op Note (Signed)
NAMEARIAS, WEINERT             ACCOUNT NO.:  [id number]   MEDICAL RECORD NO.:  [id number]          PATIENT TYPE:  INP   LOCATION:  0161                         FACILITY:  [facility]   PHYSICIAN:  Casimiro Needle B. Sherene Sires, M.D. [facility] OF BIRTH:  1955-10-07   DATE OF PROCEDURE:  05/05/2004  DATE OF DISCHARGE:                                 OPERATIVE REPORT   PROCEDURE:  Fiberoptic bronchoscopy, diagnostic, with lavage.   INDICATIONS FOR PROCEDURE:  This is a 55 year old white male on a ventilator  with persistent right lower lobe atelectasis and now fever, pleuritic right  pain, with a differential diagnosis including pneumonia.  Bronchoscopy is  being performed because he has had mucous plugs in the past and to be sure  that he does not have recurrent mucous plugs but also to sample the airways  for adequate __________ culture purposes.   DESCRIPTION OF PROCEDURE:  The procedure was performed in the bronchoscopy  suite with the patient on 100% oxygen and off of PEEP.  The patient was  premedicated with a total of 20 mg of IV Versed and 100 mcg of IV Fentanyl.  The right naris was anesthetized with 2% lidocaine jelly.   Using a standard video fiberoptic bronchoscope, the right naris was easily  cannulated, with good visualization of the entire oropharynx and larynx.  The cords were swollen together, with difficulty passing the bronchoscope  through the cords but the trachea appeared to be in good position below the  cords.  I passed the bronchoscope easily by the inflated cuff and was able  to inspect the entire tracheobronchial tree to the subsegmental level  bilaterally with the following findings.  There were copious purulent  bilateral secretions in the dependent segments, right base much greater than  left base.  These secretions, however, were very thin, with no evidence of  mucous plugging.  The underlying mucosa was minimally erythematous, and this  time as compared to previous times,  he did not have significant bleeding  during the procedure.  By the end of the procedure, all the airways were  widely patent with no residual retained secretions.   Samples were sent as follows:  (1) Bronchial lavage on the left for gram  stain and culture, (2) bronchial lavage on the right for gram stain and  culture.   The patient tolerated the procedure well, and PEEP was returned to 10 after  the procedure.      MBW/MEDQ  D:  05/05/2004  T:  05/05/2004  Job:  [id number]

## 2010-11-21 NOTE — Discharge Summary (Signed)
NAMECULLEY, HEDEEN             ACCOUNT NO.:  [id number]   MEDICAL RECORD NO.:  [id number]          PATIENT TYPE:  INP   LOCATION:  0161                         FACILITY:  [facility]   PHYSICIAN:  Marcelyn Bruins, M.D. [facility] DATE OF BIRTH:  August 08, 1955   DATE OF ADMISSION:  04/11/2004  DATE OF DISCHARGE:  06/02/2004                                 DISCHARGE SUMMARY   DISCHARGE DIAGNOSES:  1.  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 amyotrophic lateral sclerosis.  2.  Anxiety and depression.   HISTORY OF PRESENT ILLNESS:  Mr. Millett is a 55 year old white male patient  of Dr. Marcelyn Bruins with a known history of ALS with progressive respiratory  failure and hypercarbia requiring nocturnal BiPAP.  The patient has been  diagnosed with amyotrophic lateral sclerosis approximately 18 months ago.  He also has dysphagia and dysphonia secondary to ALS, and has percutaneous  indwelling endoscopic gastrostomy tube.  He presented for an office visit,  and had a 3-day history of increasing cough, congestion, and gurgling  secretions.  For that reason, he was admitted to the hospital for evaluation  for tracheostomy and mechanical ventilatory support.  Chest x-ray did  demonstrate a right lower lobe process, and he was admitted for further  evaluation and treatment.   LABORATORY DATA:  Sodium 135, potassium 3.9, chloride 99, CO2 of 32, glucose  116, BUN 17, creatinine 0.8, calcium 8.8.  WBC's 8.3, hemoglobin 10.8,  hematocrit 33.1, platelets 274.  It was noted that the sputum on May 24, 2004 was remarkable for Pseudomonas aeruginosa that was pansensitive.   RADIOGRAPHIC DATA:  Chest x-ray demonstrates no acute cardiopulmonary  findings.  There had been right lower lobe atelectatic changes.   PROCEDURES:  He has had fiberoptic bronchoscopies performed on October 20,  October 21, October 24, October 31, November 1, November 2,  November  5, and  November 10, each of these in the year 2005 during this admission for right  lower lobe atelectatic changes.  Was noted to have mucous plugging, which  improved with interventions.  He also had a right pleural effusion.  He  underwent ultrasound tap on May 05, 2004 with 400 cc.  It was consistent  with a parapneumonic effusion.  He also had a tracheostomy placed on April 18, 2004 by Dr. Osborn Coho.  It was of note that he did have Moraxella  catarrhalis on sputum culture, and was treated with vancomycin and  promazine, and Pseudomonas was treated with ciprofloxacin, which he  continues to be on for a further 9 days after discharge.  His mechanical and  ventilatory mode settings were assist control rate of 12, with tidal volume  of 650, 5 of PEEP, and 3 liter O2 bleed in.   HOSPITAL COURSE BY DISCHARGE DIAGNOSES:  1.  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 amyotrophic lateral sclerosis.  Mr. Edmar Blankenburg was admitted to [facility] where he had been BiPAP      dependent via  nasal pillows.  He was noted to have  Moraxella      catarrhalis in his sputum, and he underwent appropriate antibiotic      therapy.  He underwent tracheostomy on April 18, 2004 by Dr.      Annalee Genta, and was placed on mechanical ventilatory support consisting      of LTV950, set at tidal volume originally of 700, decreased to 650, with      a rate of 10, increased to 12, with 5 of PEEP on 3 liter oxygen bleed      in, which created saturations in the 95 percentile range.  Noted that he      had persistent right lower lobe atelectatic changes.  He had fiberoptic      bronchoscopy done on numerous occasions showing mucous plugging.  He did      grow Pseudomonas from those bronchial washings, and was treated with      ciprofloxacin for Pseudomonas after undergoing vancomycin and Primaxin      treatment for his Moraxella  catarrhalis.  He is currently on day #5 of      14 days of antimicrobial therapy with ciprofloxacin, and will continue      this on an outpatient basis for 9 more days.  2.  Anxiety and depression.  He was treated with multiple anxiolytics while      in the hospital, and also with antidepressants.  Dr. Antonietta Breach, of      the psychiatric division, was called in for his expertise, and was very      helpful in adjusting the medications to deal with his anxiety and panic      attacks secondary to being on a ventilator, and with his progressive      neuromuscular disease.  Those medications will be discussed in his      medication list on the discharge summary.   DISCHARGE MEDICATIONS:  1.  Protonix 40 mg via PEG daily.  2.  Mylanta Gas 1.2 ml via PEG two times a day.  3.  Zoloft 150 mg via PEG once a day.  4.  Duragesic 50 mcg changed every 72 hours.  5.  Klonopin 1 mg tablets, one tablet in the a.m., 2 tablets in the p.m. via      PEG tube.  6.  Ciprofloxacin 150 mg via PEG every 12 hours until completed.  7.  Xopenex 1.25 and Atrovent 0.5 via nebulizer three times a day, and the      Xopenex is 1.25 as needed with 3 extra doses during the day.  8.  Ambien 10 mg via PEG q.h.s. p.r.n.   DIET:  Six cans of Pulmicort with one scoop of ProMod three times a day.   WOUND CARE:  He will have tracheostomy care as instructed.  His home  ventilator settings are assist control 12, tidal volume of 650, with 3 liter  bleed in, and 5 of PEEP.  As for home health care, he has set up prior to  discharge a Hoyer lift with a pad, a hospital bed with overlay mattress,  tracheostomy supplies including bullet suctioning supplies, a home  ventilator with instructions as noted, a home generator with a prescription  written, PEG supplies plus feeds, a home percussion machine, a pulse  oximeter, condom cath with Foley bags, home oxygen supply with tubing, tracheostomy sponges, PEG sponges, high back  wheelchair with a gel cushion.  He will also have a home health R.N. for  24/7 care.  Home health PT, OT,  speech therapy, and respiratory therapy as needed.  He also has a home  health aid as instructed.   DISPOSITION/CONDITION ON DISCHARGE:  Ventilator-dependent respiratory  failure secondary to progressive neuromuscular disease as noted.  He will  continue with ventilatory support and home health as tolerated, and will be  evaluated on an ongoing basis by Dr. Marcelyn Bruins of the pulmonary division  of [facility], and by Dr. Avie Echevaria of neurology.  He is also  being evaluated by Dr. Jeanie Sewer of the psychiatric division, and Dr.  Annalee Genta of otolaryngology.      SM/MEDQ  D:  06/02/2004  T:  06/02/2004  Job:  [id number]   cc:   Onalee Hua L. Annalee Genta, M.D.  [address] [place]  [place] [address]  Fax: [phone]   Genene Churn. Love, M.D.  [address]  [address]  [place]  [place] [address]  Fax: [phone]   Antonietta Breach, M.D.

## 2010-11-21 NOTE — H&P (Signed)
[name], [name]             ACCOUNT NO.:  [id number]   MEDICAL RECORD NO.:  [id number]          PATIENT TYPE:  INP   LOCATION:  0158                         FACILITY:  [facility]   PHYSICIAN:  Shan Levans, M.D. LHCDATE OF BIRTH:  Dec 08, 1955   DATE OF ADMISSION:  07/20/2004  DATE OF DISCHARGE:                                HISTORY & PHYSICAL   REASON FOR ADMISSION:  Abdominal pain, diarrhea, rule out C. difficile  colitis.   HISTORY OF PRESENT ILLNESS:  This is a 55 year old white male with history  of ALS, ventilator-dependent.  He was hospitalized between April 11, 2004  and June 02, 2004 for ventilator-dependency.  During this  hospitalization, he had complications including Pseudomonas infection and  colonization, MRSA infection.  He had been treated with multiple rounds of  antibiotics and did develop a bout of diarrhea while in the hospital.  After  discharge home, he had diarrhea which was positive for Clostridium difficile  toxins.  This was on May 13, 2004.  There was a negative C. difficile  stool toxin titer, but apparently an outpatient C. difficile toxin titer was  positive.  I do not have the results of this.  Nevertheless, the patient was  treated with a course of Flagyl orally as an outpatient but apparently  diarrhea has progressed and recurred.  He had a fever up to 101 yesterday  now 100.5 today.  Notes some small amounts of blood in the stool and  increased abdominal, flank pain and diarrhea.  For this reason, he is  brought to the hospital for further inpatient evaluation.   This patient also has been placed on mechanical ventilator support in the  home environment and currently has the setting of a tidal volume of 650,  assist control of 12, three liter O2, and 5 PEEP.   PAST MEDICAL HISTORY:  Medical history of ALS diagnosed last fall, now on  ventilator support since October 2005 with the home ventilator.   MEDICATIONS:  Zoloft, Flagyl,  Klonopin, Xopenex, home ventilator, Duragesic  patch (recently has been discontinued), Protonix 40 mg q.d., Mylanta two  times daily, Ambien HS p.r.n.  Also gets six cans of Pulmicort, one scoop of  ProMod three times daily via PEG.   PHYSICAL EXAMINATION:  VITAL SIGNS:  Temperature maximum 100.  Vital signs  stable.  GENERAL:  Well-developed, well-nourished white male on mechanical ventilator  support in no distress.  CHEST:  Showed few scattered rhonchi.  CARDIOVASCULAR:  Regular rate and rhythm with S3.  Normal S1 and S2.  Resting tachycardia noted.  No murmur, rub, heave, or gallop.  ABDOMEN:  Soft, nontender.  Slightly protuberant.  Bowel sounds are  hyperactive.  EXTREMITIES:  No edema, clubbing, or venous disease.  NEUROLOGICAL:  Intact.  NECK:  No jugular venous distention.  No lymphadenopathy.  Oropharynx clear.  Neck supple.   LABORATORY DATA:  Pending at the time of dictation.   IMPRESSION:  ALS with ventilator dependency, now with associated fever and  abdominal pain:  Rule out infection with Clostridium difficile toxin.   PLAN:  Plan for this will  be to obtain a repeat Clostridium difficile toxin  titer.  To give IV fluid resuscitation. Administer oral vancomycin.  Have  gastroenterology to consult on this patient.  Check sputum CNS and Gram-  stain.      PW/MEDQ  D:  07/20/2004  T:  07/20/2004  Job:  [id number]

## 2010-11-21 NOTE — Op Note (Signed)
NAMEBILLYE, NYDAM             ACCOUNT NO.:  [id number]   MEDICAL RECORD NO.:  [id number]          PATIENT TYPE:  INP   LOCATION:  0161                         FACILITY:  [facility]   PHYSICIAN:  Casimiro Needle B. Sherene Sires, M.D. [facility] OF BIRTH:  Jun 22, 1956   DATE OF PROCEDURE:  04/25/2004  DATE OF DISCHARGE:                                 OPERATIVE REPORT   Bronchoscopy, diagnostic with lavage.   HISTORY:  Please see yesterday's bronchoscopy note. This patient had quite a  bit of bleeding from attempted therapeutic suctioning of mucous plug from  the right lower lobe and therefore with worsening aeration in the right base  with the presumption that bloody mucous plugs had formed in the area where  he previously had mucopurulent secretions retained. I felt repeat  bronchoscopy with a larger scope was indicated to see if we could completely  clear the airway of blood.   The procedure was performed while on the ventilator. We could not pass the  larger bronchoscope through his tracheostomy tube, so applied topical  anesthesia in the form of 2% lidocaine to the right areas and passed the  bronchoscope easily through the right nares with good visualization of the  entire oropharynx and larynx. The cords did not appear to move well at all.  In fact, it was difficult to pass the bronchoscope through the cords;  however, once through the cords, the trach and the balloon were well  visualized and bronchoscope was passed into the airway with the following  findings:   There were copious retained secretions bilaterally. These were more bloody  than purulent. I suctioned them all free, especially from the right lower  lobe. There was minimal residual airway erythema with no residual  obstruction.   I selectively lavaged the right lower lobe and sent it for gram stain and  culture.   The patient tolerated the procedure well. Followup chest x-ray is planned  for the morning. He will be maintained on  mechanical ventilation throughout  the next 24 hours.   FINAL DIAGNOSIS:  Atelectasis secondary to mucous plug.      MBW/MEDQ  D:  04/25/2004  T:  04/25/2004  Job:  [id number]

## 2010-11-21 NOTE — Consult Note (Signed)
[name], [name]             ACCOUNT NO.:  [id number]   MEDICAL RECORD NO.:  [id number]          PATIENT TYPE:  INP   LOCATION:  0161                         [facility]ACILITY:  [facility]   PHYSICIAN:  Genene Churn. Love, M.D.    DATE OF BIRTH:  March 02, 1956   DATE OF CONSULTATION:  05/02/2004  DATE OF DISCHARGE:                                   CONSULTATION   REASON FOR CONSULTATION:  This 55 year old, right handed, white, married  male from [place], [place] is seen in consultation at the request  of Dr. Marcelyn Bruins for evaluation of acute and chronic respiratory failure  in the setting of neuromuscular disease.   HISTORY OF PRESENT ILLNESS:  [name] has a history of viral illness  developing in the fall of 2002 subsequently followed by progressive speech  disorder in March of 2003. This was associated subsequently with dysphagia,  progressive shortness of breath, limb weakness and most recently weight loss  from 220 to 145 pounds.  He was initially seen at [facility] after being evaluated by Dr. Shelle Iron for restrictive lung disease.  There he was noted to have a voice typical of a neuromuscular disorder and  suspected ALS.  He underwent EMG and nerve conduction studies at Dr.  Gaynell Face C. Freeman's office on February 14, 2002 which showed normal nerve  conduction studies but fasciculations greater than fibrillations in all  extremities.  No cranial nerve fasciculations were noted. The study was  thought to indicated forelimb denervation and was consistent with the  diagnosis of motor neuron disease.  The patient was seen by myself or the  first time January 12, 2002 for evaluation of voice problems, at that time did  not have clinical fasciculations on exam but did have hyperreflexia. He has  been followed at [facility] by Dr. Alphonzo Dublin  with deterioration in his motor skills.  Approximately summer of 2004, he  was placed on BIPAP  and recently has been using it throughout the night. In  February of 2005, he was able to play golf with a friend but then began his  weight loss and in April of 2005 had a PEG tube placed.  Subsequently, he  required a walker beginning in July of 2005. He was admitted April 11, 2004  with acute and chronic respiratory failure, right lower lobe atelectasis and  pneumonia and underwent trach April 18, 2004, __________ May 02, 2004.  He has been on a respirator since his hospitalization.  He has undergone a  bronchoscopy April 28, 2004 and has planned bronchoscopy this next week.  His medication regimen includes Pulmicort, Cipro, Dextrose, guaifenesin,  lorazepam, Bactroban, Pulmocare, Protonix, protein supplement, Zoloft,  __________, and acetaminophen. Currently he is on a respirator day and  night.   LABORATORY DATA:  Laboratory studies most recently May 02, 2004 have  included pH of 7.420, pCO2 of 57.4, PO2 of 58.3 on 4 liters per minute.  His  white blood cell count May 01, 2004 was 16,200, hemoglobin 10.2,  hematocrit 30.9, platelet count 299,000.  May 01, 2004, sodium  136,  potassium 3.9, chloride 94, CO2 content 35, glucose 153, BUN 9, creatinine  0.4, calcium 8.6.  Last urinalysis unremarkable. Prealbumin 8.1 on October  22, acetycholine receptor antibody is zero.   PHYSICAL EXAMINATION:  The patient not examined because asleep, first sleep  he had had in two days and was being seen by me at 9:45 p.m.  His blood  pressure was 105/60, heart rate 70, respiratory rate 110, saturation 99%.  He has fasciculations that I could observe in his trapezius muscles, in his  upper arms in the biceps, in the lower extremities bilaterally proximally  and distally. He had clonus of his left ankle.  Note, the rest of the  examination was not performed.   IMPRESSION:  ALS with chronic and acute respiratory failure.   PLAN:  Followup patient with you and support family  decisions regarding end  of life issues. We will perform Tensilon test at their request if you feel  it is safe in view of potential for increased secretions.      JML/MEDQ  D:  05/02/2004  T:  05/03/2004  Job:  [id number]

## 2010-11-21 NOTE — H&P (Signed)
NAMECURLEY, HOGEN             ACCOUNT NO.:  [id number]   MEDICAL RECORD NO.:  [id number]          PATIENT TYPE:  INP   LOCATION:  0354                         [facility]ACILITY:  [facility]   PHYSICIAN:  Marcelyn Bruins, M.D. [facility] DATE OF BIRTH:  08-12-1955   DATE OF ADMISSION:  04/11/2004  DATE OF DISCHARGE:                                HISTORY & PHYSICAL   CHIEF COMPLAINT:  Coughing, shortness of breath, and choking on secretions  for 3 days.   HISTORY OF PRESENT ILLNESS:  The patient is a very pleasant 55 year old  white male patient of Dr. Teddy Spike who has a known history of ALS with  progressive respiratory failure and hypercarbia requiring nocturnal BiPAP.  The patient has appropriately been diagnosed with ALS somewhere around a  year to year and a half. The patient also has dysphagia and dysphonia  secondary to his ALS and now has a PEG tube. The patient presents for an  acute office visit related to a 3-day history of increasing cough,  congestion, gurgling secretions. The patient has a history of difficulty  managing his excessive secretions, and it has been progressively worsened  over the last 3 days. He complains of progressive shortness of breath,  diaphoresis, and weakness. Chest x-ray on the office today did not show any  acute changes; however, the patient does appear to be in mid respiratory  distress. He denies any chest pain, fever, hemoptysis, abdominal pain, leg  swelling. The patient does have a PEG tube and is followed by Dr. Arlyce Dice in  lower GI. He has had a recent cellulitis at the PEG site and has just  finished a 7-day course of Cipro yesterday. This has substantially improved  with no further redness. The patient will require hospitalization for IV  antibiotics and close monitoring of his respiratory status.   PAST MEDICAL HISTORY:  1.  Seasonal allergies.  2.  Status post tonsillectomy.  3.  IBS.   CURRENT MEDICATIONS:  1.  Zoloft 75 mg daily.  2.  Protein  shakes daily.  3.  Pulmocare 6 cans daily.  4.  Coenzyme Q10 daily.  5.  The patient also has BiPAP 6/12 cm nocturnally.  6.  Ativan 0.5 mg p.r.n.   The patient had previously been on Nexium but had discontinued this over the  last month for unknown reasons. Also had been prescribed Reglan; however, he  states he was intolerant to this due to diarrhea.   DRUG ALLERGIES:  BENTYL, OMNICEF, FLAGYL.   FAMILY HISTORY:  Positive for hypertension.   SOCIAL HISTORY:  The patient is married. Currently disabled. Has 2 children.   REVIEW OF SYSTEMS:  NEUROMUSCULAR:  Positive for dysphonia, dysphagia, and  muscle weakness. Negative for urinary or bowel incontinence. Also negative  for abdominal pain, nausea, vomiting, diarrhea.   PHYSICAL EXAMINATION:  GENERAL:  The patient is a thin, tall, white male  mild respiratory distress.  VITAL SIGNS:  Temperature 96.1, blood pressure 110/68, pulse sinus  tachycardia at 130, respiratory rate 20, O2 saturation 93% on room air.  HEENT:  Atraumatic and normocephalic. PERRL. Nasal mucosa pink and  moist.  Posterior pharynx is clear.  NECK:  Supple without cervical adenopathy. No JVD. Carotids are equal with  positive upstrokes bilaterally without any bruits. No thyromegaly or nodules  appreciated.  LUNGS:  Sounds reveal coarse rhonchi throughout. No wheezing noted.  CARDIAC:  S1 and S2 without murmur or gallop.  ABDOMEN:  Flat, soft, without any ___________. The patient does have a PEG  in place. There is no surrounding erythema or drainage at the site.  EXTREMITIES:  Warm without any calf tenderness, cyanosis, or clubbing. There  is none to trace edema.  NEUROLOGICAL:  The patient does have dysphonia and bilateral weakness.   IMPRESSION AND PLAN:  Acute on chronic respiratory failure with hypercarbic  respiratory failure. The patient will be admitted to the hospital for close  monitoring. Will be started on IV antibiotics including Avelox. Robitussin   per his feeding tube. Oxygen and continue on his nocturnal BiPAP. Labs are  pending.      TP/MEDQ  D:  04/11/2004  T:  04/11/2004  Job:  [id number]   cc:   Kingsley Callander. Ouida Sills, M.D.  [address]  [place]  [place] [address]  Fax: [phone]   Barbette Hair. Arlyce Dice, M.D. [facility]

## 2010-11-21 NOTE — H&P (Signed)
NAME:  [name], [name]                       ACCOUNT NO.:  [id number]   MEDICAL RECORD NO.:  [id number]                   PATIENT TYPE:  INP   LOCATION:  5740                                 FACILITY:  [facility]   PHYSICIAN:  Lina Sar, M.D. [facility]               DATE OF BIRTH:  1956/01/13   DATE OF ADMISSION:  10/15/2003  DATE OF DISCHARGE:                                HISTORY & PHYSICAL   NEUROLOGIST:  His neurologist in [place], [place], is Dr.  Holland Falling.   PRIMARY CARE PHYSICIAN:  His primary care physician in [place], [place], is Kingsley Callander. Ouida Sills, M.D.   GASTROENTEROLOGIST:  Barbette Hair. Arlyce Dice, M.D.   CHIEF COMPLAINT:  Pain and swelling at PEG site with low-grade temperatures.   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  He had been experiencing  weight loss in the last 10-12 months, going from 210 pounds to 156 pounds.  He is on [name] regular diet, but he eats so slowly that he had been unable to  meet his nutritional/caloric needs.  On Friday, October 05, 2003, Dr. Arlyce Dice  placed [name] PEG.  He did receive preoperative vancomycin as he does have [name]  cephalosporin allergy.  The patient did well tolerating the tube feeds, but  did notice some pain and redness at the PEG site within several days.  Within the last two to three days, induration the size of [name] golf ball was  noted and this has expanded to [name] softball size today.  There has not been  any obvious pusy discharge.  The patient's family was in touch with Dr.  Luan Pulling within the last several days and she gave the patient [name]  prescription for p.r.n. Lortab, but has not been using this.   Last week, he was seen by Dr. Humberto Seals, his neurologist at [facility].  Dr.  Humberto Seals looked at the PEG site and advised the family to bring the patient to  the ED if the erythema increased or if pain accelerated.  Blood was drawn  for various tests,  including [name] CBC and his white count at that time was  11.8.  Over the weekend, pain had become worse and the induration appeared.  So he came to the emergency room overnight.  Temperature was afebrile,  though within the last few days he has had [name] temperature to 100.5 degrees.  His white count is now 14,600.  On exam, there is obvious abscess with  purulent discharge expressible in an indurated area around the PEG.  The  patient does complain of some shortness of breath without cough.  He was  given [name] Gastrografin via the PEG and the positioning of the PEG looks good  with contrast entering the stomach and into the duodenum in [name] normal  pattern.  After the Gastrografin had been administered, the  patient did have  [name] loose bowel movement, but generally has not had any diarrhea.  He is  complaining of [name] clenching in his jaw, which is new, and some shaking  chills.   PAST MEDICAL HISTORY:  1. Amyotrophic lateral sclerosis.  2. History of irritable bowel syndrome.  Mostly constipation prone.  This     was several years ago.   PAST SURGICAL HISTORY:  1. Status post tonsillectomy.  2. Status post wisdom teeth extraction.  3. He may have [name] colonoscopy by Ulyess Mort, M.D., in the past.   ALLERGIES:  1. BENTYL.  2. OMNICEF.   MEDICATIONS:  Does not take any regularly.  Had been using some p.r.n.  Tylenol and he got the prescription for Lorcet, but use this maybe once or  twice last week.   SOCIAL HISTORY:  The patient lives with his wife.  She and the couple's son  are attentive to the patient.  He does not smoke or drink.   FAMILY HISTORY:  Noncontributory.  There is coronary artery disease in [name]  grandmother, prostate cancer in [name] grandfather who is 42 years old, and  hypertension in his father.   REVIEW OF SYSTEMS:  NEUROLOGIC:  Up until [name] week or so before the PEG, the  patient had been driving, but he became so weak that he has had to resort to  using [name] walker recently.  He  did have [name] fall at home because of weakness [name]  day or two before the PEG was placed.  He is unable to have effective  vocalization due to weakness from the ALS.  He is unable to swallow pills,  but can swallow elixirs.  PULMONARY:  About two months ago, the patient was  started on nocturnal BIPAP and uses this about five hours during sleep  hours.  GENERAL:  Weight loss as described above.  The rapid weight loss  seems to have subsided and in the last few weeks weight  has been stable.  He is feeling week.  There has not been any dizziness.  GENITOURINARY:  No  urinary symptoms.  No oliguria or urinary frequency.  CARDIOVASCULAR:  No  palpitations.  No chest pain.  Perhaps [name] little bit of pedal edema.  His  wife has noticed that his feet are [name] bit cool.  METABOLIC:  No history of  diabetes.  There have been increased copper levels in hair sample testing.  When he was at [facility] last week, they obtained serum copper levels, but  those results are not yet known.  No family history of Wilson's disease,  however.  Other review of systems were negative.   PHYSICAL EXAMINATION:  VITAL SIGNS:  The temperature is 98.6 degrees, blood  pressure 138/76, pulse 108, respirations 16-20, and room air saturation 94%.  HEENT:  Oropharynx:  The oral mucosa is pink and moist.  There is [name] coating  of yeast on the tongue.  Eyes:  Extraocular movements are intact.  There is  no conjunctival pallor.  NECK:  There is no adenopathy or thyromegaly.  CHEST:  Breath sounds are decreased, but clear.  CORONARY:  There is regular rate and rhythm.  No murmurs, rubs, or gallops.  ABDOMEN:  Thin and soft.  There is tenderness at the PEG site and in the  area surrounding the PEG.  Induration is palpable.  Initially no pus was  oozing, but once the outer bumper of the PEG was loosened (it was quite  tight), bloody purulent material was easily expressible.  Bowel sounds are  active.  RECTAL:  Exam was deferred.   GENITOURINARY:  Exam was deferred.  EXTREMITIES:  There is slight nonpitting edema in the feet.  His feet are [name]  bit cool.  Dorsalis pedis pulses 3+ bilaterally.  Capillary refill is rapid  to the toes.  NEUROLOGIC:  The patient is alert and oriented x 3.  He is not able to  speak, but obviously understands all questions and uses some sign language  with his family.  No resting tremor.  Grip and pedal strength are 5/5  bilaterally.  Strength in the buccal and jaw muscles is weak.  He is unable  to puff out his cheeks.  He is unable to stick out his tongue.  Shoulder  shrug strength is 5/5.   LABORATORIES:  The white blood cell count is 14,600, hemoglobin 14.6,  hematocrit 42.8, platelets 284,000, and MCV 90.2.  The ISTAT electrolytes  were as follows:  Sodium 134, potassium 3.9, BUN 9, creatinine 1.0, and  glucose 125.  The pH was normal at 7.395, pCO2 elevated at 58.9, and  bicarbonate elevated at 36.1.   ASSESSMENT:  1. Percutaneous gastrostomy site abscess.  No evidence for malpositioning of     the percutaneous gastrostomy based on Gastrografin study this morning.  2. Amyotrophic lateral sclerosis with dysphagia and speech difficulty.  3. Hypercarbia for which he is on BIPAP at h.s.  4. Hyperglycemia.  5. Oral Candida.  6. Minor hyponatremia.   PLAN:  1. I have sent [name] culture and sensitivity of the material oozing from the     abscess and will wait for Gram's stain results on this before beginning     antibiotics.  He is cephalosporin allergic, so will not use any     penicillin or cephalosporin-type drugs in this patient.  2. Demerol, Phenergan, and Tylenol as needed for symptom control and also     add clotrimazole troches for the oral Candida.  3. IV fluids have been started and will follow the CBC and BMET.  4. Plan to hold tube feeds.  5. Will continue routine nocturnal BIPAP.  6. Obtain CT scan of the abdomen with contrast to assess extent of the     abscess.       Jennye Moccasin, P.[name]. [facility]                   Lina Sar, M.D. [facility]    SG/MEDQ  D:  10/15/2003  T:  10/15/2003  Job:  [id number]

## 2010-11-21 NOTE — Op Note (Signed)
[name], [name]             ACCOUNT NO.:  [id number]   MEDICAL RECORD NO.:  [id number]          PATIENT TYPE:  INP   LOCATION:  0161                         FACILITY:  [facility]   PHYSICIAN:  Casimiro Needle B. Sherene Sires, M.D. [facility] OF BIRTH:  Mar 24, 1956   DATE OF PROCEDURE:  05/08/2004  DATE OF DISCHARGE:                                 OPERATIVE REPORT   PROCEDURE:  Fiberoptic bronchoscopy, therapeutic, with lavage.   HISTORY AND INDICATIONS:  Please see handwritten notes.  This is a 55-year-  old white male with ALS with continued problems with retained secretions.  So far, all we have cultured out is B. catarrhalis, __________ positive.  It  should have responded to Cipro, but he continues to have trouble with  copious secretions and atelectatic segments on his x-ray consistent with  mucus plugs.  A bronchoscopy is therefore being accomplished on a daily  basis this week to try to maximize airway function by minimizing retained  secretions.   The procedure performed in the patient's room on 100% oxygen by mechanical  ventilation with PEEP off for 5 minutes and the patient suctioned prior to  the procedure.  He received topical lidocaine into an introducer via his  endotracheal tube, and then a standard fiberoptic bronchoscope was  introduced via the tracheostomy tube with the following findings:  1.  The tracheostomy tube was in good position well above the carina.  2.  There were copious secretions, much more on the right side than the left      side, but all of these were very easily suctioned free.  In fact, I did      not need to flex or extend the bronchoscope to have the bronchoscope      advanced directly into the main pool of mucus which bubbled up from the      right mainstem bronchus just below the takeoff of the right upper lobe.      I was able to suction the airway free, in fact, without doing any      flexing or extending of the bronchoscope at all.  There were minimal    residual secretions on the right at the end of the blind bronchoscopy      and minimal also secretions on the left.  These were sent for Gram stain      and culture.   IMPRESSION:  Copious retained secretions.  I suspect that turning the PEEP  off delivered more mucus up into the proximal bronchial tree, and actually  note that we did not really need to aggressively flex or extend the  bronchoscope to effectively remove all the retained secretions.   I am therefore going to recommend that we stop the bronchoscopy at this  point, continue to do suctioning by leaving the PEEP off for at least 5  minutes before each time we suction him and continue Primaxin which should  cover __________ positive B. catarrhalis as well as other hospital-acquired  organisms (note that there is no evidence of methicillin-resistant  Staphylococcus aureus to date, and reculture was done today off of  vancomycin  to see if methicillin-resistant Staphylococcus aureus is an issue  but so far, it has not been).   These findings were discussed with his wife in detail.  We will continue to  follow the patient also with daily chest x-rays.     MBW/MEDQ  D:  05/08/2004  T:  05/08/2004  Job:  [id number]

## 2010-11-21 NOTE — Telephone Encounter (Signed)
KC, you did not call this pt did you? I see nothing pending on him- pls advise thanks!

## 2010-12-17 ENCOUNTER — Other Ambulatory Visit: Payer: Self-pay | Admitting: Pulmonary Disease

## 2010-12-23 ENCOUNTER — Telehealth: Payer: Self-pay | Admitting: Pulmonary Disease

## 2010-12-23 NOTE — Telephone Encounter (Signed)
Spoke with [name]. She states that pt has been c/o rt side CP x 6 days. Pain comes and goes. Pressure on vent has been going up and down. She states that she is needing order faxed to her attn at [phone] for pt to have portable cxr. KC, pls advise if okay to send order thanks!

## 2010-12-23 NOTE — Telephone Encounter (Signed)
Ok to send order for portable cxr.

## 2010-12-24 NOTE — Telephone Encounter (Signed)
Unaware megan and i were working on the same message.  Spoke with vicki, informed her of the same.  megan requested 1v or 2v?  Per vicki, this is up to [facility] discretion.  Per KC, 1 view.  Dx code chest pain, 786.50.  Order faxed to verified number.  Will have order scanned to pt's chart.

## 2010-12-24 NOTE — Telephone Encounter (Signed)
Called and spoke with pt's nurse vicky and informed her ok for pt to have portable cxr.  Order faxed to [phone]

## 2010-12-28 DIAGNOSIS — G1221 Amyotrophic lateral sclerosis: Secondary | ICD-10-CM

## 2010-12-28 DIAGNOSIS — Z930 Tracheostomy status: Secondary | ICD-10-CM

## 2010-12-28 DIAGNOSIS — Z5181 Encounter for therapeutic drug level monitoring: Secondary | ICD-10-CM

## 2010-12-28 DIAGNOSIS — E119 Type 2 diabetes mellitus without complications: Secondary | ICD-10-CM

## 2010-12-28 DIAGNOSIS — J151 Pneumonia due to Pseudomonas: Secondary | ICD-10-CM

## 2010-12-28 DIAGNOSIS — Z9911 Dependence on respirator [ventilator] status: Secondary | ICD-10-CM

## 2010-12-28 DIAGNOSIS — Z452 Encounter for adjustment and management of vascular access device: Secondary | ICD-10-CM

## 2010-12-28 DIAGNOSIS — Z931 Gastrostomy status: Secondary | ICD-10-CM

## 2010-12-29 ENCOUNTER — Telehealth: Payer: Self-pay | Admitting: Pulmonary Disease

## 2010-12-29 NOTE — Telephone Encounter (Signed)
Returning call to [name] from this morning. I do not see were Aundra Millet called, will forward to [facility] for review.

## 2010-12-29 NOTE — Telephone Encounter (Signed)
Called and spoke with pt's wife.  Wife aware of cxr results- per [facility] no pneumothorax.  Informed wife of this.  She verbalized understanding and denied any questions.

## 2010-12-30 ENCOUNTER — Telehealth: Payer: Self-pay | Admitting: Pulmonary Disease

## 2010-12-30 MED ORDER — CIPROFLOXACIN HCL 750 MG PO TABS: ORAL_TABLET | ORAL | Status: DC

## 2010-12-30 NOTE — Telephone Encounter (Signed)
Spoke with [name] and notified of recs per KC. She verbalized understanding and rx for cipro was sent to pharm.

## 2010-12-30 NOTE — Telephone Encounter (Signed)
Let them know that a low grade temp isn;t really until 100.5, but it sounds like he has more purulent mucus.  His cxr shows no change from his prior, but he doesn't have a lot of reserve. Can treat him with cipro 750mg  per tube bid for 7 days with no fills.

## 2010-12-30 NOTE — Telephone Encounter (Signed)
Spoke with pt.  She states that pt has been c/o lowgrade temp- 99.7.When nurse suctioned him sputum was yellow. She states that he has also c/o HA and body aches, has been taking lortab for this. Wants recs from [facility].  Please advise, thanks!

## 2011-01-02 ENCOUNTER — Other Ambulatory Visit: Payer: Self-pay | Admitting: Gastroenterology

## 2011-01-03 ENCOUNTER — Telehealth: Payer: Self-pay | Admitting: Critical Care Medicine

## 2011-01-03 DIAGNOSIS — J189 Pneumonia, unspecified organism: Secondary | ICD-10-CM

## 2011-01-03 NOTE — Telephone Encounter (Signed)
This pt is not tolerating cipro, more congested , CXR with more infiltrates  Will order primaxin IV however the pt may need hospitalization

## 2011-01-05 ENCOUNTER — Telehealth: Payer: Self-pay | Admitting: Pulmonary Disease

## 2011-01-05 NOTE — Telephone Encounter (Signed)
Called spoke with [place] who verified that she was only able to draw enough blood for pt's CBC-D and she will attempt to get the [facility] tomorrow.  Will forward to [facility] as FYI.

## 2011-01-05 NOTE — Telephone Encounter (Signed)
Called, spoke with [name].  She has several questions for KC: 1.  States order for imipenem was for via midline.  However, this was not placed this weekend bc when [facility] came out they were under the impression he already had a midline.  They did insert a #24 gauge peripheral IV that is working and site WNL.  Dr. Izola Price was requesting [name] call this am to f/u on the midline -- ? If KC still wants to proceed with this.   2.  Also, the abx order was placed for q12h did not specify how many days pt will need to be on the imipenem.  [name] would like clarification on this.   3.  would like to know if KC would like any blood work/cxr after the abx is completed.    [name] states pt still sounds congestion but not as bad.  States his temp has been 99.4-99.9 axillary for the last 2 days -- have been giving Tylenol prn.

## 2011-01-05 NOTE — Telephone Encounter (Signed)
Let them know that if iv is working well, and doesn't have to be replaced but one more time, would avoid midline.  His primaxin should run for 7 days. Does not need followup labwork on cxr after abx finished.

## 2011-01-05 NOTE — Telephone Encounter (Signed)
Also, let them know that his cxr by report is essentially unchanged by their description.  They are not comparing to his last xray after being in hospital, but the last one they did.  He has significant atelectasis and plugging at bases that is to be expected with his condition.  I suspect he does not have pneumonia, but I am willing to treat with abx for possible worsening bronchitis.

## 2011-01-05 NOTE — Telephone Encounter (Signed)
Dr Delford Field sent an order to do IV antibiotics order given to [facility] to do this and to do home health he did verbal over the weekend and written order was given on Monday

## 2011-01-05 NOTE — Telephone Encounter (Signed)
Louellen Molder, nurse with [facility], is calling to f/u phone note over the weekend.

## 2011-01-05 NOTE — Telephone Encounter (Signed)
Called, spoke with [name]. She was informed, per KC,if iv is working well, and doesn't have to be replaced but one more time, would avoid midline. His primaxin should run for 7 days.  Does not need followup labwork on cxr after abx finished Also aware KC suspect he does not have pna but he is willing to treat with abx for possible worsening bronchitis.   [name] verbalized understanding of this.

## 2011-01-06 ENCOUNTER — Telehealth: Payer: Self-pay | Admitting: Pulmonary Disease

## 2011-01-06 NOTE — Telephone Encounter (Signed)
KC, pls advise if okay to provide a letter for the pt. Thanks!

## 2011-01-13 ENCOUNTER — Telehealth: Payer: Self-pay | Admitting: Pulmonary Disease

## 2011-01-13 NOTE — Telephone Encounter (Signed)
Spoke with caregiver. She states labs were faxed on Friday for [facility] to review. Per KC, he has not seen these, so I gave her the triage faxline to fax these to today.Will hold in triage.

## 2011-01-13 NOTE — Telephone Encounter (Signed)
Forms given to [facility]. Carron Curie, CMA

## 2011-01-13 NOTE — Telephone Encounter (Signed)
Called and spoke with pt's nurse, Lynden Ang, and informed her labwork ok.  Vicky verbalized understanding and denied any questions.  Nothing further needed.

## 2011-01-13 NOTE — Telephone Encounter (Signed)
Megan, please let them know the labwork is ok.

## 2011-01-13 NOTE — Telephone Encounter (Signed)
Talked with wife, states didn't need note after all.

## 2011-01-19 ENCOUNTER — Encounter: Payer: Self-pay | Admitting: Pulmonary Disease

## 2011-02-06 DIAGNOSIS — J189 Pneumonia, unspecified organism: Secondary | ICD-10-CM

## 2011-02-06 DIAGNOSIS — G1221 Amyotrophic lateral sclerosis: Secondary | ICD-10-CM

## 2011-02-06 DIAGNOSIS — Z930 Tracheostomy status: Secondary | ICD-10-CM

## 2011-02-06 DIAGNOSIS — Z452 Encounter for adjustment and management of vascular access device: Secondary | ICD-10-CM

## 2011-03-05 ENCOUNTER — Telehealth: Payer: Self-pay | Admitting: Gastroenterology

## 2011-03-05 NOTE — Telephone Encounter (Signed)
Pt is a ventilator dependent ALS pt with Peg tube for nutrition. Dr Arlyce Dice, other than hospital f/u, I don't know when you saw the pt last, but the private nurse stated you placed the Peg tube on 10/05/2003. Pt has had only one episode of problems and it was like this- bleeding around the Peg site- and a cream cleared it right up. Nurse Vicky doesn't remember the name of the cream and [facility] script records only go back 2 years. Please advise on tx for the bleeding. Thanks.

## 2011-03-05 NOTE — Telephone Encounter (Signed)
No further Rx.  If he rebleeds significantly I need to see him.  If minor can use some peroxide at G tube site

## 2011-03-05 NOTE — Telephone Encounter (Signed)
Notified Vicky, pt's nurse, that Dr Arlyce Dice refers to see the pt if the problem persists. For now, if the bleeding is minor, she can use Peroxide at the G tube site. Vicky stated understanding and stated the area isn't bleeding now, but she will monitor it and call for worsening.

## 2011-03-23 ENCOUNTER — Telehealth: Payer: Self-pay | Admitting: Pulmonary Disease

## 2011-03-23 MED ORDER — AMOXICILLIN-POT CLAVULANATE 875-125 MG PO TABS: ORAL_TABLET | ORAL | Status: AC

## 2011-03-23 NOTE — Telephone Encounter (Signed)
ATC, someone answered and then hung up, called back several more times and line was busy, [facility]

## 2011-03-23 NOTE — Telephone Encounter (Signed)
Spoke with pt's nurse. She states that pt has been having frothy, thick yellow sputum from trach for the past 3 days. She states that area also has mild foul odor. 2 days ago he had low grade temp of 99.3. He c/o overall feeling achy and so they have been giving him tylenol so no more fever since then. Nurse states noted more prominent rales/rhonchi on rt side and she questions whether or not needs a portable cxr done. Also she wanted KC to know pt finished 10 day course of cipro on 03/21/11 for UTI. KC, pls advise recs thanks! Allergies  Allergen Reactions  . Amitriptyline Hcl     Elavil  . Cefdinir     omnicef  . Cephalosporins   . Dicyclomine Hcl     Bentyl  . Glutathione   . Jevity   . Latex   . Levofloxacin     Levaquin  . Sulfamethoxazole W/Trimethoprim     Septra

## 2011-03-23 NOTE — Telephone Encounter (Signed)
Spoke with pt's nurse Larene Beach and notified of recs per [facility]. She verbalized understanding, states that the pt is not allergic to PCN and so rx for augmentin was sent to pharm.

## 2011-03-23 NOTE — Telephone Encounter (Signed)
He has grown pseudomonas from secretions the last 2 times, all of which was sensitive to cipro.  He just finished cipro. This may simply be a tracheiitis that is not due to pseudomonas.  I see he is allergic to cephalosporins, but see if he is allergic to penicillin. If not,then augmentin 875 per peg bid for next 7days.

## 2011-03-23 NOTE — Telephone Encounter (Signed)
ATC again x 3 and line still busy

## 2011-03-30 ENCOUNTER — Inpatient Hospital Stay (HOSPITAL_COMMUNITY)
Admission: EM | Admit: 2011-03-30 | Discharge: 2011-04-04 | DRG: 872 | Disposition: A | Discharge status: Home Health | Payer: Medicare Other | Attending: Internal Medicine | Admitting: Internal Medicine

## 2011-03-30 ENCOUNTER — Emergency Department (HOSPITAL_COMMUNITY): Payer: Medicare Other

## 2011-03-30 ENCOUNTER — Telehealth: Payer: Self-pay | Admitting: Pulmonary Disease

## 2011-03-30 DIAGNOSIS — Y849 Medical procedure, unspecified as the cause of abnormal reaction of the patient, or of later complication, without mention of misadventure at the time of the procedure: Secondary | ICD-10-CM | POA: Diagnosis present

## 2011-03-30 DIAGNOSIS — J95851 Ventilator associated pneumonia: Secondary | ICD-10-CM | POA: Diagnosis present

## 2011-03-30 DIAGNOSIS — Z66 Do not resuscitate: Secondary | ICD-10-CM | POA: Diagnosis present

## 2011-03-30 DIAGNOSIS — J9620 Acute and chronic respiratory failure, unspecified whether with hypoxia or hypercapnia: Secondary | ICD-10-CM

## 2011-03-30 DIAGNOSIS — N3000 Acute cystitis without hematuria: Secondary | ICD-10-CM

## 2011-03-30 DIAGNOSIS — K810 Acute cholecystitis: Secondary | ICD-10-CM | POA: Diagnosis present

## 2011-03-30 DIAGNOSIS — D72829 Elevated white blood cell count, unspecified: Secondary | ICD-10-CM | POA: Diagnosis present

## 2011-03-30 DIAGNOSIS — Z931 Gastrostomy status: Secondary | ICD-10-CM

## 2011-03-30 DIAGNOSIS — E46 Unspecified protein-calorie malnutrition: Secondary | ICD-10-CM | POA: Diagnosis present

## 2011-03-30 DIAGNOSIS — G1221 Amyotrophic lateral sclerosis: Secondary | ICD-10-CM | POA: Diagnosis present

## 2011-03-30 DIAGNOSIS — A419 Sepsis, unspecified organism: Secondary | ICD-10-CM | POA: Diagnosis present

## 2011-03-30 DIAGNOSIS — G6281 Critical illness polyneuropathy: Secondary | ICD-10-CM | POA: Diagnosis present

## 2011-03-30 DIAGNOSIS — E876 Hypokalemia: Secondary | ICD-10-CM | POA: Diagnosis present

## 2011-03-30 DIAGNOSIS — Z930 Tracheostomy status: Secondary | ICD-10-CM

## 2011-03-30 DIAGNOSIS — N390 Urinary tract infection, site not specified: Secondary | ICD-10-CM | POA: Diagnosis present

## 2011-03-30 DIAGNOSIS — K769 Liver disease, unspecified: Secondary | ICD-10-CM | POA: Diagnosis present

## 2011-03-30 DIAGNOSIS — I498 Other specified cardiac arrhythmias: Secondary | ICD-10-CM | POA: Diagnosis present

## 2011-03-30 DIAGNOSIS — J9610 Chronic respiratory failure, unspecified whether with hypoxia or hypercapnia: Secondary | ICD-10-CM | POA: Diagnosis present

## 2011-03-30 DIAGNOSIS — E861 Hypovolemia: Secondary | ICD-10-CM | POA: Diagnosis present

## 2011-03-30 DIAGNOSIS — Z6827 Body mass index (BMI) 27.0-27.9, adult: Secondary | ICD-10-CM

## 2011-03-30 DIAGNOSIS — B965 Pseudomonas (aeruginosa) (mallei) (pseudomallei) as the cause of diseases classified elsewhere: Secondary | ICD-10-CM | POA: Diagnosis present

## 2011-03-30 DIAGNOSIS — D539 Nutritional anemia, unspecified: Secondary | ICD-10-CM | POA: Diagnosis present

## 2011-03-30 DIAGNOSIS — J13 Pneumonia due to Streptococcus pneumoniae: Secondary | ICD-10-CM

## 2011-03-30 DIAGNOSIS — R17 Unspecified jaundice: Secondary | ICD-10-CM | POA: Diagnosis present

## 2011-03-30 DIAGNOSIS — Z9981 Dependence on supplemental oxygen: Secondary | ICD-10-CM

## 2011-03-30 LAB — DIFFERENTIAL
Basophils Relative: 0 % (ref 0–1)
Eosinophils Relative: 0 % (ref 0–5)
Lymphs Abs: 1.3 10*3/uL (ref 0.7–4.0)
Monocytes Absolute: 1.8 10*3/uL — ABNORMAL HIGH (ref 0.1–1.0)
Monocytes Relative: 7 % (ref 3–12)
Neutro Abs: 22.3 10*3/uL — ABNORMAL HIGH (ref 1.7–7.7)

## 2011-03-30 LAB — BASIC METABOLIC PANEL
CO2: 26 mEq/L (ref 19–32)
Glucose, Bld: 213 mg/dL — ABNORMAL HIGH (ref 70–99)
Potassium: 3.1 mEq/L — ABNORMAL LOW (ref 3.5–5.1)
Sodium: 133 mEq/L — ABNORMAL LOW (ref 135–145)

## 2011-03-30 LAB — COMPREHENSIVE METABOLIC PANEL
ALT: 99 U/L — ABNORMAL HIGH (ref 0–53)
BUN: 9 mg/dL (ref 6–23)
CO2: 25 mEq/L (ref 19–32)
Calcium: 9.6 mg/dL (ref 8.4–10.5)
Creatinine, Ser: <0.47 mg/dL — ABNORMAL LOW (ref 0.50–1.35)
Glucose, Bld: 195 mg/dL — ABNORMAL HIGH (ref 70–99)
Sodium: 133 mEq/L — ABNORMAL LOW (ref 135–145)
Total Protein: 8.2 g/dL (ref 6.0–8.3)

## 2011-03-30 LAB — URINALYSIS, ROUTINE W REFLEX MICROSCOPIC
Ketones, ur: NEGATIVE mg/dL
Ketones, ur: NEGATIVE mg/dL
Leukocytes, UA: NEGATIVE
Nitrite: POSITIVE — AB
Protein, ur: 30 mg/dL — AB
Urobilinogen, UA: 1 mg/dL (ref 0.0–1.0)
Urobilinogen, UA: 1 mg/dL (ref 0.0–1.0)
pH: 8 (ref 5.0–8.0)

## 2011-03-30 LAB — CBC
HCT: 35.6 % — ABNORMAL LOW (ref 39.0–52.0)
Hemoglobin: 12.3 g/dL — ABNORMAL LOW (ref 13.0–17.0)
MCH: 26.4 pg (ref 26.0–34.0)
MCHC: 34.6 g/dL (ref 30.0–36.0)
MCV: 76.4 fL — ABNORMAL LOW (ref 78.0–100.0)
RBC: 4.66 MIL/uL (ref 4.22–5.81)

## 2011-03-30 LAB — PRO B NATRIURETIC PEPTIDE: Pro B Natriuretic peptide (BNP): 98 pg/mL (ref 0–125)

## 2011-03-30 LAB — MAGNESIUM: Magnesium: 2.4 mg/dL (ref 1.5–2.5)

## 2011-03-30 LAB — URINE MICROSCOPIC-ADD ON

## 2011-03-30 LAB — MRSA PCR SCREENING: MRSA by PCR: NEGATIVE

## 2011-03-30 LAB — LACTIC ACID, PLASMA: Lactic Acid, Venous: 0.8 mmol/L (ref 0.5–2.2)

## 2011-03-30 LAB — PROTIME-INR
INR: 1.35 (ref 0.00–1.49)
Prothrombin Time: 16.9 seconds — ABNORMAL HIGH (ref 11.6–15.2)

## 2011-03-30 LAB — PHOSPHORUS: Phosphorus: 1.6 mg/dL — ABNORMAL LOW (ref 2.3–4.6)

## 2011-03-30 LAB — STREP PNEUMONIAE URINARY ANTIGEN: Strep Pneumo Urinary Antigen: NEGATIVE

## 2011-03-30 NOTE — Telephone Encounter (Signed)
I think he needs to go to ED, or comfort care if his wife has gotten comfortable with this idea.  The pt is slowly dying, and this will be an ongoing process.

## 2011-03-30 NOTE — Telephone Encounter (Signed)
Spoke with [name] and made aware, she states is taking pt to ED today and will revisit comfort care later.

## 2011-03-30 NOTE — Telephone Encounter (Signed)
Spoke with Louellen Molder, pt's nurse. She states that today is the last dose of augmentin, and his secretions are still yellow and frothy. Also still has temp- now at 101.2 and his skin and eyes are jaundice, urine very dark in color. She states that she is leaning towards taking him to the ED today and wanted to let [facility] know.

## 2011-03-31 ENCOUNTER — Inpatient Hospital Stay (HOSPITAL_COMMUNITY): Payer: Medicare Other

## 2011-03-31 DIAGNOSIS — J9620 Acute and chronic respiratory failure, unspecified whether with hypoxia or hypercapnia: Secondary | ICD-10-CM

## 2011-03-31 DIAGNOSIS — N3000 Acute cystitis without hematuria: Secondary | ICD-10-CM

## 2011-03-31 DIAGNOSIS — G1221 Amyotrophic lateral sclerosis: Secondary | ICD-10-CM

## 2011-03-31 DIAGNOSIS — K819 Cholecystitis, unspecified: Secondary | ICD-10-CM

## 2011-03-31 LAB — COMPREHENSIVE METABOLIC PANEL
ALT: 81 U/L — ABNORMAL HIGH (ref 0–53)
AST: 65 U/L — ABNORMAL HIGH (ref 0–37)
Albumin: 2.3 g/dL — ABNORMAL LOW (ref 3.5–5.2)
CO2: 27 mEq/L (ref 19–32)
Calcium: 8.5 mg/dL (ref 8.4–10.5)
Creatinine, Ser: <0.47 mg/dL — ABNORMAL LOW (ref 0.50–1.35)
Sodium: 135 mEq/L (ref 135–145)

## 2011-03-31 LAB — CBC
HCT: 32.1 % — ABNORMAL LOW (ref 39.0–52.0)
Hemoglobin: 10.6 g/dL — ABNORMAL LOW (ref 13.0–17.0)
MCH: 25.5 pg — ABNORMAL LOW (ref 26.0–34.0)
MCHC: 33 g/dL (ref 30.0–36.0)
MCV: 77.3 fL — ABNORMAL LOW (ref 78.0–100.0)
RBC: 4.15 MIL/uL — ABNORMAL LOW (ref 4.22–5.81)

## 2011-03-31 LAB — CARDIAC PANEL(CRET KIN+CKTOT+MB+TROPI)
CK, MB: 1.6 ng/mL (ref 0.3–4.0)
Relative Index: INVALID (ref 0.0–2.5)
Total CK: 10 U/L (ref 7–232)

## 2011-03-31 LAB — PHOSPHORUS: Phosphorus: 1.8 mg/dL — ABNORMAL LOW (ref 2.3–4.6)

## 2011-03-31 LAB — GLUCOSE, CAPILLARY
Glucose-Capillary: 192 mg/dL — ABNORMAL HIGH (ref 70–99)
Glucose-Capillary: 182 mg/dL — ABNORMAL HIGH (ref 70–99)
Glucose-Capillary: 277 mg/dL — ABNORMAL HIGH (ref 70–99)

## 2011-03-31 LAB — LEGIONELLA ANTIGEN, URINE: Legionella Antigen, Urine: NEGATIVE

## 2011-03-31 LAB — URINE CULTURE
Colony Count: NO GROWTH
Culture  Setup Time: 201209250222

## 2011-04-01 DIAGNOSIS — R932 Abnormal findings on diagnostic imaging of liver and biliary tract: Secondary | ICD-10-CM

## 2011-04-01 DIAGNOSIS — R17 Unspecified jaundice: Secondary | ICD-10-CM

## 2011-04-01 DIAGNOSIS — R7402 Elevation of levels of lactic acid dehydrogenase (LDH): Secondary | ICD-10-CM

## 2011-04-01 DIAGNOSIS — R740 Nonspecific elevation of levels of transaminase and lactic acid dehydrogenase [LDH]: Secondary | ICD-10-CM

## 2011-04-01 LAB — BASIC METABOLIC PANEL
BUN: 10 mg/dL (ref 6–23)
Chloride: 97 mEq/L (ref 96–112)
Glucose, Bld: 171 mg/dL — ABNORMAL HIGH (ref 70–99)
Potassium: 3.6 mEq/L (ref 3.5–5.1)
Sodium: 137 mEq/L (ref 135–145)

## 2011-04-01 LAB — GLUCOSE, CAPILLARY
Glucose-Capillary: 153 mg/dL — ABNORMAL HIGH (ref 70–99)
Glucose-Capillary: 177 mg/dL — ABNORMAL HIGH (ref 70–99)
Glucose-Capillary: 233 mg/dL — ABNORMAL HIGH (ref 70–99)

## 2011-04-02 ENCOUNTER — Inpatient Hospital Stay (HOSPITAL_COMMUNITY): Payer: Medicare Other

## 2011-04-02 ENCOUNTER — Other Ambulatory Visit: Payer: Self-pay | Admitting: *Deleted

## 2011-04-02 DIAGNOSIS — N3000 Acute cystitis without hematuria: Secondary | ICD-10-CM

## 2011-04-02 DIAGNOSIS — R740 Nonspecific elevation of levels of transaminase and lactic acid dehydrogenase [LDH]: Secondary | ICD-10-CM

## 2011-04-02 DIAGNOSIS — K819 Cholecystitis, unspecified: Secondary | ICD-10-CM

## 2011-04-02 DIAGNOSIS — J9620 Acute and chronic respiratory failure, unspecified whether with hypoxia or hypercapnia: Secondary | ICD-10-CM

## 2011-04-02 DIAGNOSIS — R932 Abnormal findings on diagnostic imaging of liver and biliary tract: Secondary | ICD-10-CM

## 2011-04-02 DIAGNOSIS — R17 Unspecified jaundice: Secondary | ICD-10-CM

## 2011-04-02 DIAGNOSIS — G1221 Amyotrophic lateral sclerosis: Secondary | ICD-10-CM

## 2011-04-02 LAB — HEPATIC FUNCTION PANEL
AST: 56 U/L — ABNORMAL HIGH (ref 0–37)
Albumin: 2.5 g/dL — ABNORMAL LOW (ref 3.5–5.2)

## 2011-04-02 LAB — IRON AND TIBC
Iron: 36 ug/dL — ABNORMAL LOW (ref 42–135)
Saturation Ratios: 16 % — ABNORMAL LOW (ref 20–55)
UIBC: 189 ug/dL (ref 125–400)

## 2011-04-02 LAB — BASIC METABOLIC PANEL
CO2: 28 mEq/L (ref 19–32)
Glucose, Bld: 143 mg/dL — ABNORMAL HIGH (ref 70–99)
Potassium: 3.2 mEq/L — ABNORMAL LOW (ref 3.5–5.1)
Sodium: 138 mEq/L (ref 135–145)

## 2011-04-02 LAB — GLUCOSE, CAPILLARY
Glucose-Capillary: 146 mg/dL — ABNORMAL HIGH (ref 70–99)
Glucose-Capillary: 182 mg/dL — ABNORMAL HIGH (ref 70–99)

## 2011-04-02 LAB — CBC
HCT: 31 % — ABNORMAL LOW (ref 39.0–52.0)
Hemoglobin: 10.3 g/dL — ABNORMAL LOW (ref 13.0–17.0)
RBC: 4.04 MIL/uL — ABNORMAL LOW (ref 4.22–5.81)
WBC: 12.6 10*3/uL — ABNORMAL HIGH (ref 4.0–10.5)

## 2011-04-02 LAB — CULTURE, RESPIRATORY W GRAM STAIN

## 2011-04-02 LAB — URINE CULTURE

## 2011-04-02 MED ORDER — LORAZEPAM 0.5 MG PO TABS: 0.5000 mg | ORAL_TABLET | Freq: Two times a day (BID) | ORAL | Status: DC | PRN

## 2011-04-02 NOTE — Discharge Summary (Addendum)
NAMEJENS, [name] NO.:  [id number]  MEDICAL RECORD NO.:  [id number]  LOCATION:  2107                         FACILITY:  [facility]  PHYSICIAN:  Oley Balm. Sung Amabile, MD   DATE OF BIRTH:  [birth year]  DATE OF ADMISSION:  03/30/2011 DATE OF DISCHARGE:                              DISCHARGE SUMMARY   DISCHARGE DIAGNOSES: 1. Sepsis secondary to acute cholecystitis. 2. Pneumonia - ventilator associated/chronic respiratory failure. 3. Urinary tract infection. 4. Amyotrophic lateral sclerosis. 5. Hepatic dysfunction/protein-calorie malnutrition. 6. Hypokalemia.  LINES/TUBES: 1. Chronic #6 cuffed tracheostomy. 2. Chronic PEG.  CULTURE DATA:  March 30, 2011, urine Legionella is negative. March 30, 2011, urine culture demonstrates no growth.  March 30, 2011, blood cultures x2 demonstrate no growth on preliminary results, final results pending.  March 30, 2011, urine culture demonstrates greater than 100,000 colonies Pseudomonas aeruginosa, intermediate to cefazolin, ceftazidime; resistant to ciprofloxacin, gentamicin, and Zosyn and intermediate to imipenem.  Respiratory culture on March 30, 2011, demonstrates Pseudomonas which is sensitive to imipenem.  LABORATORY DATA:  April 02, 2011, CBC demonstrates CBC 12.6, hemoglobin 10.3, hematocrit 31.0, platelet count 371.  Hepatic function panel demonstrates total bilirubin 0.8, direct bilirubin 0.4, indirect 0.4, alkaline phosphatase 182, AST 56, ALT 76, total protein 7.1, albumin 2.5.  Serum amylase 45.  BMP demonstrates sodium 138, potassium 3.2, chloride 102, CO2 of 28, glucose 143, BUN 17, creatinine 0.47, calcium 8.7.  Lipase is 58.  Anemia panel demonstrates iron 36, total iron binding capacity 225, percent saturation 16, and UIBC of 189.  ANTIBIOTIC DATA:  The patient was placed on vancomycin on March 30, 2011, through April 01, 2011, and Primaxin from March 30, 2011, and will  continue on Primaxin for 7 more days.  BEST PRACTICE:  The patient was placed on Protonix for stress ulcer prophylaxis and heparin for DVT prevention.  He was continued on home tube feeding for nutritional support.  PROTOCOL/CONSULTANTS:  [facility].  KEY EVENTS/STUDIES:  March 30, 2011, abdominal ultrasound demonstrates dependent gallstones or sludge with mild gallbladder wall thickening and trace pericolic fluid consistent with findings for acute cholecystitis.  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  The emergency department evaluation demonstrated leukocytosis, urinary tract infection, and a chest x-ray with bibasilar airspace disease.  He was admitted to the intensive care unit and maintained on home ventilatory support settings.  His chronic trach is a #6 cuffed.  Further workup demonstrated a lower urinary tract infection with Pseudomonas.  Respiratory culture was also positive for Pseudomonas.  Please see antibiotic data as above.  [name] was thought to have severe sepsis secondary to acute cholecystitis, more specifically obstructive in nature.  [facility] was consulted for evaluation.  At this time, it was felt he was not a surgical candidate for cholecystectomy and it was felt that he did not need percutaneous drainage and since LFTs spontaneously improved.  GI recommended conservative management and to observe clinically.  Also noted during hospitalization  was transient hypokalemia which was monitored, serially repleted as needed.  HOSPITAL COURSE BY DISCHARGE DIAGNOSIS: 1. Sepsis secondary to acute cholecystitis.  As per HPI, [name]     presented to the [facility] Emergency Department on March 30, 2011, with fevers, increased sputum production, and lethargy.     Workup at that time demonstrated leukocytosis, urinary tract     infection, and chest x-ray consistent with basilar airspace     disease.  Also noted was elevated LFTs and bilirubin.  Abdominal     ultrasound was obtained to further investigate and demonstrated     gallstones with sludge and possible cholecystitis.  He was placed     on vancomycin and Primaxin on admission and antibiotics were     narrowed as appropriate.  Urine and respiratory cultures     demonstrated Pseudomonas which has sensitivities as above.  [name] LFTs and bilirubin spontaneously resolved with     antibiotics.  GI was consulted and felt conservative medical     management at this time was appropriate.  If [name] has     repeated episode of cholecystitis, possibly consider surgical     intervention at that time.  It was felt that [name] likely had     an obstructive pattern and spontaneously passed a stone. 2. Pneumonia, ventilator associated/chronic respiratory failure.  [name] has a known history of end-stage ALS on chronic ventilatory     support.  Chest x-ray on admission demonstrated bibasilar airspace     disease which was likely consistent with dependent atelectasis.  He     was pancultured and sputum demonstrated Pseudomonas.  He was placed     on Primaxin and vancomycin on admit and vancomycin was discontinued     and he will continue on Primaxin for 7 more days postdischarge. 3. Urinary tract infection.  [name] also had Pseudomonas in urine     culture.  However, on urinalysis, only had 0-2 wbc's.     Sensitivities indicates intermediate resistance to Primaxin on     urine, however, he will continue on Primaxin for 7 more days in the     setting of sensitive sputum Pseudomonas. 4. ALS.  This is a chronic and historical diagnosis for [name].     No acute changes for this diagnosis made during  hospital admission. 5. Hepatic dysfunction/protein-calorie malnutrition.  As per HPI, [name] was noted to have elevated LFTs and total bilirubin.  He     also was mildly jaundiced on admit and it was felt that he had an     obstructive gallstone contributing to sepsis with spontaneous     passing and resolution of elevated LFTs noted during hospital     admission.  [facility] was consulted for input regarding     questionable surgical options and felt at this time that     conservative management was best approach as he did have     spontaneous resolution of elevated LFTs.  If [name] were to     have reoccurrence of cholecystitis, could consider surgical     evaluation at that time.  However, at this time, GI favors     conservative approach. 6. Hypokalemia.  This was transient during hospital admission and     monitored serially and repleted as needed.  He will have a followup  BMP and LFTs in 1 week.  DISCHARGE INSTRUCTIONS: 1. Activity:  Bed rest and activity as tolerated. 2. Diet:  Pulmocare tube feedings. 3. Followup:  Scheduled followup with Dr. Shelle Iron on Friday, April 17, 2011, at 3:30 p.m.  He also will have scheduled laboratory data     in 1 week to include a BMP and LFTs.  DISCHARGE MEDICATIONS: 1. Primaxin 500 mg every 6 hours for 7 more days. 2. Albuterol 2.5 mg every 4 hours as needed for shortness of breath. 3. Bacitracin 1 application topically daily to G-tube. 4. Benadryl 10 mL per tube every 4 hours as needed. 5. Baclofen 10 mg per tube 3 times a day. 6. Debrox 2-3 drops in both ears daily as needed for earwax removal. 7. Cetirizine 5 mL by mouth twice daily. 8. Clonazepam 0.5 mg 1 tablet per tube twice daily. 9. Vitamin B12, 1000 mcg/1 mL injection twice weekly. 10.Desloratadine oral syrup 10 mL per tube daily. 11.Delsym 10 mL per tube every 12 hours. 12.Fish oil 5 mL per tube 3 times a day with meals. 13.Gaviscon 2-4 teaspoons per tube 4  times daily as needed. 14.Atrovent 0.5 mg inhaled 4 times daily. 15.Ketorolac ophthalmic 0.45% 1 drop in both eyes twice daily. 16.Lansoprazole 5 mL per tube twice daily. 17.Lorazepam 0.5 mg per tube every 4 hours as needed. 18.Lortab 5/500 one tablet per tube every 4 hours as needed. 19.MiraLax 17 g per tube daily. 20.Morphine IR 15 mg by mouth every 6 hours as needed. 21.Multivitamin 2 tablespoons per tube daily. 22.Tylenol suspension 5 mL by mouth 4 times daily as needed. 23.Pulmocare tube feedings 80-240 mL per tube 3 times daily. 24.Systane eyedrops in both eyes every 2 hours. 25.Tylenol 500 mg tablets 2 tablets by mouth every 4 hours as needed. 26.Xylocaine 2% cream 1 application topically daily as needed.  [name] has been instructed to stop taking the following medications: 1. Cipro. 2. Augmentin.  DISPOSITION:  At the time of discharge summary, [name] has met maximum benefit of inpatient therapy and is currently medically stable and cleared for discharge.  PENDING FOLLOWUP:  As above with Dr. Shelle Iron.     Canary Brim, NP   ______________________________ Oley Balm Sung Amabile, MD   BO/MEDQ  D:  04/02/2011  T:  04/02/2011  Job:  [id number]  cc:   Barbaraann Share, MD,FCCP Dr. Arlyce Dice  Electronically Signed by Canary Brim  on 04/02/2011 04:54:45 PM Electronically Signed by Billy Fischer MD on 04/15/2011 10:24:42 AM

## 2011-04-02 NOTE — Telephone Encounter (Signed)
Received refill request from [facility].  KC signed and  ok'd refill request x 5 refills.  Signed rx faxed back to pharmacy.

## 2011-04-03 DIAGNOSIS — A0472 Enterocolitis due to Clostridium difficile, not specified as recurrent: Secondary | ICD-10-CM

## 2011-04-03 LAB — GLUCOSE, CAPILLARY
Glucose-Capillary: 127 mg/dL — ABNORMAL HIGH (ref 70–99)
Glucose-Capillary: 149 mg/dL — ABNORMAL HIGH (ref 70–99)
Glucose-Capillary: 164 mg/dL — ABNORMAL HIGH (ref 70–99)
Glucose-Capillary: 168 mg/dL — ABNORMAL HIGH (ref 70–99)
Glucose-Capillary: 185 mg/dL — ABNORMAL HIGH (ref 70–99)
Glucose-Capillary: 190 mg/dL — ABNORMAL HIGH (ref 70–99)
Glucose-Capillary: 191 mg/dL — ABNORMAL HIGH (ref 70–99)

## 2011-04-04 LAB — GLUCOSE, CAPILLARY: Glucose-Capillary: 186 mg/dL — ABNORMAL HIGH (ref 70–99)

## 2011-04-06 LAB — CULTURE, BLOOD (ROUTINE X 2): Culture  Setup Time: 201209250035

## 2011-04-06 LAB — GLUCOSE, CAPILLARY: Glucose-Capillary: 149 mg/dL — ABNORMAL HIGH (ref 70–99)

## 2011-04-08 ENCOUNTER — Telehealth: Payer: Self-pay | Admitting: Pulmonary Disease

## 2011-04-08 NOTE — Telephone Encounter (Signed)
Wrote note and put in triage Not sure what the HIPPA requirements are for this.  Let the nurse know that it would be best if there is a form in the future for the insurance company that includes a release of info.

## 2011-04-08 NOTE — Telephone Encounter (Signed)
Order was given to [facility] to sign and was faxed back to the number provided.

## 2011-04-08 NOTE — Telephone Encounter (Signed)
Note was faxed to both places requested. Called and spoke with Zella Ball and notified that this was done and notified of KC's recs. She verbalized understanding.

## 2011-04-08 NOTE — Telephone Encounter (Signed)
Spoke with [facility]. She states that she is needing order for cxr sent to [facility]. She states that he was sent home from the hospital with orders to have labs and cxr done today and nothing but lab orders were sent. Looks like last had cxr on 04/02/11. KC, pls advise thanks!

## 2011-04-08 NOTE — Telephone Encounter (Signed)
Spoke with pt's spouse. She states needs letter sent to ins co [employer] stating pt's dx, recent hospitalization dates, and need for 24/7 nursing care. She states that the letter will need to be faxed to Maurie Boettcher at [phone] and also to the nursing agency at [phone]. Please advise thanks

## 2011-04-08 NOTE — Telephone Encounter (Signed)
Amber with [facility] returning call can be reached at [phone].Raylene Everts

## 2011-04-08 NOTE — Telephone Encounter (Signed)
[email or web address]  

## 2011-04-08 NOTE — Telephone Encounter (Signed)
Ok with me 

## 2011-04-10 ENCOUNTER — Telehealth: Payer: Self-pay | Admitting: Pulmonary Disease

## 2011-04-10 ENCOUNTER — Telehealth: Payer: Self-pay | Admitting: Gastroenterology

## 2011-04-10 ENCOUNTER — Other Ambulatory Visit: Payer: Self-pay | Admitting: Pulmonary Disease

## 2011-04-10 DIAGNOSIS — K810 Acute cholecystitis: Secondary | ICD-10-CM | POA: Insufficient documentation

## 2011-04-10 NOTE — Telephone Encounter (Signed)
Spoke with Revonda Standard and it appears Dr. Sung Amabile d/c'ed the patient from hospital 6 days ago. She will contact him for f/u care orders. She wants Dr. Arlyce Dice to know patient is doing okay. Patient will be getting a f/u ultrasound of abdomen and he will be finishing Flagyl tomorrow(c.diff)

## 2011-04-10 NOTE — Telephone Encounter (Signed)
1) his u/a was negative in hospital even when he grew bacteria from urine.  This usually indicates colonization, not infection.  Would not do another u/a unless something significantly changes. 2) I spoke with Dr. Sung Amabile, and will order ultrasound.  If still abnormal, will need to see GI since this is outside my field of expertise. 3) would not do anything different from a pulmonary standpoint.  Really not a lot to do.  Just finished abx.

## 2011-04-10 NOTE — Telephone Encounter (Signed)
Called, spoke with Revonda Standard, pt's nurse.  States pt was discharged from hospital on 04/04/11.  She is requesting: 1.  Order for f/u UA 2.  Stats Dr. Sung Amabile had stated he wanted pt to have a f/u US of right upper quadrant for his gallbladder.  However, he forgot to write an order for this so the ICU nurse did get an order and faxed it to [facility].  [facility] is telling them that they have not received this order and do not have it.  Revonda Standard would like for [facility] to write an order for this.  It will need to be faxed to both [facility] and [facility].  [facility] fax # - [phone].    Also, Revonda Standard would like to give [facility] an update on pt's condition.  States he is doing ok.  He is "rattly" in his chest, o2 sats are staying around 98% on 2L - pt on trach.  He is not having many BM's and is almost done with abxs.    Dr. Shelle Iron, pls advise.  Thanks!

## 2011-04-13 ENCOUNTER — Telehealth: Payer: Self-pay | Admitting: Pulmonary Disease

## 2011-04-13 ENCOUNTER — Other Ambulatory Visit: Payer: Self-pay | Admitting: Pulmonary Disease

## 2011-04-13 MED ORDER — CIPROFLOXACIN HCL 750 MG PO TABS: ORAL_TABLET | ORAL | Status: AC

## 2011-04-13 NOTE — Telephone Encounter (Signed)
Discussed with wife.  Will hold off on labs.   See other note for abx.

## 2011-04-13 NOTE — Telephone Encounter (Signed)
I spoke with Kenney Houseman and she states the pt's family thinks pt needs to have labs done. She states they are concerned b/c he is not better yet and is having some congestion. Kenney Houseman is on her way now to change pt's picc line dressing. Please advise Dr. Shelle Iron if you feel pt's needs to have labs done. Thanks  Carver Fila, CMA

## 2011-04-13 NOTE — Telephone Encounter (Signed)
Ok to call in cipro 750mg  one per tube am and pm for next 7 days. No fills.

## 2011-04-13 NOTE — Telephone Encounter (Signed)
He can stop it now.

## 2011-04-13 NOTE — Telephone Encounter (Signed)
I spoke with pt nurse and advised her of KC'S recs. She verbalized understanding. The nurse now states that pt finished up his antibiotics on Friday. She states he is still having thick yellow secretions, had a fever of 99.1 but is given pt tramadol. The nurse thinks pt needs to be on abx longer to help him clear up. Please advise Dr. Shelle Iron. Thanks  Allergies  Allergen Reactions  . Amitriptyline Hcl     Elavil  . Cefdinir     omnicef  . Cephalosporins   . Dicyclomine Hcl     Bentyl  . Glutathione   . Jevity   . Latex   . Levofloxacin     Levaquin  . Sulfamethoxazole W/Trimethoprim     Septra    Carver Fila, CMA

## 2011-04-13 NOTE — Telephone Encounter (Signed)
See previous phone note.  

## 2011-04-13 NOTE — Telephone Encounter (Signed)
I called and spoke with pt's nurse, Vicky.  I informed her KC is ok to call in cipro 750 mg - pt to take one per tube in the am and pm for the next 7 days.  [facility].  Vicky verbalized understanding of this.  She has another question for [facility] - pt was put on cipro eye drops bid by the PCCM, MD.  He has been on this x 9 days.  She is requesting a stop date for this bc of it being an abx.  KC, pls advise.  Thanks!

## 2011-04-14 ENCOUNTER — Other Ambulatory Visit (HOSPITAL_COMMUNITY): Payer: Medicare Other

## 2011-04-14 ENCOUNTER — Telehealth: Payer: Self-pay | Admitting: Gastroenterology

## 2011-04-14 NOTE — Telephone Encounter (Signed)
I need to see the results of the report ultrasound.  Also, he need another set of LFts

## 2011-04-14 NOTE — Telephone Encounter (Signed)
Spoke with [name] and notified per [facility] okay to d/c the eye drops. She verbalized understanding and states nothing further needed.

## 2011-04-14 NOTE — Telephone Encounter (Signed)
Pts wife states he was recently in the hospital, admitted by pulmonary. Pt had dark urine, fever and abdominal pain. Ultrasound shows gallstones. Wife states he just finished treatment for cdiff and has now been placed on cipro for chest congestion. Concerned because of the antibiotic and cdiff. Wife states pt just had another ultrasound done at home today and it shows gallstones also. Wife states her husband is letting them know he is having some abdominal pain with this.   Wife wants to know what Dr. Arlyce Dice recommends be done for the gallstones. Please advise.

## 2011-04-15 NOTE — Discharge Summary (Signed)
  [name], KERNER NO.:  [id number]  MEDICAL RECORD NO.:  [id number]  LOCATION:  2107                         FACILITY:  [facility]  PHYSICIAN:  Oley Balm. Sung Amabile, MD   DATE OF BIRTH:  04-14-1956  DATE OF ADMISSION:  03/30/2011 DATE OF DISCHARGE:  04/04/2011                              DISCHARGE SUMMARY   ADDENDUM  Previously dictated by Canary Brim, nurse practitioner.  After previous discharge summary was dictated, the patient had episode of desaturation and severe anxiety as well as new onset of diarrhea. The patient was checked for C. diff and on September 28, C. diff PCR was positive.  The patient was started on p.o. Flagyl.  The patient also received increased doses of Ativan and Lortab as well as nebulized bronchodilators for episode of severe anxiety and desaturation, possibly brought on by abdominal pain and diarrhea secondary to C. diff.  ADDITIONAL DISCHARGE DIAGNOSES:  Clostridium difficile colitis.  UPDATED DISCHARGE MEDICATION LIST: 1. Primaxin 500 mg IV q.6 h x5 days. 2. Flagyl 500 mg via tube q.8 h x7 days. 3. Nystatin topical powder 1 application topically q.i.d. p.r.n. 4. Ativan 0.5 mg tablets, 1-2 tablets via tube q.4 h p.r.n. 5. Lortab 5/500 one-two tablets via tube q.4 h p.r.n.  The remaining discharge medication list is the same as previously dictated discharge summary.  DISCHARGE FOLLOWUP:  The patient is to follow up with Dr. Marcelyn Bruins in the pulmonary office as previously stated on October 12 at 3:30 p.m. The patient will also have a chest x-ray as well as laboratory workup in 1 week per home health.  Please see previous discharge summary for further details.    Dirk Dress, NP   ______________________________ Oley Balm Sung Amabile, MD   KW/MEDQ  D:  04/04/2011  T:  04/04/2011  Job:  [id number]  cc:   Barbaraann Share, MD,FCCP  Electronically Signed by Danford Bad N.P. on 04/08/2011 11:53:05 AM Electronically  Signed by Billy Fischer MD on 04/15/2011 10:24:47 AM

## 2011-04-15 NOTE — Telephone Encounter (Signed)
Ultrasound report given to Dr. Arlyce Dice and LFT's ordered with [facility], orders faxed.

## 2011-04-15 NOTE — Telephone Encounter (Signed)
Per Dr. Arlyce Dice pt scheduled to see Dr. Arlyce Dice 04/16/11@10 :30am. Pts wife aware of appt date and time.

## 2011-04-16 ENCOUNTER — Encounter: Payer: Self-pay | Admitting: Gastroenterology

## 2011-04-16 ENCOUNTER — Ambulatory Visit (INDEPENDENT_AMBULATORY_CARE_PROVIDER_SITE_OTHER): Payer: Medicare Other | Admitting: Gastroenterology

## 2011-04-16 DIAGNOSIS — K810 Acute cholecystitis: Secondary | ICD-10-CM

## 2011-04-16 DIAGNOSIS — A0472 Enterocolitis due to Clostridium difficile, not specified as recurrent: Secondary | ICD-10-CM | POA: Insufficient documentation

## 2011-04-16 NOTE — Assessment & Plan Note (Addendum)
Recent history of pseudomembranous colitis  The patient was placed back on Cipro because of respiratory problems. He will continue on florastor

## 2011-04-16 NOTE — Assessment & Plan Note (Signed)
The patient is having recurrent abdominal pain which could be attributed to cholecystitis. His recent sepsis and transient abnormal liver tests raises my concern for a bile duct stone and cholangitis. Currently his liver tests are normal and his common bile duct is within normal limits.  Recommendations #1 placement of a percutaneous cholecystostomy tube #2 if the patient develops recurrent pain, sepsis or jaundice subsequent to placement of the cholecystostomy tube I would consider ERCP with sphincterotomy

## 2011-04-16 NOTE — Progress Notes (Signed)
[name] is a 55 year old white male with ALS, ventilator dependent, referred for evaluation of abdominal pain. He was recently hospitalized with abdominal pain and sepsis. He had transient elevation of his alkaline phosphatase to the 180 range which has subsequently returned to normal. His nurse noted that his urine was dark colored for a short period of time. Abdominal ultrasound x2 demonstrates multiple gallbladder stones. Common bile duct measures 5 mm. He continues to have intermittent right upper quadrant pain.  The patient also had a recent history of pseudomembranous colitis.  He was recently placed on Cipro because of thickened secretions.      Past Medical History  Diagnosis Date  . ALS (amyotrophic lateral sclerosis)     vent dependent   Past Surgical History  Procedure Date  . Pic line     reports that he has never smoked. He has never used smokeless tobacco. He reports that he does not drink alcohol or use illicit drugs. family history is not on file.  Current medications and social history were reviewed in [employer] [facility] electronic medical record  Review of Systems: Pertinent positive and negative review of systems were noted in the above HPI section. All other review of systems were otherwise negative.  Vital signs were reviewed in today's medical record Physical Exam: General: Well developed , well nourished, no acute distress Head: Normocephalic and atraumatic Eyes:  sclerae anicteric, EOMI Ears: Normal auditory acuity Mouth: No deformity or lesions Neck: Supple, no masses or thyromegaly Lungs: There are a few rhonchi at the lungs posteriorly bilaterally Heart: Regular rate and rhythm; no murmurs, rubs or bruits Abdomen: Soft, non tender and non distended. No masses, hepatosplenomegaly or hernias noted. Normal Bowel sounds Rectal:deferred Musculoskeletal: Symmetrical with no gross deformities  Skin: No lesions on visible extremities Pulses:  Normal pulses  noted Extremities: No clubbing, cyanosis, edema or deformities noted Neurological: Alert oriented x 4, grossly nonfocal Cervical Nodes:  No significant cervical adenopathy Inguinal Nodes: No significant inguinal adenopathy Psychological:  Alert and cooperative. Normal mood and affect

## 2011-04-16 NOTE — Patient Instructions (Addendum)
You will be contacted about your Percutaneous Chole Tube from IR Radiology at [facility] We will contact [name] at home [phone] or cell at [phone]   Orders: Flush For PICC LINE Flush with of Normal Saline and of Heparin Flush every 24 hours If not used for medication treatment

## 2011-04-17 ENCOUNTER — Inpatient Hospital Stay: Payer: Medicare Other | Admitting: Pulmonary Disease

## 2011-04-17 ENCOUNTER — Telehealth: Payer: Self-pay | Admitting: *Deleted

## 2011-04-17 ENCOUNTER — Other Ambulatory Visit: Payer: Self-pay | Admitting: *Deleted

## 2011-04-17 DIAGNOSIS — K8020 Calculus of gallbladder without cholecystitis without obstruction: Secondary | ICD-10-CM

## 2011-04-17 NOTE — Telephone Encounter (Signed)
Called to get procedure scheduled, never received call back from [place] the scheduler yesterday They are to call me back with an appointment time today for pt. Gave them Lister Brizzi cell#and Home phone#

## 2011-04-17 NOTE — Telephone Encounter (Signed)
IR called, they are going to contact [name] to try and schedule [name] for Monday at 12 noon.    Tried to call [name] back at [facility], there was no answer

## 2011-04-17 NOTE — Telephone Encounter (Signed)
[name] is having his Chole Tube put in on Monday at 12pm in IR  Wife informed

## 2011-04-18 ENCOUNTER — Telehealth: Payer: Self-pay | Admitting: Internal Medicine

## 2011-04-18 NOTE — Telephone Encounter (Signed)
Home health RN called to obtain order for PICC flushes. Recently completed home IV abx and decision made to keep PICC in for several additional days until perc biliary drain placed. Verbal order given to flush PICC daily to maintain patency.

## 2011-04-20 ENCOUNTER — Other Ambulatory Visit: Payer: Self-pay | Admitting: Gastroenterology

## 2011-04-20 ENCOUNTER — Ambulatory Visit (HOSPITAL_COMMUNITY)
Admission: RE | Admit: 2011-04-20 | Discharge: 2011-04-20 | Disposition: A | Discharge status: Home / Self Care | Payer: Medicare Other | Source: Ambulatory Visit | Attending: Gastroenterology | Admitting: Gastroenterology

## 2011-04-20 DIAGNOSIS — K8020 Calculus of gallbladder without cholecystitis without obstruction: Secondary | ICD-10-CM

## 2011-04-20 DIAGNOSIS — G1221 Amyotrophic lateral sclerosis: Secondary | ICD-10-CM | POA: Insufficient documentation

## 2011-04-20 DIAGNOSIS — K8010 Calculus of gallbladder with chronic cholecystitis without obstruction: Secondary | ICD-10-CM | POA: Insufficient documentation

## 2011-04-20 LAB — COMPREHENSIVE METABOLIC PANEL
Alkaline Phosphatase: 94 U/L (ref 39–117)
BUN: 8 mg/dL (ref 6–23)
Chloride: 101 mEq/L (ref 96–112)
Creatinine, Ser: <0.47 mg/dL — ABNORMAL LOW (ref 0.50–1.35)
Glucose, Bld: 122 mg/dL — ABNORMAL HIGH (ref 70–99)
Potassium: 3.3 mEq/L — ABNORMAL LOW (ref 3.5–5.1)
Total Bilirubin: 0.4 mg/dL (ref 0.3–1.2)

## 2011-04-20 LAB — CBC
Hemoglobin: 11.2 g/dL — ABNORMAL LOW (ref 13.0–17.0)
MCH: 26 pg (ref 26.0–34.0)
MCHC: 32.7 g/dL (ref 30.0–36.0)
MCV: 79.4 fL (ref 78.0–100.0)
Platelets: 322 10*3/uL (ref 150–400)
RBC: 4.31 MIL/uL (ref 4.22–5.81)

## 2011-04-20 MED ORDER — IOHEXOL 300 MG/ML  SOLN: 15.0000 mL | Freq: Once | INTRAMUSCULAR | Status: AC | PRN

## 2011-04-20 NOTE — Telephone Encounter (Signed)
I had already faxed over orders for that on Friday.

## 2011-04-21 ENCOUNTER — Ambulatory Visit: Payer: Self-pay | Admitting: Pulmonary Disease

## 2011-04-21 ENCOUNTER — Other Ambulatory Visit: Payer: Self-pay

## 2011-04-21 MED ORDER — HYDROCODONE-ACETAMINOPHEN 5-500 MG PO TABS: 1.0000 | ORAL_TABLET | ORAL | Status: DC | PRN

## 2011-04-21 NOTE — Telephone Encounter (Signed)
Spoke with pts wife and they got in touch with Dr. Danielle Dess last night for refill on Lortab. Per Dr. Arlyce Dice [facility]. Rx sent to the pharmacy. Spoke with [facility] and gave orders for PICC line flushes and Biliary drain flushes.

## 2011-04-21 NOTE — Telephone Encounter (Signed)
Pts wife is requesting refill on his pain medicine, primary doc is not in office.

## 2011-04-24 LAB — BODY FLUID CULTURE: Gram Stain: NONE SEEN

## 2011-04-25 LAB — ANAEROBIC CULTURE

## 2011-04-27 ENCOUNTER — Telehealth: Payer: Self-pay | Admitting: Pulmonary Disease

## 2011-04-27 NOTE — Telephone Encounter (Signed)
LM with pt's son, Gerre Pebbles, TCB

## 2011-04-28 NOTE — Telephone Encounter (Signed)
Spoke with Gerre Pebbles, pt's son is requesting to speak with [name] on a conference call (since they are not local) with his siblings (family members). I advised him that [facility] is out of the office and will not return until Monday 05/04/2011. Caller is okay with waiting to hear back from [facility]. Dr. Shelle Iron, please advise a date and time when you will be available for a call on pt to family.

## 2011-04-28 NOTE — Telephone Encounter (Signed)
[email or web address]  

## 2011-04-28 NOTE — Telephone Encounter (Signed)
Returning call.

## 2011-05-04 NOTE — Telephone Encounter (Signed)
Patient's son calling back about message left last week.

## 2011-05-05 ENCOUNTER — Telehealth: Payer: Self-pay | Admitting: Pulmonary Disease

## 2011-05-05 DIAGNOSIS — J9610 Chronic respiratory failure, unspecified whether with hypoxia or hypercapnia: Secondary | ICD-10-CM

## 2011-05-05 NOTE — Telephone Encounter (Signed)
Ok with me 

## 2011-05-05 NOTE — Telephone Encounter (Signed)
lmom that i am available for conference call tomorrow afternoon or Friday afternoon.  He is to call back to let me know what works for him.

## 2011-05-05 NOTE — Telephone Encounter (Signed)
KC, are you okay with ordering vest through [facility] for this patient? Please advise thanks!

## 2011-05-06 ENCOUNTER — Other Ambulatory Visit: Payer: Self-pay | Admitting: Gastroenterology

## 2011-05-06 NOTE — Telephone Encounter (Signed)
Please let him know Friday is ok, but see if we can do at 615pm if possible. Give me note back to verify.

## 2011-05-06 NOTE — Telephone Encounter (Signed)
I spoke with Mrs. Kem and she states that [name] is going to be calling today to let us know about this. Will await Garrett's call

## 2011-05-06 NOTE — Telephone Encounter (Signed)
Pts nurse called and states that the pt has a low profile feeding tube. She went to feed him and checked the residual and there was 10cc of dark colored, oily based liquid. She states it is similar to what is draining out of the chole tube. Pt is also c/o some abdominal pain. Nurse just wanted to make sure this was ok. Please advise.

## 2011-05-06 NOTE — Telephone Encounter (Signed)
I spoke with pt wife and she is aware Dr. Shelle Iron said it was okay for pt to get vest through [facility]. I have sent order to [facility]. Nothing further was needed

## 2011-05-06 NOTE — Telephone Encounter (Signed)
Pt's son, Gerre Pebbles,  called in & stated that Friday, 11/2 is a good day for a conference call.  Gerre Pebbles stated that sometime after 6:30 PM would be preferred, however if KC is not available, then to pick a different time.  Please call him at [phone] & advise when to expect that call.  Also, this is the number the pt requests to use for the conference call.  [name]  (sent to [facility] as per Mindy's request)

## 2011-05-07 NOTE — Telephone Encounter (Signed)
LMTCB for [employer].

## 2011-05-07 NOTE — Telephone Encounter (Signed)
Its ok.  

## 2011-05-07 NOTE — Telephone Encounter (Signed)
Spoke with the nurse and she is aware.

## 2011-05-08 NOTE — Telephone Encounter (Signed)
Spoke with [name] and he states that 6:15 is fine, and still wants KC to call the [phone] number. Will forward to [facility] so that he is aware.

## 2011-05-10 DIAGNOSIS — J189 Pneumonia, unspecified organism: Secondary | ICD-10-CM

## 2011-05-10 DIAGNOSIS — A0472 Enterocolitis due to Clostridium difficile, not specified as recurrent: Secondary | ICD-10-CM

## 2011-05-10 DIAGNOSIS — N390 Urinary tract infection, site not specified: Secondary | ICD-10-CM

## 2011-05-10 DIAGNOSIS — B965 Pseudomonas (aeruginosa) (mallei) (pseudomallei) as the cause of diseases classified elsewhere: Secondary | ICD-10-CM

## 2011-05-10 DIAGNOSIS — Z87440 Personal history of urinary (tract) infections: Secondary | ICD-10-CM

## 2011-05-10 DIAGNOSIS — Z9911 Dependence on respirator [ventilator] status: Secondary | ICD-10-CM

## 2011-05-10 DIAGNOSIS — G1221 Amyotrophic lateral sclerosis: Secondary | ICD-10-CM

## 2011-05-10 DIAGNOSIS — K810 Acute cholecystitis: Secondary | ICD-10-CM

## 2011-05-11 ENCOUNTER — Telehealth: Payer: Self-pay

## 2011-05-11 NOTE — Telephone Encounter (Signed)
Spoke with his home nurse, [name], and let her know Dr. Marzetta Board recommendations.

## 2011-05-11 NOTE — Telephone Encounter (Signed)
Message copied by Michele Mcalpine on Mon May 11, 2011  1:05 PM ------      Message from: Melvia Heaps D      Created: Mon May 11, 2011 11:47 AM       I would apply hydrogen peroxide to the drain site twice a day      ----- Message -----         From: Lily Lovings, RN         Sent: 05/11/2011  10:21 AM           To: Louis Meckel, MD            Received a call from his nurse this morning. Nurse states that there has been some hard crusty areas around the gallbladder stent site with some purulent drainage. Nurse states that when she flushed it with saline there was a little leakage of the saline where the stitches are. Nurse states there has been no fever. Pt has been having some abdominal pain but not in the area of the drain. Dr. Arlyce Dice please advise.            ----- Message -----         From: Louis Meckel, MD         Sent: 05/11/2011   9:45 AM           To: Lily Lovings, RN            Bonita Quin,      Please contact Mr. Akbar is family to see how he's doing, specifically, whether he is still having abdominal pain.

## 2011-05-12 NOTE — Telephone Encounter (Signed)
KC, please advise if you have already taken care of this phone note---if so please document and sign the note. Thanks.

## 2011-05-12 NOTE — Telephone Encounter (Signed)
done

## 2011-05-14 ENCOUNTER — Telehealth: Payer: Self-pay | Admitting: Gastroenterology

## 2011-05-14 ENCOUNTER — Ambulatory Visit (HOSPITAL_COMMUNITY)
Admission: RE | Admit: 2011-05-14 | Discharge: 2011-05-14 | Disposition: A | Discharge status: Home / Self Care | Payer: Medicare Other | Source: Ambulatory Visit | Attending: Gastroenterology | Admitting: Gastroenterology

## 2011-05-14 ENCOUNTER — Other Ambulatory Visit: Payer: Self-pay | Admitting: Gastroenterology

## 2011-05-14 VITALS — BP 129/76 | HR 108

## 2011-05-14 DIAGNOSIS — K819 Cholecystitis, unspecified: Secondary | ICD-10-CM

## 2011-05-14 DIAGNOSIS — T85898A Other specified complication of other internal prosthetic devices, implants and grafts, initial encounter: Secondary | ICD-10-CM | POA: Insufficient documentation

## 2011-05-14 DIAGNOSIS — Y831 Surgical operation with implant of artificial internal device as the cause of abnormal reaction of the patient, or of later complication, without mention of misadventure at the time of the procedure: Secondary | ICD-10-CM | POA: Insufficient documentation

## 2011-05-14 MED ORDER — IOHEXOL 300 MG/ML  SOLN: 10.0000 mL | Freq: Once | INTRAMUSCULAR | Status: AC | PRN

## 2011-05-14 NOTE — Telephone Encounter (Signed)
If they're having difficulty flushing the tube then they need to speak with IR who placed the tube

## 2011-05-14 NOTE — Telephone Encounter (Signed)
Spoke with [name] and she was instructed to call [facility] IR dept.

## 2011-05-14 NOTE — Telephone Encounter (Signed)
Spoke with pts nurse and she states they have been using hydrogen peroxide twice a day around the gallbladder stent site. She reports that it looks worse, that it is red and there is crusty brown material around the site. Pt is complaining of pain at the site and when they flush with saline, the saline in leaking out. Dr. Arlyce Dice please advise.

## 2011-05-15 ENCOUNTER — Telehealth: Payer: Self-pay | Admitting: *Deleted

## 2011-05-15 NOTE — Telephone Encounter (Signed)
Spoke with Mardi Mainland with [facility]. Patient went to IR and Dr. Grace Isaac examined the tube. He told them it was fine and works fine. He did ask them to see if Dr. Arlyce Dice would order Neosporin ointment to be used around site with daily dressing change. Is this okay? Please, advise

## 2011-05-15 NOTE — Telephone Encounter (Signed)
Spoke with Revonda Standard and told her it is ok to use Neosporin ointment around patient's tube. She will fax an order to be signed.

## 2011-05-15 NOTE — Telephone Encounter (Signed)
Call her she needs to discuss patients tube site.

## 2011-05-15 NOTE — Telephone Encounter (Signed)
ok 

## 2011-05-17 ENCOUNTER — Emergency Department (HOSPITAL_COMMUNITY): Payer: Medicare Other

## 2011-05-17 ENCOUNTER — Emergency Department (HOSPITAL_COMMUNITY)
Admission: EM | Admit: 2011-05-17 | Discharge: 2011-05-18 | Disposition: A | Discharge status: Home Health | Payer: Medicare Other | Attending: Emergency Medicine | Admitting: Emergency Medicine

## 2011-05-17 ENCOUNTER — Telehealth: Payer: Self-pay | Admitting: Gastroenterology

## 2011-05-17 ENCOUNTER — Encounter (HOSPITAL_COMMUNITY): Payer: Self-pay | Admitting: Emergency Medicine

## 2011-05-17 DIAGNOSIS — Z930 Tracheostomy status: Secondary | ICD-10-CM | POA: Insufficient documentation

## 2011-05-17 DIAGNOSIS — K819 Cholecystitis, unspecified: Secondary | ICD-10-CM | POA: Insufficient documentation

## 2011-05-17 DIAGNOSIS — J189 Pneumonia, unspecified organism: Secondary | ICD-10-CM | POA: Insufficient documentation

## 2011-05-17 DIAGNOSIS — Z9889 Other specified postprocedural states: Secondary | ICD-10-CM | POA: Insufficient documentation

## 2011-05-17 DIAGNOSIS — G1221 Amyotrophic lateral sclerosis: Secondary | ICD-10-CM | POA: Insufficient documentation

## 2011-05-17 DIAGNOSIS — R509 Fever, unspecified: Secondary | ICD-10-CM | POA: Insufficient documentation

## 2011-05-17 DIAGNOSIS — R000 Tachycardia, unspecified: Secondary | ICD-10-CM | POA: Insufficient documentation

## 2011-05-17 DIAGNOSIS — R109 Unspecified abdominal pain: Secondary | ICD-10-CM | POA: Insufficient documentation

## 2011-05-17 DIAGNOSIS — R21 Rash and other nonspecific skin eruption: Secondary | ICD-10-CM | POA: Insufficient documentation

## 2011-05-17 HISTORY — DX: Panic disorder (episodic paroxysmal anxiety): F41.0

## 2011-05-17 HISTORY — DX: Dependence on respirator (ventilator) status: Z99.11

## 2011-05-17 LAB — URINALYSIS, ROUTINE W REFLEX MICROSCOPIC
Bilirubin Urine: NEGATIVE
Hgb urine dipstick: NEGATIVE
Specific Gravity, Urine: 1.01 (ref 1.005–1.030)
Urobilinogen, UA: 0.2 mg/dL (ref 0.0–1.0)
pH: 7 (ref 5.0–8.0)

## 2011-05-17 LAB — COMPREHENSIVE METABOLIC PANEL
ALT: 57 U/L — ABNORMAL HIGH (ref 0–53)
AST: 54 U/L — ABNORMAL HIGH (ref 0–37)
CO2: 23 mEq/L (ref 19–32)
Calcium: 9.6 mg/dL (ref 8.4–10.5)
Potassium: 3.3 mEq/L — ABNORMAL LOW (ref 3.5–5.1)
Sodium: 133 mEq/L — ABNORMAL LOW (ref 135–145)

## 2011-05-17 LAB — DIFFERENTIAL
Eosinophils Relative: 0 % (ref 0–5)
Lymphocytes Relative: 1 % — ABNORMAL LOW (ref 12–46)
Monocytes Absolute: 1.2 10*3/uL — ABNORMAL HIGH (ref 0.1–1.0)
Monocytes Relative: 4 % (ref 3–12)
Neutrophils Relative %: 95 % — ABNORMAL HIGH (ref 43–77)

## 2011-05-17 LAB — CBC
Platelets: 393 10*3/uL (ref 150–400)
RBC: 4.71 MIL/uL (ref 4.22–5.81)
WBC: 30.6 10*3/uL — ABNORMAL HIGH (ref 4.0–10.5)

## 2011-05-17 MED ORDER — PIPERACILLIN-TAZOBACTAM 3.375 G IVPB: 3.3750 g | Freq: Once | INTRAVENOUS | Status: DC

## 2011-05-17 MED ORDER — VANCOMYCIN HCL IN DEXTROSE 1-5 GM/200ML-% IV SOLN
1000.0000 mg | Freq: Once | INTRAVENOUS | Status: AC
  Administered 2011-05-17: 1000 mg via INTRAVENOUS
  Filled 2011-05-17: qty 200

## 2011-05-17 MED ORDER — IOHEXOL 300 MG/ML  SOLN
100.0000 mL | Freq: Once | INTRAMUSCULAR | Status: AC | PRN
  Administered 2011-05-17: 100 mL via INTRAVENOUS

## 2011-05-17 MED ORDER — PIPERACILLIN-TAZOBACTAM 3.375 G IVPB 30 MIN
3.3750 g | INTRAVENOUS | Status: AC
Start: 2011-05-17 — End: 2011-05-18
  Administered 2011-05-18: 3.375 g via INTRAVENOUS
  Filled 2011-05-17: qty 50

## 2011-05-17 MED ORDER — IPRATROPIUM BROMIDE 0.02 % IN SOLN
0.5000 mg | Freq: Once | RESPIRATORY_TRACT | Status: AC
  Administered 2011-05-17: 0.5 mg via RESPIRATORY_TRACT
  Filled 2011-05-17: qty 2.5

## 2011-05-17 MED ORDER — ACETAMINOPHEN 500 MG PO TABS
ORAL_TABLET | ORAL | Status: AC
  Administered 2011-05-17: 1000 mg
  Filled 2011-05-17: qty 2

## 2011-05-17 MED ORDER — ALBUTEROL SULFATE (5 MG/ML) 0.5% IN NEBU
5.0000 mg | INHALATION_SOLUTION | Freq: Once | RESPIRATORY_TRACT | Status: AC
  Administered 2011-05-17: 5 mg via RESPIRATORY_TRACT
  Filled 2011-05-17: qty 1

## 2011-05-17 NOTE — ED Notes (Signed)
Family at bedside. 

## 2011-05-17 NOTE — ED Notes (Signed)
Patient denies pain and is resting comfortably.  

## 2011-05-17 NOTE — ED Notes (Signed)
#  1 condom cath #2 biliary drain #3 g-tube

## 2011-05-17 NOTE — ED Notes (Signed)
CT pending RRT called for assist to CT for scan- Pt with no acute changes- family present.  Pt suctioned by caregiver RN present at bs per family request

## 2011-05-17 NOTE — ED Notes (Signed)
Per EMS: Pt has ALS.  Family c/o fever, vomiting blood.  Drain put in gallbladder 4 weeks ago.  Red around site.  [facility] for this [facility] days ago.  Rash going down his back.  Family states this happens when he has a fever.  Pt is nonverbal and completely paralyzed.  Family communicates with pt by having him move his eyes to one side or the other.

## 2011-05-17 NOTE — Progress Notes (Signed)
Pt has a 6 cuffed shiley trach. Pt on home vent LTD.

## 2011-05-17 NOTE — ED Notes (Signed)
Caregiver present- will deliver CT contrast via G tube

## 2011-05-17 NOTE — Progress Notes (Signed)
Pt came in ED on home vent. Family refused for pt to go on hospital vent. Rt called [facility]) to come and check out home vent.

## 2011-05-17 NOTE — ED Notes (Signed)
Returned from CT- pt tolerated.

## 2011-05-17 NOTE — ED Notes (Signed)
[id number]<AV> Expected date:05/17/11<BR> Expected time: 3:57 PM<BR> Means of arrival:Ambulance<BR> Comments:<BR> EMS 50 GC - ALS pt/assisting ventilations

## 2011-05-17 NOTE — Telephone Encounter (Signed)
I spoke with Mrs. Cruces who stated that her husband is running a low grade fever and has a skin rash.  His cholecstostomy tube was checked 2-3 days ago.  He apparantly is not having any new respiratory difficulties.  I advised her to go the the ER for evaluation since it is difficult to determine the etiology of his symptoms

## 2011-05-17 NOTE — ED Notes (Signed)
See pre-hospital note.

## 2011-05-17 NOTE — ED Notes (Signed)
Patient is resting comfortably. 

## 2011-05-17 NOTE — ED Notes (Signed)
Seen here last Thursday for fever and gallbladder drain.  OK to send home- Pt is nonverbal- cared for at home- Drain to RUQ intact DCDI- abdominal area distended and soft- bowel sounds present.  Generalized edema present- Per caregiver "this is normal"  Recent dx pneumonia in sept/2012

## 2011-05-17 NOTE — ED Notes (Signed)
Transferred to CT [facility] RN assisted with trasnfer

## 2011-05-17 NOTE — ED Provider Notes (Signed)
History     CSN: [id number] Arrival date & time: 05/17/2011  4:34 PM   First MD Initiated Contact with Patient 05/17/11 1822      Chief Complaint  Patient presents with  . Fever    (Consider location/radiation/quality/duration/timing/severity/associated sxs/prior treatment) HPI Comments: Pt with ALS and on the vent due to end stages.  Also has a drain from cholecystitis.  Fever for the last few days and per wife pt c/o of abd pain  Patient is a 55 y.o. male presenting with fever.  Fever Primary symptoms of the febrile illness include fever, abdominal pain and rash. Primary symptoms do not include shortness of breath, nausea, vomiting, diarrhea or altered mental status. The current episode started 2 days ago. This is a new problem. The problem has been gradually worsening.  The fever began 2 days ago. The fever has been gradually worsening since its onset. The maximum temperature recorded prior to his arrival was 102 to 102.9 F. The temperature was taken by an axillary reading.  The rash began yesterday. The rash appears on the torso. The pain associated with the rash is moderate. The rash is not associated with blisters, itching or weeping.  Associated with: has been off antibiotics for over a week. Risk factors for febrile illness include implanted device and prosthesis.   Past Medical History  Diagnosis Date  . ALS (amyotrophic lateral sclerosis)     vent dependent  . Panic attack   . Ventilator dependent     Past Surgical History  Procedure Date  . Pic line     History reviewed. No pertinent family history.  History  Substance Use Topics  . Smoking status: Never Smoker   . Smokeless tobacco: Never Used  . Alcohol Use: No      Review of Systems  Unable to perform ROS Constitutional: Positive for fever.  Respiratory: Negative for shortness of breath.   Gastrointestinal: Positive for abdominal pain. Negative for nausea, vomiting and diarrhea.  Skin: Positive for  rash. Negative for itching.  Psychiatric/Behavioral: Negative for altered mental status.    Allergies  Amitriptyline hcl; Cephalosporins; Glutathione; Jevity; Latex; Levofloxacin; Sulfamethoxazole w/trimethoprim; Cefdinir; and Dicyclomine hcl  Home Medications   Current Outpatient Rx  Name Route Sig Dispense Refill  . ACETAMINOPHEN 500 MG PO TABS  as needed.  No more than 6 tabs in a 24 hours period     . ALBUTEROL SULFATE (2.5 MG/3ML) 0.083% IN NEBU  USE 1 NEBULE EVERY 4 HOURS 1350 mL 0  . ALUM HYDROXIDE-MAG CARBONATE 31.7-137 MG/5ML PO SUSP  52ml/tsp.  Via g-tube up to four times per day as needed for stomach upset or gas. Give 2 to 4 tsp (10-20ml)    . BACITRACIN-POLYMYXIN B 500-10000 UNIT/GM EX OINT Topical Apply topically daily. To G tube    . BACLOFEN 10 MG PO TABS Oral Take 10 mg by mouth 3 (three) times daily. Via G-tube     . MYLANTA PO  Give per package directions as needed for indigestion.     Marland Kitchen CETIRIZINE HCL 5 MG/5ML PO SYRP  Give 5mg  (ml) twice daily.  May substitue Clarinex 5mg  once daily.    Marland Kitchen CLONAZEPAM 0.5 MG PO TABS Oral Take 0.5 mg by mouth 2 (two) times daily as needed.      . DESLORATADINE 0.5 MG/ML PO SYRP  Give 5mg  (10mL) once daily via G-tube     . DELSYM PO Oral Take by mouth. Give 2 tsp (10ml) every 12 hours as  needed for congestion.     Marland Kitchen DIPHENHYDRAMINE HCL 12.5 MG/5ML PO ELIX  Give 25mg  (10mL) every 4 hours as needed for itching    . FISH OIL OIL  5mL per G-tube three times daily      . HYDROCODONE-ACETAMINOPHEN 5-500 MG PO TABS Oral Take 1 tablet by mouth every 4 (four) hours as needed. For pain     . IPRATROPIUM BROMIDE 0.02 % IN SOLN  USE 1 AMPULE 4 TIMES A DAY 1125 mL 5  . KETOROLAC TROMETHAMINE 0.45 % OP SOLN Both Eyes Place 1 drop into both eyes 2 (two) times daily.      Marland Kitchen LANSOPRAZOLE 3 MG/ML SUSP  5 ml via gastrostomy tube twice daily.    Marland Kitchen LANSOPRAZOLE 30 MG PO CPDR  TAKE VIA G-TUBE TWICE DAILY AS DIRECTED. 60 capsule 4  . LIDOCAINE VISCOUS 2 %  MT SOLN  Applied topically to trach stoma site as needed     . LORAZEPAM 0.5 MG PO TABS Oral Take 0.5 mg by mouth every 4 (four) hours as needed. For anxiety     . MAGNESIUM HYDROXIDE 400 MG/5ML PO SUSP Oral Take by mouth daily as needed.      . MORPHINE SULFATE 15 MG PO TABS Oral Take 15 mg by mouth every 6 (six) hours as needed. For pain    . MULTIVITAMIN & MINERAL PO LIQD  Take 2 tablespoons by G-tube daily     . NONI JUICE PO Oral Take 30 mLs by mouth 2 (two) times daily.      Marland Kitchen PULMOCARE PO LIQD Oral Take 1 Can by mouth as directed. 1 can of pulmocare mixed with 3 scoops of beneprotein    . POLYETHYL GLYCOL-PROPYL GLYCOL 0.4-0.3 % OP SOLN  1 drop every 2 (two) hours as needed. For dry eyes    . POLYETHYLENE GLYCOL 3350 PO PACK Oral Take 17 g by mouth daily.      Marland Kitchen POLYETHYLENE GLYCOL 3350 PO POWD  TAKE 17 GM. IN 8 OZ. OF JUICE OR WATER. MAY USE AS MANY TIMES AS NEEDED FOR CONSTIPATION. 3689 g 0  . TEMAZEPAM 30 MG PO CAPS Oral Take 30 mg by mouth at bedtime as needed. For sleep    . VENLAFAXINE HCL 75 MG PO TABS Oral Take 75 mg by mouth 3 (three) times daily. Mixed in 80 ml of pulmocare    . VITAMIN B-12 500 MCG PO TABS  2 tabs via G-tube daily     . BENEFIBER PO Oral Take 15 mLs by mouth daily. In 120 ml of water     . CARBAMIDE PEROXIDE 6.5 % OT SOLN  2-3 drops. In affected ear as needed for wax removal    . CYANOCOBALAMIN 1000 MCG/ML IJ SOLN Intramuscular Inject 1,000 mcg into the muscle 2 (two) times a week. Mondays and Thursdays    . NYSTATIN 100000 UNIT/ML MT SUSP  Swab mouth with 1 teaspoon four times daily as needed for mouth sores       BP 126/71  Pulse 114  Temp(Src) 99.9 F (37.7 C) (Axillary)  Resp 19  SpO2 95%  Physical Exam  Nursing note and vitals reviewed. Constitutional: He appears well-developed and well-nourished. No distress.  HENT:  Head: Normocephalic and atraumatic.  Mouth/Throat: Oropharynx is clear and moist.  Eyes: Conjunctivae and EOM are normal. Pupils  are equal, round, and reactive to light.  Neck: Normal range of motion. Neck supple.       Janina Mayo  in place without secretions  Cardiovascular: Regular rhythm and intact distal pulses.  Tachycardia present.   No murmur heard. Pulmonary/Chest: Effort normal. No respiratory distress. He has no wheezes. He has rhonchi in the right lower field and the left lower field. He has rales in the right lower field and the left lower field.  Abdominal: Soft. He exhibits no distension. There is generalized tenderness. There is no rebound and no guarding.    Musculoskeletal: Normal range of motion. He exhibits no edema and no tenderness.  Neurological: He is alert.       Unable to move arms or legs  Skin: Skin is warm and dry. Rash noted. Rash is macular. No erythema.       Blanching rash over bilateral flanks and neck  Psychiatric: He has a normal mood and affect. His behavior is normal.    ED Course  Procedures (including critical care time)  Results for orders placed during the hospital encounter of 05/17/11  CBC      Component Value Range   WBC 30.6 (*) 4.0 - 10.5 (K/uL)   RBC 4.71  4.22 - 5.81 (MIL/uL)   Hemoglobin 12.3 (*) 13.0 - 17.0 (g/dL)   HCT 16.1 (*) 09.6 - 52.0 (%)   MCV 78.8  78.0 - 100.0 (fL)   MCH 26.1  26.0 - 34.0 (pg)   MCHC 33.2  30.0 - 36.0 (g/dL)   RDW 04.5 (*) 40.9 - 15.5 (%)   Platelets 393  150 - 400 (K/uL)  DIFFERENTIAL      Component Value Range   Neutrophils Relative 95 (*) 43 - 77 (%)   Lymphocytes Relative 1 (*) 12 - 46 (%)   Monocytes Relative 4  3 - 12 (%)   Eosinophils Relative 0  0 - 5 (%)   Basophils Relative 0  0 - 1 (%)   Neutro Abs 29.1 (*) 1.7 - 7.7 (K/uL)   Lymphs Abs 0.3 (*) 0.7 - 4.0 (K/uL)   Monocytes Absolute 1.2 (*) 0.1 - 1.0 (K/uL)   Eosinophils Absolute 0.0  0.0 - 0.7 (K/uL)   Basophils Absolute 0.0  0.0 - 0.1 (K/uL)   WBC Morphology WHITE COUNT CONFIRMED ON SMEAR    COMPREHENSIVE METABOLIC PANEL      Component Value Range   Sodium 133 (*)  135 - 145 (mEq/L)   Potassium 3.3 (*) 3.5 - 5.1 (mEq/L)   Chloride 97  96 - 112 (mEq/L)   CO2 23  19 - 32 (mEq/L)   Glucose, Bld 164 (*) 70 - 99 (mg/dL)   BUN 8  6 - 23 (mg/dL)   Creatinine, Ser <8.11 (*) 0.50 - 1.35 (mg/dL)   Calcium 9.6  8.4 - 91.4 (mg/dL)   Total Protein 8.6 (*) 6.0 - 8.3 (g/dL)   Albumin 3.3 (*) 3.5 - 5.2 (g/dL)   AST 54 (*) 0 - 37 (U/L)   ALT 57 (*) 0 - 53 (U/L)   Alkaline Phosphatase 153 (*) 39 - 117 (U/L)   Total Bilirubin 0.2 (*) 0.3 - 1.2 (mg/dL)   GFR calc non Af Amer NOT CALCULATED  >90 (mL/min)   GFR calc Af Amer NOT CALCULATED  >90 (mL/min)  URINALYSIS, ROUTINE W REFLEX MICROSCOPIC      Component Value Range   Color, Urine YELLOW  YELLOW    Appearance CLEAR  CLEAR    Specific Gravity, Urine 1.010  1.005 - 1.030    pH 7.0  5.0 - 8.0    Glucose, UA  NEGATIVE  NEGATIVE (mg/dL)   Hgb urine dipstick NEGATIVE  NEGATIVE    Bilirubin Urine NEGATIVE  NEGATIVE    Ketones, ur NEGATIVE  NEGATIVE (mg/dL)   Protein, ur NEGATIVE  NEGATIVE (mg/dL)   Urobilinogen, UA 0.2  0.0 - 1.0 (mg/dL)   Nitrite NEGATIVE  NEGATIVE    Leukocytes, UA NEGATIVE  NEGATIVE   LIPASE, BLOOD      Component Value Range   Lipase 30  11 - 59 (U/L)     Dg Chest Portable 1 View  05/17/2011  *RADIOLOGY REPORT*  Clinical Data: Fever.  Amyotrophic lateral sclerosis.  Ventilator dependent.  PORTABLE CHEST - 1 VIEW  Comparison: 04/02/2011  Findings: Tracheostomy tube and PICC line appear in good position. The patient has developed diffuse accentuation of the interstitial markings in both lungs which could represent edema or pneumonitis. Small bilateral pleural effusions, unchanged.  Heart size and vascularity are normal.  IMPRESSION: New interstitial infiltrates which could be infectious or edema. No change in effusions.  Original Report Authenticated By: Gwynn Burly, M.D.      No diagnosis found.    MDM   Pt with hx of ALS in the end stage on the vent and has a JP for gallstones  and fever for the last 3 days.  Pt also c/o of abd pain per wife.  Temp here is 103 and deny increased residual, new increased sputum from trach or change in UOP.  Pt also has macular rash over sides and neck.  Wife states here to find out what is causing fever but they want to go home.  Pt is awake and alert.  Tachy her but due to fever.  CBC with leukocytosis of 30,000 with neg urine and CXR with possible new infiltrates but may be edema and without increased trach secretions will wait to see CT of abd for other causes of fever.  Only mild LFTs but lipase wnl.    11:26 PM Pt 's fever has broke and tachycardia improved.  Ct showed new bilateral lower lobe PNA. Treated with vanc/zosyn.  Discussed with wife and pt will go back home and they will call Dr. Shelle Iron in the morning.  Spoke with PCCM and they will relay the message.   Gwyneth Sprout, MD 05/17/11 2328

## 2011-05-18 ENCOUNTER — Telehealth: Payer: Self-pay | Admitting: Pulmonary Disease

## 2011-05-18 MED ORDER — HEPARIN SOD (PORK) LOCK FLUSH 100 UNIT/ML IV SOLN: 250.0000 [IU] | INTRAVENOUS | Status: DC | PRN

## 2011-05-18 NOTE — Telephone Encounter (Signed)
Talked with pt's wife.  Had fever, abd distention, abd pain.  Went to ER and found to have increased WBC's.  cxr with usual basilar volume loss and effusions, ct abd with no acute process, and did show bilat LL atx with effusions as before.  No increased sputum or purulence above baseline.  Pt given vanc and zosyn in ER and sent home.  Has been continued by primary md in home setting. Suspect this is an abdominal source for infection given history, rather than chest.  Pt will continue on vanco/zosyn per primary md.

## 2011-05-18 NOTE — ED Notes (Signed)
EDP Plunkett to speak with wife regarding plan of care.  Continue with IV antibiotics at present and will discharge this pt home

## 2011-05-18 NOTE — ED Notes (Signed)
[facility] called for transport back to home- Pt and family aware

## 2011-05-21 LAB — URINE CULTURE: Colony Count: >=100000

## 2011-05-23 LAB — CULTURE, BLOOD (ROUTINE X 2)
Culture  Setup Time: 201211112304
Culture: NO GROWTH

## 2011-05-24 LAB — CULTURE, BLOOD (ROUTINE X 2)

## 2011-05-24 NOTE — ED Notes (Signed)
(+)   URNC, >/= 100,000 colonies -> E Coli & Klebsiella Pneu. .  Pt's family  was instructed to call Dr Teddy Spike office 11/12.  The telephone note from 11/12 to his office stated will cont. W/Vanc and Zosyn.  Will fax [facility] results to Dr Teddy Spike attn to ensure continuity of care.

## 2011-05-25 ENCOUNTER — Telehealth: Payer: Self-pay | Admitting: Gastroenterology

## 2011-05-25 NOTE — Telephone Encounter (Signed)
Per home health nurse pt need another adhesive anchor to hold his chole tube in place, she also wants to know if it is ok that he only has one stitch left holding the tube in place. Called radiology at [facility] and wife may call [phone] and let them know when she can come by the radiology check-in area and they will have an anchor there for her to pick up. Per radiology it is ok that there is only one stitch in place. [name] the home health nurse aware.

## 2011-05-26 ENCOUNTER — Telehealth: Payer: Self-pay | Admitting: Pulmonary Disease

## 2011-05-26 NOTE — Telephone Encounter (Signed)
lmomtcb for jackie 

## 2011-05-26 NOTE — Telephone Encounter (Signed)
I spoke with jackie and states pt's picc line is clogged and wants verbal order for okay for cath flow to unclog picc line. Please advise Dr. Shelle Iron, thanks

## 2011-05-26 NOTE — Telephone Encounter (Signed)
Ok to follow picc line protocol to re-open flow.

## 2011-05-27 NOTE — Telephone Encounter (Signed)
Ok with me,

## 2011-05-27 NOTE — Telephone Encounter (Signed)
I spoke with Annice Pih and is aware okay to follow picc line protocol to re-open pt's flow. She states she needs a verbal also to continue seeing pt as home health nurse visits. She states the order expires 06/04/11. She states we can give verbal okay. Please advise Dr. Shelle Iron, thanks

## 2011-05-27 NOTE — Telephone Encounter (Signed)
Verbal okay given to Annice Pih to continue nurse visits.

## 2011-06-01 ENCOUNTER — Telehealth: Payer: Self-pay | Admitting: Gastroenterology

## 2011-06-01 NOTE — Telephone Encounter (Signed)
Nurse calling to schedule a f/u appt. States they need an appt since the pt is 8 weeks out from gallbladder drain surgery. Pt scheduled to see Dr. Arlyce Dice 06/09/11@3 :15pm.

## 2011-06-04 ENCOUNTER — Other Ambulatory Visit: Payer: Self-pay | Admitting: Pulmonary Disease

## 2011-06-09 ENCOUNTER — Ambulatory Visit: Payer: Medicare Other | Admitting: Gastroenterology

## 2011-06-09 ENCOUNTER — Telehealth: Payer: Self-pay | Admitting: Gastroenterology

## 2011-06-09 NOTE — Telephone Encounter (Signed)
Pts wife is sick and she provides the transportation for the pts appts. Rescheduled the appt to 06/11/11@10 :30am.

## 2011-06-11 ENCOUNTER — Ambulatory Visit (INDEPENDENT_AMBULATORY_CARE_PROVIDER_SITE_OTHER): Payer: Medicare Other | Admitting: Gastroenterology

## 2011-06-11 ENCOUNTER — Encounter: Payer: Self-pay | Admitting: Gastroenterology

## 2011-06-11 VITALS — BP 110/70 | HR 87 | Temp 97.7°F | Wt 185.0 lb

## 2011-06-11 DIAGNOSIS — K810 Acute cholecystitis: Secondary | ICD-10-CM

## 2011-06-11 NOTE — Patient Instructions (Signed)
Follow up in 6 months 

## 2011-06-11 NOTE — Progress Notes (Signed)
History of Present Illness:  [name] has returned for followup of his cholecystostomy tube. Since placement of the tube his episodes of abdominal pain have significantly decreased. He occasionally conveys some indication that he has pain which is very transient. He continues to drain serosanguineous fluid in small amounts.    Review of Systems: Pertinent positive and negative review of systems were noted in the above HPI section. All other review of systems were otherwise negative.    Current Medications, Allergies, Past Medical History, Past Surgical History, Family History and Social History were reviewed in [employer] electronic medical record  Vital signs were reviewed in today's medical record. Physical Exam: General: Well developed , well nourished, no acute distress There is mild redness at the cholecystostomy site.

## 2011-06-11 NOTE — Assessment & Plan Note (Signed)
Status post cholecystostomy tube with resolution of pain.  Recommendations #1 continue percutaneous drainage so long as he still has drainage from the gallbladder.

## 2011-06-17 ENCOUNTER — Telehealth: Payer: Self-pay

## 2011-06-17 ENCOUNTER — Telehealth: Payer: Self-pay | Admitting: Gastroenterology

## 2011-06-17 NOTE — Telephone Encounter (Signed)
Can they quantitate how much blood is in the bag?   This is just a blood or also yellowish from biliary drainage? If it looks like straight blood that he needs a CBC. I think it is unlikely that he has significant bleeding.

## 2011-06-17 NOTE — Telephone Encounter (Signed)
CBC will be drawn later this evening by [facility].

## 2011-06-17 NOTE — Telephone Encounter (Signed)
Attempted to call back and keep getting busy signal.  Called new number [phone] spoke with [name]. States the drainage looks like just blood, about 50cc. Called [facility] and spoke with Annice Pih RN with [facility]. Gave order for cbc.

## 2011-06-17 NOTE — Telephone Encounter (Signed)
Nurse called and states that his drainage tube was draining dark brown drainage last night. This morning when they flushed his tube it is draining and there is bright red blood in the bag. Pt is running a low grade temp, has been for the past 2 days. 99.3 Axillary and pt is very lethargic today. Pt does not have any respiratory symptoms at all. Dr. Arlyce Dice please advise.

## 2011-06-17 NOTE — Telephone Encounter (Signed)
Called back to give different phone number.

## 2011-06-19 ENCOUNTER — Telehealth: Payer: Self-pay

## 2011-06-19 NOTE — Telephone Encounter (Signed)
Pts nurse is calling wanting the results of the CBC drawn by [facility] on Wed.  WBC 14.1 RBC 4.29 HGB 10.5 HCT 32.7 MCV 76.2 MCH 24.5 MCHC 32.1 RDW 17.4 Platelets 371 Granulocyte 90 Absolute gran 12.7 Lymph 3 Absolute Lymph 0.4

## 2011-06-22 NOTE — Telephone Encounter (Signed)
Forward the information

## 2011-07-01 ENCOUNTER — Other Ambulatory Visit: Payer: Self-pay | Admitting: Pulmonary Disease

## 2011-07-10 ENCOUNTER — Other Ambulatory Visit: Payer: Self-pay | Admitting: Gastroenterology

## 2011-07-10 MED ORDER — HYDROCODONE-ACETAMINOPHEN 5-500 MG PO TABS: 1.0000 | ORAL_TABLET | ORAL | Status: DC | PRN

## 2011-07-10 NOTE — Telephone Encounter (Signed)
Called Ms Lawerance Bach back at Mr Vanderwoude house. Faxed prescription into [facility] on [address].Marland KitchenMarland KitchenMarland Kitchen

## 2011-08-04 ENCOUNTER — Telehealth: Payer: Self-pay | Admitting: Gastroenterology

## 2011-08-04 NOTE — Telephone Encounter (Signed)
This can be cauterized with silver nitrate. If she cannot do it then we can do it in the office.

## 2011-08-04 NOTE — Telephone Encounter (Signed)
Spoke with nurse and they will pick up Silver Nitrate to use around the tube.

## 2011-08-04 NOTE — Telephone Encounter (Signed)
Nurse states that the pt is having increased bleeding at the granulated tissue around the G tube site. Calling to see if Dr. Arlyce Dice has any recommendations. Please advise.

## 2011-08-06 ENCOUNTER — Telehealth: Payer: Self-pay | Admitting: Pulmonary Disease

## 2011-08-06 NOTE — Telephone Encounter (Signed)
Called and spoke with pt's nurse, [name], who states pt needs refill on both Ativan and Lortab.  Pt has enough for tonight only and will run out tomorrow.  [name] is ok to wait until [facility] returns tomorrow to get ok for refill on both of these meds.  KC, please advise if ok to refill or not.  Thanks.

## 2011-08-07 MED ORDER — LORAZEPAM 0.5 MG PO TABS: 0.5000 mg | ORAL_TABLET | ORAL | Status: DC | PRN

## 2011-08-07 MED ORDER — HYDROCODONE-ACETAMINOPHEN 5-500 MG PO TABS: 1.0000 | ORAL_TABLET | ORAL | Status: DC | PRN

## 2011-08-07 NOTE — Telephone Encounter (Signed)
Reviewed pt's chart:  Last prescribed Lortab by Dr. Arlyce Dice on 07/10/11 for # 30 x 2 refills.  Dr. Shelle Iron last filled the Lorazepam 04/02/11 # 60 x 5 refills.  Spoke with [facility] about this.  OK to refill Lorazepam for # 60 x 5 refills.  Regarding the Lortab, ok to refill x 1 only- as future refills need to go through Dr. Arlyce Dice.     Called and spoke with [name], who is pt's nurse for the day, and informed her of the above information.  rx sent to pharmacy.  Jessica aware.

## 2011-08-11 ENCOUNTER — Telehealth: Payer: Self-pay

## 2011-08-11 MED ORDER — LORAZEPAM 1 MG PO TABS: ORAL_TABLET | ORAL | Status: DC

## 2011-08-11 MED ORDER — HYDROCODONE-ACETAMINOPHEN 5-500 MG PO TABS: ORAL_TABLET | ORAL | Status: DC

## 2011-08-11 NOTE — Telephone Encounter (Signed)
Spoke with [name] and she states the pt has only had about 2 ativan tablets the past couple of days. They will call his primary care physician.

## 2011-08-11 NOTE — Telephone Encounter (Signed)
[name] the home health nurse called and states that for the past few days the pt has been sleeping almost continuously and has not been taking a lot of meds. Nurse wants to know if Dr. Arlyce Dice would like to order some labs to see if pt may be anemic. Dr. Arlyce Dice please advise.

## 2011-08-11 NOTE — Telephone Encounter (Signed)
This could be do to Ativan.  The patient should have a primary care doctor who can address these problems.

## 2011-08-11 NOTE — Telephone Encounter (Signed)
ok 

## 2011-08-11 NOTE — Telephone Encounter (Signed)
Home health nurse Dagoberto Ligas is calling requesting that we call in rx for Ativan 1mg  1-2 every 4 hours as needed. Previous rx was for Ativan 0.5mg  but when he went to the hospital for his tube placement it was increased to 1mg . Requesting that we send a new [facility] to [facility] for the Ativan 1mg . Dr. Arlyce Dice please advise.  Return phone call [phone] [facility]

## 2011-08-11 NOTE — Telephone Encounter (Signed)
Rx sent to pharmacy. Spoke with [name] and she is aware.

## 2011-08-12 ENCOUNTER — Telehealth: Payer: Self-pay | Admitting: *Deleted

## 2011-08-12 NOTE — Telephone Encounter (Signed)
He apparently received a prescription from Dr. Stann Mainland and from myself. The prescriptions do not have to be rewritten. She should simply take a lower dose such as one tablet only. Please note that, if she filled both prescriptions, one tablet was 0.5 mg and the other tablet was 1 mg

## 2011-08-12 NOTE — Telephone Encounter (Signed)
Dr Arlyce Dice FYI-Patient's wife is concerned that 1-2 Ativan is too much for patient. Wanted rx rewritten for 1 tab every 4 hours as needed. Called pharmacy and told them only 1 every 4hours as needed

## 2011-08-13 NOTE — Telephone Encounter (Signed)
Completed contacted pharmacy to inform

## 2011-09-25 ENCOUNTER — Telehealth: Payer: Self-pay | Admitting: Gastroenterology

## 2011-09-25 NOTE — Telephone Encounter (Signed)
[name] states that the pts g tube is having some bleeding from the site and they are out of the silver nitrate sticks. Requesting more silver nitrate. Left package up front for pts wife to pick up. Jessica aware.

## 2011-09-29 ENCOUNTER — Other Ambulatory Visit: Payer: Self-pay | Admitting: Gastroenterology

## 2011-09-29 MED ORDER — PROXI-STRIPS MISC: 2.0000 | Status: DC

## 2011-09-30 ENCOUNTER — Telehealth: Payer: Self-pay

## 2011-09-30 NOTE — Telephone Encounter (Signed)
Spoke with Jackie and she is aware.

## 2011-09-30 NOTE — Telephone Encounter (Signed)
Is the PICC line used for anything? Who is his PCP at this time? I think it's all right to pull up a PICC line but this really has to come from his PCP

## 2011-09-30 NOTE — Telephone Encounter (Signed)
[facility] RN Annice Pih [phone]) is calling stating that the pts PICC line has been in for quite a while and he is not getting anything through it. Wanting to know if the PICC can be removed. States that the PICC has been in ever since the G tube was placed. Dr. Arlyce Dice please advise.

## 2011-10-01 ENCOUNTER — Telehealth: Payer: Self-pay | Admitting: Pulmonary Disease

## 2011-10-01 NOTE — Telephone Encounter (Signed)
Per Annice Pih, Dr. Arlyce Dice gave the okay for PICC line to be removed but asked that she check with his PCP, Dr. Shelle Iron before doing this. Pt has PICC line in place for percutaneous biliary drain per Annice Pih. She is aware that Dr. Shelle Iron will not be back in the office until Mon 4/1 and it will be fine to call her back after we discuss this with him. Pls advise.

## 2011-10-05 NOTE — Telephone Encounter (Signed)
I have no idea of the status of his biliary drain, does he still need abx, what is his infection risk, all of these things.  I probably am not the right person to ask about discontinuation.  It all depends upon the status of his gallbladder, and recent infections, etc.

## 2011-10-06 NOTE — Telephone Encounter (Signed)
lmomtcb x1 for [employer]

## 2011-10-07 NOTE — Telephone Encounter (Signed)
LMOM TCB x1 for Jackie. 

## 2011-10-07 NOTE — Telephone Encounter (Signed)
Annice Pih returning call.  [phone]

## 2011-10-07 NOTE — Telephone Encounter (Signed)
Ok to d/c picc line  

## 2011-10-07 NOTE — Telephone Encounter (Signed)
I spoke with [name] and is aware ok to d/c picc line. She voiced her understanding and nothing further was needed

## 2011-10-07 NOTE — Telephone Encounter (Signed)
Spoke with Annice Pih and [facility] and notified of recs per [facility]. She verbalized understanding, and states that the pt is currently doing well and does not need further abx. She states the reason she is calling pulm is b/c Arlyce Dice advised that it was ok to d/c picc line, but advised her to check with [facility] to be sure. She states that the pt has not had to use the picc line since Dec. Please advise, thanks!

## 2011-10-12 ENCOUNTER — Encounter: Payer: Self-pay | Admitting: Gastroenterology

## 2011-10-13 ENCOUNTER — Telehealth: Payer: Self-pay | Admitting: Pulmonary Disease

## 2011-10-13 NOTE — Telephone Encounter (Signed)
LMTCBx1.Pike Scantlebury, CMA  

## 2011-10-14 NOTE — Telephone Encounter (Signed)
Called spoke with Annice Pih, informed her of KC's recs.  Annice Pih verbalized her understanding and reported that GI has not been contacted regarding this but that she will contact Dr Marzetta Board office.  Nothing further needed at this time.  Will sign off.

## 2011-10-14 NOTE — Telephone Encounter (Signed)
I am ok with this, but I am very concerned about his biliary disease and what he can and cannot tolerate.  I would ask they ask GI if this is ok.  If GI has already cleared any type of feeding, ok to send nutritional referral.  If GI hasn't been involved, they need to see what type of nutrition he can have.

## 2011-10-14 NOTE — Telephone Encounter (Signed)
I spoke with Annice Pih and she states the family is requesting a nutrition evaluation. Please advise KC thanks

## 2011-10-19 ENCOUNTER — Telehealth: Payer: Self-pay | Admitting: Gastroenterology

## 2011-10-19 NOTE — Telephone Encounter (Signed)
I spoke with the patient's wife who indicated she was concerned that her cholecystostomy tube occasionally drained some debris and at the bile has turned darker at times. He's generally been pain-free except for one episode over the weekend. Drainage is good. She inquired whether he needs to be seen in the office and I stated that I would keep his office visits to a minimum unless she's having increasing frequency of abdominal pain or if drainage through the tube stops.

## 2011-10-19 NOTE — Telephone Encounter (Signed)
Dr. Arlyce Dice, Mrs. Rewerts is requesting a call back from you. She has some questions about her husband and would like to speak with you directly.

## 2011-10-26 ENCOUNTER — Telehealth: Payer: Self-pay | Admitting: Pulmonary Disease

## 2011-10-26 NOTE — Telephone Encounter (Signed)
[name] with [facility] aware to set vent setting to 18 and will send a form over with [place] and have KC sign this.  Spoke with [name] , caregiver in the home, and she is requesting a new RX for MSIR. She is aware this will not be handled tonight and they will need to pick this prescription up to take this to the pharmacy. LC,. pls advise. Allergies  Allergen Reactions  . Amitriptyline Hcl     Elavil  . Cephalosporins   . Glutathione   . Jevity   . Latex   . Levofloxacin     Levaquin  . Sulfamethoxazole W/Trimethoprim     Septra  . Cefdinir Rash  . Dicyclomine Hcl Rash

## 2011-10-26 NOTE — Telephone Encounter (Signed)
Called and spoke with [facility] nurse, [name].  He states pt has looked like he is "air hungry" today despite sats in the 90s.  [name] stated they increased pt's vent settings to 18 and he is "looking much better."  [name] is requesting an order from [facility] to keep setting at 57. [name] did state they changed pt's trach today and noted a green mucus plug.  Denies fever.  Will forward message to [facility] for his recs. Please advise.  Thanks!  Allergies  Allergen Reactions  . Amitriptyline Hcl     Elavil  . Cephalosporins   . Glutathione   . Jevity   . Latex   . Levofloxacin     Levaquin  . Sulfamethoxazole W/Trimethoprim     Septra  . Cefdinir Rash  . Dicyclomine Hcl Rash

## 2011-10-26 NOTE — Telephone Encounter (Signed)
Let dave know ok to keep vent rate on 18.

## 2011-10-26 NOTE — Telephone Encounter (Signed)
Please find out what his tidal volume is.  Really, find out what all of his current vent settings are.

## 2011-10-26 NOTE — Telephone Encounter (Signed)
Spoke with [name] and verified vent settings that were given to our receptionsist. He also added peep setting is 5. KC, pls advise.

## 2011-10-26 NOTE — Telephone Encounter (Signed)
[name], [facility] nurse called [name] back.  Tidal Volume is 600 & current vent setting are 14 breaths per minute, O2 2.5 meters per minute.  [name] asked to be reached on his cell, [phone].    [name]

## 2011-10-27 NOTE — Telephone Encounter (Signed)
I spoke with pt caregiver at home and advised.Carron Curie, CMA

## 2011-10-27 NOTE — Telephone Encounter (Signed)
Let them know that I did not start him on this medication to my knowledge.  I typically only use this medication when pt's are on hospice with palliative care.  I do not use this for treatment of pain.

## 2011-10-28 ENCOUNTER — Other Ambulatory Visit: Payer: Self-pay | Admitting: Gastroenterology

## 2011-11-16 ENCOUNTER — Telehealth: Payer: Self-pay | Admitting: Gastroenterology

## 2011-11-16 NOTE — Telephone Encounter (Signed)
Nurse states that [name] has passed 7 small stones through his Chole tube. States site looks the same, little bit of bloody drainage at the site. They are flushing the tube BID with saline. Nurse wants to know if they need to do anything different. Please advise.

## 2011-11-17 NOTE — Telephone Encounter (Signed)
Spoke with nurse and she is aware.

## 2011-11-17 NOTE — Telephone Encounter (Signed)
Nothing further to do.

## 2011-12-02 ENCOUNTER — Telehealth: Payer: Self-pay | Admitting: Pulmonary Disease

## 2011-12-02 MED ORDER — ALBUTEROL SULFATE (2.5 MG/3ML) 0.083% IN NEBU: INHALATION_SOLUTION | RESPIRATORY_TRACT | Status: DC

## 2011-12-02 NOTE — Telephone Encounter (Signed)
I spoke with vicki and she states pt needs new a;buterol [facility] [place]. I advised will send rx and nothing further was needed

## 2011-12-03 ENCOUNTER — Other Ambulatory Visit: Payer: Self-pay | Admitting: *Deleted

## 2011-12-03 MED ORDER — ALBUTEROL SULFATE (2.5 MG/3ML) 0.083% IN NEBU: INHALATION_SOLUTION | RESPIRATORY_TRACT | Status: DC

## 2011-12-09 ENCOUNTER — Ambulatory Visit (INDEPENDENT_AMBULATORY_CARE_PROVIDER_SITE_OTHER): Payer: Managed Care, Other (non HMO) | Admitting: Pulmonary Disease

## 2011-12-09 ENCOUNTER — Encounter: Payer: Self-pay | Admitting: Pulmonary Disease

## 2011-12-09 VITALS — BP 124/86 | HR 106 | Temp 98.6°F | Ht 75.0 in | Wt 188.0 lb

## 2011-12-09 DIAGNOSIS — G1221 Amyotrophic lateral sclerosis: Secondary | ICD-10-CM

## 2011-12-09 DIAGNOSIS — J9610 Chronic respiratory failure, unspecified whether with hypoxia or hypercapnia: Secondary | ICD-10-CM

## 2011-12-09 NOTE — Assessment & Plan Note (Signed)
The patient is very stable from a pulmonary standpoint on his current vent settings.  He is getting aggressive pulmonary hygiene, and has not had a recent pulmonary infection.  He is getting appropriate trach changes.  I have asked the family and caregivers to continue with his current medications and treatment plans.

## 2011-12-09 NOTE — Progress Notes (Signed)
  Subjective:    Patient ID: [name], male    DOB: [birth year], 56 y.o.   MRN: [id number]  HPI Patient comes in today for followup of his chronic respiratory failure secondary to ALS.  He is doing very well his current vent settings, and is maintaining adequate saturations.  He has had some issues with mucous plugging on the end of the tracheostomy, but now is getting frequent trach changes and maintaining adequate airway humidification.  He has not had a significant pulmonary infection quite some time, and is receiving aggressive pulmonary toilet.  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   Review of Systems  Constitutional: Negative.  Negative for fever and unexpected weight change.  HENT: Positive for congestion. Negative for ear pain, nosebleeds, sore throat, rhinorrhea, sneezing, trouble swallowing, dental problem, postnasal drip and sinus pressure.   Eyes: Negative.  Negative for redness and itching.  Respiratory: Negative.  Negative for cough, chest tightness, shortness of breath and wheezing.   Cardiovascular: Negative.  Negative for palpitations and leg swelling.  Gastrointestinal: Negative.  Negative for nausea and vomiting.  Genitourinary: Negative.  Negative for dysuria.  Musculoskeletal: Negative.  Negative for joint swelling.  Skin: Negative.  Negative for rash.  Neurological: Negative.  Negative for headaches.  Hematological: Negative.  Does not bruise/bleed easily.  Psychiatric/Behavioral: Negative.  Negative for dysphoric mood. The patient is not nervous/anxious.        Objective:   Physical Exam Well-developed male in no acute distress Tracheostomy in place with clean site and very few secretions. Chest with rhonchi on the right, otherwise clear Cardiac exam is regular rate and rhythm Lower extremities with very little edema, no cyanosis Alert, unable to move any extremities.       Assessment & Plan:

## 2011-12-09 NOTE — Patient Instructions (Signed)
Ok to leave oxygen on 2 liters as long as saturations are staying 90% or greater. Always give neb treatment until medication is completely gone. Would decrease klonopine to once a day (probably at night) for 2 weeks, then stop entirely. No change in vent settings or pulmonary hygiene.   followup with me in 12mos, or call if having issues.

## 2011-12-10 ENCOUNTER — Telehealth: Payer: Self-pay | Admitting: Pulmonary Disease

## 2011-12-10 NOTE — Telephone Encounter (Signed)
[name] is calling to get some guidance on how KC would like them to handle the pt's O2 sats. Old orders said to keep sats above 92% and to titrate up to 4L to keep sats at that level or above and to call EMS if unable to do so. New instructions from 12/09/11 OV say 90% and nurse wants clarification. She is aware KC is out of the office until 12/15/11 and is fine with waiting until then for his recs. They will call EMS in the meantime if needed. KC, pls advise.

## 2011-12-15 ENCOUNTER — Other Ambulatory Visit (HOSPITAL_COMMUNITY): Payer: Self-pay | Admitting: Interventional Radiology

## 2011-12-15 ENCOUNTER — Telehealth: Payer: Self-pay | Admitting: Gastroenterology

## 2011-12-15 DIAGNOSIS — K819 Cholecystitis, unspecified: Secondary | ICD-10-CM

## 2011-12-15 DIAGNOSIS — K8020 Calculus of gallbladder without cholecystitis without obstruction: Secondary | ICD-10-CM

## 2011-12-15 NOTE — Telephone Encounter (Signed)
A sat of 90% or greater is fine.  Should use the lowest flow of oxygen in reason to keep sats greater than or equal to 90%.

## 2011-12-15 NOTE — Telephone Encounter (Signed)
Pts wife wanted to call and let Dr. Arlyce Dice know that Dr. Fredia Sorrow is going to place a new, larger tube in patient. Has had multiple stones pass through and current tube is leaking a little. Wife wanted to make Dr. Arlyce Dice aware.

## 2011-12-15 NOTE — Telephone Encounter (Signed)
ok 

## 2011-12-16 NOTE — Telephone Encounter (Signed)
I spoke with [name], nurse for the day shift, and advised of recs.Carron Curie, CMA

## 2011-12-17 ENCOUNTER — Encounter: Payer: Self-pay | Admitting: *Deleted

## 2011-12-17 NOTE — Telephone Encounter (Signed)
error 

## 2011-12-21 ENCOUNTER — Other Ambulatory Visit: Payer: Self-pay | Admitting: Radiology

## 2011-12-24 ENCOUNTER — Encounter (HOSPITAL_COMMUNITY): Payer: Self-pay

## 2011-12-24 ENCOUNTER — Ambulatory Visit (HOSPITAL_COMMUNITY)
Admission: RE | Admit: 2011-12-24 | Discharge: 2011-12-24 | Disposition: A | Discharge status: Home / Self Care | Payer: Medicare Other | Source: Ambulatory Visit | Attending: Interventional Radiology | Admitting: Interventional Radiology

## 2011-12-24 DIAGNOSIS — K8020 Calculus of gallbladder without cholecystitis without obstruction: Secondary | ICD-10-CM

## 2011-12-24 DIAGNOSIS — G1221 Amyotrophic lateral sclerosis: Secondary | ICD-10-CM | POA: Insufficient documentation

## 2011-12-24 DIAGNOSIS — K8010 Calculus of gallbladder with chronic cholecystitis without obstruction: Secondary | ICD-10-CM | POA: Insufficient documentation

## 2011-12-24 DIAGNOSIS — Z434 Encounter for attention to other artificial openings of digestive tract: Secondary | ICD-10-CM | POA: Insufficient documentation

## 2011-12-24 DIAGNOSIS — K819 Cholecystitis, unspecified: Secondary | ICD-10-CM

## 2011-12-24 MED ORDER — IOHEXOL 300 MG/ML  SOLN: 10.0000 mL | Freq: Once | INTRAMUSCULAR | Status: AC | PRN

## 2011-12-24 MED ORDER — LIDOCAINE HCL 1 % IJ SOLN
INTRAMUSCULAR | Status: AC
  Filled 2011-12-24: qty 20

## 2011-12-24 MED ORDER — CIPROFLOXACIN IN D5W 400 MG/200ML IV SOLN
INTRAVENOUS | Status: AC
  Filled 2011-12-24: qty 200

## 2011-12-24 MED ORDER — CIPROFLOXACIN IN D5W 400 MG/200ML IV SOLN
400.0000 mg | Freq: Once | INTRAVENOUS | Status: AC
  Administered 2011-12-24: 400 mg via INTRAVENOUS

## 2011-12-24 MED ORDER — VANCOMYCIN HCL IN DEXTROSE 1-5 GM/200ML-% IV SOLN
INTRAVENOUS | Status: AC
  Filled 2011-12-24: qty 200

## 2011-12-24 MED ORDER — SODIUM CHLORIDE 0.9 % IV SOLN: Freq: Once | INTRAVENOUS | Status: DC

## 2011-12-24 NOTE — H&P (Signed)
Chief Complaint: Hx of perc chole drain Referring Physician:Kaplan HPI: [name] is an 56 y.o. male who has ALS and chronic resp failure with trach and vent dependent. He has been noticing gallstone coming out around his existing chole drain possible indicating that the tract is enlarged. He is scheduled for cholangiogram with poss upsize exchange of chole drain/tube. PMHx reviewed  Past Medical History:  Past Medical History  Diagnosis Date  . ALS (amyotrophic lateral sclerosis)     vent dependent  . Panic attack   . Ventilator dependent     Past Surgical History:  Past Surgical History  Procedure Date  . Pic line     Family History: No family history on file.  Social History:  reports that he has never smoked. He has never used smokeless tobacco. He reports that he does not drink alcohol or use illicit drugs.  Allergies:  Allergies  Allergen Reactions  . Amitriptyline Hcl     Elavil  . Cephalosporins   . Compleat   . Glutathione   . Latex   . Levofloxacin     Levaquin  . Sulfamethoxazole W-Trimethoprim     Septra  . Cefdinir Rash  . Dicyclomine Hcl Rash    Medications: acetaminophen (TYLENOL) 500 MG tablet (Taking) Sig: as needed. No more than 6 tabs in a 24 hours period Class: Historical Med albuterol (PROVENTIL) (2.5 MG/3ML) 0.083% nebulizer solution (Taking) 1080 mL 3 12/03/2011 11/29/2012 Sig: Use 1 vial every 4 hours. Dx 161.09 aluminum hydroxide-magnesium carbonate (GAVISCON) 31.7-137 MG/5ML suspension (Taking) Sig: 89ml/tsp. Via g-tube up to four times per day as needed for stomach upset or gas. Give 2 to 4 tsp (10-26ml) Class: Historical Med Number of times this order has been changed since signing: 1 [address] bacitracin-polymyxin b (POLYSPORIN) ointment (Taking) Sig - Route: Apply topically daily. To G tube - Topical Class: Historical Med Number of times this order has been changed since signing: 2 Order [address] Calcium & Magnesium Carbonates  (MYLANTA PO) (Taking) Sig: Give per package directions as needed for indigestion. Class: Historical Med carbamide peroxide (DEBROX) 6.5 % otic solution (Taking) Sig: 2-3 drops. In affected ear as needed for wax removal Class: Historical Med Number of times this order has been changed since signing: 1 Order [address] Cetirizine HCl (ZYRTEC CHILDRENS HIVES RELIEF) 5 MG/5ML SYRP (Taking) Sig: Give 5mg  (ml) twice daily. May substitue Clarinex 5mg  once daily. Class: Historical Med Number of times this order has been changed since signing: 1 Order [address] clonazePAM (KLONOPIN) 0.5 MG tablet (Taking) Sig - Route: Take 0.5 mg by mouth 2 (two) times daily as needed. - Oral Class: Historical Med cyanocobalamin (,VITAMIN B-12,) 1000 MCG/ML injection (Taking) Sig - Route: Inject 1,000 mcg into the muscle 2 (two) times a week. Mondays and Thursdays - Intramuscular Class: Historical Med Number of times this order has been changed since signing: 1 Order [address] desloratadine (CLARINEX) 0.5 MG/ML syrup (Taking) Sig: Give 5mg  (10mL) once daily via G-tube Class: Historical Med Dextromethorphan Polistirex (DELSYM PO) (Taking) Sig - Route: Take by mouth. Give 2 tsp (10ml) every 12 hours as needed for congestion. - Oral Class: Historical Med diphenhydrAMINE (BENADRYL) 12.5 MG/5ML elixir (Taking) Sig: Give 25mg  (10mL) every 4 hours as needed for itching Class: Historical Med Number of times this order has been changed since signing: 1 Order [address] Fish Oil OIL (Taking) Sig: 5mL per G-tube three times daily  Class: Historical Med HYDROcodone-acetaminophen (VICODIN) 5-500 MG per  tablet (Taking) 60 tablet 3 08/11/2011 Sig: Take 1-2 every 4 hours for pain Class: Phone In ipratropium (ATROVENT) 0.02 % nebulizer solution (Taking) 1125 mL 5 12/17/2010 Sig: USE 1 AMPULE 4 TIMES A DAY Ketorolac Tromethamine (ACUVAIL) 0.45 % SOLN (Taking) Sig - Route: Place 1 drop into both eyes 2 (two) times daily. - Both Eyes Class: Historical Med  lansoprazole (PREVACID) 30 MG capsule (Taking) 60 capsule 3 10/28/2011 Sig: TAKE VIA G-TUBE TWICE DAILY AS DIRECTED. lidocaine (XYLOCAINE) 2 % solution (Taking) Sig: Applied topically to trach stoma site as needed Class: Historical Med LORazepam (ATIVAN) 0.5 MG tablet (Taking) 60 tablet 5 08/07/2011 Sig - Route: Take 1 tablet (0.5 mg total) by mouth every 4 (four) hours as needed. For anxiety - Oral Class: Phone In LORazepam (ATIVAN) 1 MG tablet (Taking) 60 tablet 3 08/11/2011 Sig: Take 1-2 tablets every 4 hours prn Class: Print magnesium hydroxide (MILK OF MAGNESIA) 400 MG/5ML suspension (Taking) Sig - Route: Take by mouth daily as needed. - Oral Class: Historical Med morphine (MSIR) 15 MG tablet (Taking) Sig - Route: Take 15 mg by mouth every 6 (six) hours as needed. For pain - Oral Class: Historical Med Number of times this order has been changed since signing: 1 Order [address] Multiple Vitamins-Minerals (MULTIVITAMIN & MINERAL) LIQD (Taking) Sig: Take 2 tablespoons by G-tube daily Class: Historical Med Noni, [place] citrifolia, (NONI JUICE PO) (Taking) Sig - Route: Take 30 mLs by mouth 2 (two) times daily. - Oral Class: Historical Med Nutritional Supplements (PULMOCARE) LIQD (Taking) Sig - Route: Take 1 Can by mouth as directed. 1 can of pulmocare mixed with 3 scoops of beneprotein - Oral Class: Historical Med Number of times this order has been changed since signing: 2 Order [address] nystatin (MYCOSTATIN) 100000 UNIT/ML suspension (Taking) Sig: Swab mouth with 1 teaspoon four times daily as needed for mouth sores Class: Historical Med Polyethyl Glycol-Propyl Glycol (SYSTANE) 0.4-0.3 % SOLN (Taking) Sig: 1 drop every 2 (two) hours as needed. For dry eyes Class: Historical Med Number of times this order has been changed since signing: 3 Order [address] polyethylene glycol powder (GLYCOLAX/MIRALAX) powder (Taking) 3689 g 0 05/06/2011 Sig: TAKE 17 GM. IN 8 OZ. OF JUICE OR WATER. MAY USE AS MANY TIMES AS  NEEDED FOR CONSTIPATION. temazepam (RESTORIL) 30 MG capsule (Taking) Sig - Route: Take 30 mg by mouth at bedtime as needed. For sleep - Oral Class: Historical Med Number of times this order has been changed since signing: 2 Order [address] venlafaxine (EFFEXOR) 75 MG tablet (Taking) Sig - Route: Take 75 mg by mouth 2 (two) times daily. Mixed in 80 ml of pulmocare - Oral Class: Historical Med Number of times this order has been changed since signing: 3 Order [address] vitamin B-12 (CYANOCOBALAMIN) 500 MCG tablet (Taking) Sig: 2 tabs via G-tube daily Class: Historical Med Wheat Dextrin (BENEFIBER PO) (Taking) Sig - Route: Take 15 mLs by mouth daily. In 120 ml of water - Oral Class: Historical Med Adhesive Bandages (PROXI-STRIPS) MISC (Discontinued) 30 each 10 09/29/2011 12/09/2011 Sig - Route: 2 applicators by Does not apply route 1 day or 1 dose. - Does not apply Reason for Discontinue: Error baclofen (LIORESAL) 10 MG tablet (Discontinued) 12/09/2011 Sig - Route: Take 10 mg by mouth 3 (three) times daily. Via G-tube - Oral Class: Historical Med Reason for Discontinue: Error lansoprazole (PREVACID) 3 mg/ml SUSP oral suspension (Discontinued) 12/09/2011 Sig: 5 ml via gastrostomy tube twice daily. Class: Historical Med Reason  for Discontinue: Error Number of times this order has been changed since signing: 1 Order [address] polyethylene glycol (MIRALAX / GLYCOLAX) packet (Discontinued) 12/09/2011 Sig - Route: Take 17 g by mouth daily. - Oral Class: Historical Med Reason for Discontinue: Error   Please HPI for pertinent positives, otherwise complete 10 system ROS negative.  Physical Exam: There were no vitals taken for this visit. There is no height or weight on file to calculate BMI.   General Appearance:  Alert, cooperative, no distress, appears stated age  Head:  Normocephalic, without obvious abnormality, atraumatic  ENT: Unremarkable  Neck: Supple, symmetrical, trachea midline with well appearing  tracheostomy in place intact to vent.  Lungs:   Clear to auscultation bilaterally, no w/r/r, respirations unlabored without use of accessory muscles.  Chest Wall:  No tenderness or deformity  Heart:  Regular rate and rhythm, S1, S2 normal, no murmur, rub or gallop. Carotids 2+ without bruit.  Abdomen:   Soft, non-tender, non distended. RUQ chole drain.  Extremities: ALS muscle atrophy  Neurologic: Normal affect, no gross deficits.   No results found for this or any previous visit (from the past 48 hour(s)). No results found.  Assessment/Plan Perc chole drain with stones being seen around drain from tract. Will proceed with cholangiogram and possible exchange/upsize of drain. Discussed procedure with family/POA. Consent obtained.  Brayton El PA-C 12/24/2011, 12:10 PM

## 2011-12-24 NOTE — H&P (Signed)
For perc choly tube change today.

## 2012-01-12 ENCOUNTER — Other Ambulatory Visit: Payer: Self-pay | Admitting: Gastroenterology

## 2012-01-12 MED ORDER — SILVER NITRATE-POT NITRATE 75-25 % EX MISC: CUTANEOUS | Status: DC

## 2012-01-18 ENCOUNTER — Other Ambulatory Visit: Payer: Self-pay | Admitting: Gastroenterology

## 2012-01-18 MED ORDER — SILVER NITRATE-POT NITRATE 75-25 % EX MISC: CUTANEOUS | Status: AC | PRN

## 2012-02-02 ENCOUNTER — Other Ambulatory Visit (HOSPITAL_COMMUNITY): Payer: Self-pay | Admitting: Interventional Radiology

## 2012-02-02 DIAGNOSIS — K819 Cholecystitis, unspecified: Secondary | ICD-10-CM

## 2012-02-03 ENCOUNTER — Other Ambulatory Visit: Payer: Self-pay | Admitting: Gastroenterology

## 2012-02-10 ENCOUNTER — Other Ambulatory Visit: Payer: Self-pay | Admitting: Gastroenterology

## 2012-02-19 ENCOUNTER — Encounter (HOSPITAL_COMMUNITY): Payer: Self-pay | Admitting: Pharmacy Technician

## 2012-02-29 ENCOUNTER — Other Ambulatory Visit (HOSPITAL_COMMUNITY): Payer: Managed Care, Other (non HMO)

## 2012-02-29 ENCOUNTER — Ambulatory Visit (HOSPITAL_COMMUNITY)
Admission: RE | Admit: 2012-02-29 | Discharge: 2012-02-29 | Disposition: A | Discharge status: Home / Self Care | Payer: Managed Care, Other (non HMO) | Source: Ambulatory Visit | Attending: Interventional Radiology | Admitting: Interventional Radiology

## 2012-02-29 DIAGNOSIS — Z434 Encounter for attention to other artificial openings of digestive tract: Secondary | ICD-10-CM | POA: Insufficient documentation

## 2012-02-29 DIAGNOSIS — K8010 Calculus of gallbladder with chronic cholecystitis without obstruction: Secondary | ICD-10-CM | POA: Insufficient documentation

## 2012-02-29 DIAGNOSIS — G1221 Amyotrophic lateral sclerosis: Secondary | ICD-10-CM | POA: Insufficient documentation

## 2012-02-29 DIAGNOSIS — K819 Cholecystitis, unspecified: Secondary | ICD-10-CM

## 2012-02-29 MED ORDER — IOHEXOL 300 MG/ML  SOLN
50.0000 mL | Freq: Once | INTRAMUSCULAR | Status: AC | PRN
  Administered 2012-02-29: 10 mL via INTRAVENOUS

## 2012-02-29 NOTE — Procedures (Signed)
Procedure:  Cholecystostomy tube change Findings:  New 10 Fr cath placed in gallbladder.  Connected to new gravity bag.

## 2012-03-02 ENCOUNTER — Telehealth (HOSPITAL_COMMUNITY): Payer: Self-pay | Admitting: *Deleted

## 2012-03-02 NOTE — Telephone Encounter (Signed)
Post procedure follow up call.  Spoke with pt's home health nurse and his wife Zella Ball.  Both say pt is doing well without any adverse effects or complications.  Questions denied.  Will call back with any concers

## 2012-03-04 ENCOUNTER — Other Ambulatory Visit: Payer: Self-pay | Admitting: Pulmonary Disease

## 2012-03-10 ENCOUNTER — Telehealth: Payer: Self-pay | Admitting: Gastroenterology

## 2012-03-10 NOTE — Telephone Encounter (Signed)
Pts tube came out, think there is a hole in the bulb at the end. [employer] 24 Fr. Replacement obtained from [facility] endo. Pts wife to pick the tube up from our office.

## 2012-04-21 ENCOUNTER — Encounter (HOSPITAL_COMMUNITY): Payer: Self-pay | Admitting: Pharmacy Technician

## 2012-05-03 ENCOUNTER — Encounter (HOSPITAL_COMMUNITY): Payer: Self-pay | Admitting: *Deleted

## 2012-05-03 NOTE — Progress Notes (Signed)
Pt doesn't have a cardiologist  Denies ever having an echo/stress test/heart cath  Medical Md is Dr.Elsner(special case and follows him at home)  CXR and EKG about 03/2011

## 2012-05-03 NOTE — Progress Notes (Signed)
Pt doesn't talk but does respond with eye movements;he does get up to chair but is total care;he has a Banker from [employer]

## 2012-05-03 NOTE — H&P (Signed)
Assessment  . Cerumen impaction   (380.4) . Amyotrophic lateral sclerosis   (335.20) . Tracheostomy care requirement   (V55.0) Discussed  Overall stable. Had one episode where the tracheostomy needed to be changed urgently because of a mucous plug. Has had a couple of occasions where the cuff of the tracheostomy sprung a leak and had to be changed. Has had trouble removing the tracheostomy at times. Currently, the tracheostomy is in place and clear. Ear canals were cleaned of cerumen under the microscope. No need for any treatment changes related to the tracheostomy. Followup for ear cleaning in 6 months or sooner or p.r.n. Reason For Visit  Clean ears and check trach. Allergies  Amitriptyline HCl TABS Cefdinir SUSR Cephalosporins Dicyclomine HCl CAPS Glutathione TABS Jevity LIQD Latex Levofloxacin TABS Sulfamethoxazole-Trimethoprim TABS. Current Meds  Albuterol Sulfate (2.5 MG/3ML) 0.083% Inhalation Nebulization Solution;USE 1 NEBULE EVERY 4 HOURS; RPT Cyanocobalamin 1000 MCG/ML Injection Solution;INJECT 1CC IM 2 X'S A WEEK; RPT LORazepam 0.5 MG Oral Tablet;; RPT Temazepam 30 MG Oral Capsule;TAKE ONE CAPSULE BY MOUTH AT BEDTIME AS NEEDED FOR SLEEP; RPT Morphine Sulfate 15 MG Oral Tablet;CRUSH 1 TABLET AND GIVE VIA GTUBE UP TO EVERY 6 HOURS AS NEEDED FOR PA; RPT ClonazePAM 0.5 MG Oral Tablet;TAKE 1 TABLET BY N-G TUBE TWICE DAILY; RPT Ipratropium Bromide 0.02 % Inhalation Solution;USE 1 AMPULE 4 TIMES A DAY; RPT Hydrocodone-Acetaminophen 5-500 MG Oral Tablet;TAKE 1 TO 2 TABLETS BY MOUTH EVERY 6 HOURS AS NEEDED FOR PAIN **NEED*O; RPT Debrox SOLN;; RPT Gaviscon SUSP;; RPT LORazepam TABS;; RPT Mylanta CHEW;; RPT Nystatin SUSP;; RPT Polysporin OINT;; RPT Temazepam CAPS;; RPT Fish Oil CAPS;; RPT BD Luer-Lok Syringe 23G X 1" 3 ML Miscellaneous;USE AS DIRECTED; RPT Prevacid SoluTab 30 MG Oral Tablet Dispersible;; RPT Lidocaine HCl 2 % SOLN;; RPT Polyethylene Glycol 3350 Oral Powder;TAKE  17 GM. IN 8 OZ. OF JUICE OR WATER. MAY USE AS MANY TIMES AS NEEDE; RPT Venlafaxine HCl - 75 MG Oral Tablet;TAKE 1 TABLET IN TUBE 5 TIMES DAILY; RPT ZyrTEC Childrens Allergy SYRP;; RPT Lansoprazole 30 MG Oral Capsule Delayed Release;; RPT Dextromethorphan Polistirex ER LQCR;; RPT. Active Problems  Amyotrophic Lateral Sclerosis (335.20) Cerumen Impaction (380.4) Tracheostomy Care Requirement (V55.0). PSH  Cholecystectomy Oral Surgery Tooth Extraction Tonsillectomy Tracheostomy 2005 (V44.0) Tracheostomy Care Requirement (V55.0). Family Hx  Family history of Cancer Family history of Diabetes Mellitus Family history of Hearing Loss Paternal history of Hypertension Family history of Stroke Syndrome Family history of Thromboembolic Disease. Personal Hx  Never A Smoker. Vital Signs   Recorded by [facility] on 01 Dec 2011 11:13 AM BP:110/60,  Weight Method: Unable to Obtain. Signature  Electronically signed by : [name]  M.D.; 12/01/2011 11:22 AM EST.

## 2012-05-03 NOTE — Progress Notes (Signed)
Orders requested from Dr.Rosen's nurse

## 2012-05-04 ENCOUNTER — Encounter (HOSPITAL_COMMUNITY)
Admission: RE | Disposition: A | Discharge status: Home / Self Care | Payer: Self-pay | Source: Ambulatory Visit | Attending: Otolaryngology

## 2012-05-04 ENCOUNTER — Encounter (HOSPITAL_COMMUNITY): Payer: Self-pay | Admitting: *Deleted

## 2012-05-04 ENCOUNTER — Ambulatory Visit (HOSPITAL_COMMUNITY): Payer: Medicare Other

## 2012-05-04 ENCOUNTER — Encounter (HOSPITAL_COMMUNITY): Payer: Self-pay | Admitting: Anesthesiology

## 2012-05-04 ENCOUNTER — Ambulatory Visit (HOSPITAL_COMMUNITY): Payer: Medicare Other | Admitting: Anesthesiology

## 2012-05-04 ENCOUNTER — Ambulatory Visit (HOSPITAL_COMMUNITY)
Admission: RE | Admit: 2012-05-04 | Discharge: 2012-05-04 | Disposition: A | Discharge status: Home / Self Care | Payer: Medicare Other | Source: Ambulatory Visit | Attending: Otolaryngology | Admitting: Otolaryngology

## 2012-05-04 DIAGNOSIS — G1221 Amyotrophic lateral sclerosis: Secondary | ICD-10-CM | POA: Insufficient documentation

## 2012-05-04 DIAGNOSIS — Z430 Encounter for attention to tracheostomy: Secondary | ICD-10-CM | POA: Insufficient documentation

## 2012-05-04 HISTORY — DX: Pneumonia, unspecified organism: J18.9

## 2012-05-04 HISTORY — DX: Other mechanical complication of other gastrointestinal prosthetic devices, implants and grafts, initial encounter: T85.598A

## 2012-05-04 HISTORY — DX: Unspecified urinary incontinence: R32

## 2012-05-04 HISTORY — DX: Personal history of other diseases of the digestive system: Z87.19

## 2012-05-04 HISTORY — DX: Major depressive disorder, single episode, unspecified: F32.9

## 2012-05-04 HISTORY — DX: Insomnia, unspecified: G47.00

## 2012-05-04 HISTORY — PX: TRACHEOSTOMY REVISION: SHX6133

## 2012-05-04 HISTORY — DX: Depression, unspecified: F32.A

## 2012-05-04 HISTORY — DX: Gastro-esophageal reflux disease without esophagitis: K21.9

## 2012-05-04 LAB — CBC
HCT: 31.2 % — ABNORMAL LOW (ref 39.0–52.0)
Hemoglobin: 9.9 g/dL — ABNORMAL LOW (ref 13.0–17.0)
RBC: 4.55 MIL/uL (ref 4.22–5.81)
WBC: 13 10*3/uL — ABNORMAL HIGH (ref 4.0–10.5)

## 2012-05-04 LAB — BASIC METABOLIC PANEL
BUN: 12 mg/dL (ref 6–23)
Chloride: 97 mEq/L (ref 96–112)
Glucose, Bld: 105 mg/dL — ABNORMAL HIGH (ref 70–99)
Potassium: 3.3 mEq/L — ABNORMAL LOW (ref 3.5–5.1)
Sodium: 132 mEq/L — ABNORMAL LOW (ref 135–145)

## 2012-05-04 LAB — SURGICAL PCR SCREEN: Staphylococcus aureus: NEGATIVE

## 2012-05-04 SURGERY — REVISION, STOMA, TRACHEA
Anesthesia: Monitor Anesthesia Care | Site: Neck | Wound class: Clean Contaminated

## 2012-05-04 MED ORDER — 0.9 % SODIUM CHLORIDE (POUR BTL) OPTIME
TOPICAL | Status: DC | PRN
  Administered 2012-05-04: 1000 mL

## 2012-05-04 MED ORDER — PROPOFOL 10 MG/ML IV BOLUS
INTRAVENOUS | Status: DC | PRN
  Administered 2012-05-04: 100 mg via INTRAVENOUS
  Administered 2012-05-04: 50 mg via INTRAVENOUS

## 2012-05-04 MED ORDER — LIDOCAINE-EPINEPHRINE 1 %-1:100000 IJ SOLN: INTRAMUSCULAR | Status: DC | PRN

## 2012-05-04 MED ORDER — OXYCODONE HCL 5 MG/5ML PO SOLN: 5.0000 mg | Freq: Once | ORAL | Status: DC | PRN

## 2012-05-04 MED ORDER — ONDANSETRON HCL 4 MG/2ML IJ SOLN: 4.0000 mg | Freq: Once | INTRAMUSCULAR | Status: DC | PRN

## 2012-05-04 MED ORDER — LACTATED RINGERS IV SOLN
INTRAVENOUS | Status: DC | PRN
  Administered 2012-05-04: 12:00:00 via INTRAVENOUS

## 2012-05-04 MED ORDER — MUPIROCIN 2 % EX OINT
TOPICAL_OINTMENT | Freq: Two times a day (BID) | CUTANEOUS | Status: DC
  Filled 2012-05-04: qty 22

## 2012-05-04 MED ORDER — LIDOCAINE-EPINEPHRINE 1 %-1:100000 IJ SOLN
INTRAMUSCULAR | Status: AC
  Filled 2012-05-04: qty 1

## 2012-05-04 MED ORDER — OXYCODONE HCL 5 MG PO TABS: 5.0000 mg | ORAL_TABLET | Freq: Once | ORAL | Status: DC | PRN

## 2012-05-04 MED ORDER — SODIUM CHLORIDE 0.9 % IV SOLN
INTRAVENOUS | Status: DC
  Administered 2012-05-04: 11:00:00 via INTRAVENOUS

## 2012-05-04 MED ORDER — HYDROMORPHONE HCL PF 1 MG/ML IJ SOLN: 0.2500 mg | INTRAMUSCULAR | Status: DC | PRN

## 2012-05-04 MED ORDER — MEPERIDINE HCL 25 MG/ML IJ SOLN: 6.2500 mg | INTRAMUSCULAR | Status: DC | PRN

## 2012-05-04 MED ORDER — MUPIROCIN 2 % EX OINT
TOPICAL_OINTMENT | CUTANEOUS | Status: AC
  Administered 2012-05-04: 09:00:00 via NASAL
  Filled 2012-05-04: qty 22

## 2012-05-04 SURGICAL SUPPLY — 38 items
BENZOIN TINCTURE PRP APPL 2/3 (GAUZE/BANDAGES/DRESSINGS) IMPLANT
BLADE SURG 11 STRL SS (BLADE) IMPLANT
BLADE SURG 15 STRL LF DISP TIS (BLADE) ×1 IMPLANT
BLADE SURG 15 STRL SS (BLADE) ×1
BLADE SURG ROTATE 9660 (MISCELLANEOUS) IMPLANT
CANISTER SUCTION 2500CC (MISCELLANEOUS) ×2 IMPLANT
CLEANER TIP ELECTROSURG 2X2 (MISCELLANEOUS) ×2 IMPLANT
CLOTH BEACON ORANGE TIMEOUT ST (SAFETY) ×2 IMPLANT
COVER SURGICAL LIGHT HANDLE (MISCELLANEOUS) ×2 IMPLANT
DECANTER SPIKE VIAL GLASS SM (MISCELLANEOUS) ×2 IMPLANT
ELECT COATED BLADE 2.86 ST (ELECTRODE) ×2 IMPLANT
ELECT REM PT RETURN 9FT ADLT (ELECTROSURGICAL) ×2
ELECTRODE REM PT RTRN 9FT ADLT (ELECTROSURGICAL) ×1 IMPLANT
GAUZE SPONGE 4X4 16PLY XRAY LF (GAUZE/BANDAGES/DRESSINGS) ×2 IMPLANT
GLOVE BIOGEL PI IND STRL 7.5 (GLOVE) ×2 IMPLANT
GLOVE BIOGEL PI INDICATOR 7.5 (GLOVE) ×2
GLOVE ECLIPSE 7.5 STRL STRAW (GLOVE) IMPLANT
GLOVE SURG SS PI 6.5 STRL IVOR (GLOVE) ×2 IMPLANT
GLOVE SURG SS PI 7.5 STRL IVOR (GLOVE) ×4 IMPLANT
GOWN STRL NON-REIN LRG LVL3 (GOWN DISPOSABLE) ×4 IMPLANT
KIT BASIN OR (CUSTOM PROCEDURE TRAY) ×2 IMPLANT
KIT ROOM TURNOVER OR (KITS) ×2 IMPLANT
NEEDLE HYPO 25GX1X1/2 BEV (NEEDLE) IMPLANT
NS IRRIG 1000ML POUR BTL (IV SOLUTION) ×2 IMPLANT
PACK EENT II TURBAN DRAPE (CUSTOM PROCEDURE TRAY) ×2 IMPLANT
PAD ARMBOARD 7.5X6 YLW CONV (MISCELLANEOUS) ×4 IMPLANT
PENCIL FOOT CONTROL (ELECTRODE) ×2 IMPLANT
SPONGE DRAIN TRACH 4X4 STRL 2S (GAUZE/BANDAGES/DRESSINGS) ×2 IMPLANT
SUT CHROMIC 2 0 SH (SUTURE) IMPLANT
SUT ETHILON 3 0 PS 1 (SUTURE) IMPLANT
SUT SILK 4 0 TIE 10X30 (SUTURE) ×2 IMPLANT
SUT SILK 4 0 TIES 17X18 (SUTURE) ×2 IMPLANT
SYR 20CC LL (SYRINGE) ×2 IMPLANT
SYR CONTROL 10ML LL (SYRINGE) IMPLANT
TOWEL OR 17X24 6PK STRL BLUE (TOWEL DISPOSABLE) ×2 IMPLANT
TOWEL OR 17X26 10 PK STRL BLUE (TOWEL DISPOSABLE) ×2 IMPLANT
TUBE CONNECTING 12X1/4 (SUCTIONS) ×2 IMPLANT
WATER STERILE IRR 1000ML POUR (IV SOLUTION) ×2 IMPLANT

## 2012-05-04 NOTE — Transfer of Care (Signed)
Immediate Anesthesia Transfer of Care Note  Patient: [name]  Procedure(s) Performed: Procedure(s) (LRB) with comments: TRACHEOSTOMY REVISION (N/A) - Tracheostomy Change   Patient Location: PACU  Anesthesia Type:MAC  Level of Consciousness: sedated  Airway & Oxygen Therapy: Patient Spontanous Breathing and Patient connected to T-piece oxygen  Post-op Assessment: Report given to PACU RN and Post -op Vital signs reviewed and stable  Post vital signs: Reviewed and stable  Complications: No apparent anesthesia complications

## 2012-05-04 NOTE — Anesthesia Postprocedure Evaluation (Signed)
Anesthesia Post Note  Patient: [name]  Procedure(s) Performed: Procedure(s) (LRB): TRACHEOSTOMY REVISION (N/A)  Anesthesia type: general  Patient location: PACU  Post pain: Pain level controlled  Post assessment: Patient's Cardiovascular Status Stable  Last Vitals:  Filed Vitals:   05/04/12 1313  BP:   Pulse: 88  Temp:   Resp: 18    Post vital signs: Reviewed and stable  Level of consciousness: sedated  Complications: No apparent anesthesia complications

## 2012-05-04 NOTE — Anesthesia Preprocedure Evaluation (Signed)
Anesthesia Evaluation  Patient identified by MRN, date of birth, ID band Patient awake    Reviewed: Allergy & Precautions, H&P , NPO status , Patient's Chart, lab work & pertinent test results  Airway      Comment: Trached on Vent at home Dental   Pulmonary          Cardiovascular     Neuro/Psych ALS on Vent    GI/Hepatic   Endo/Other    Renal/GU      Musculoskeletal   Abdominal   Peds  Hematology   Anesthesia Other Findings   Reproductive/Obstetrics                           Anesthesia Physical Anesthesia Plan  ASA: III  Anesthesia Plan: General   Post-op Pain Management:    Induction: Intravenous  Airway Management Planned: Tracheostomy  Additional Equipment:   Intra-op Plan:   Post-operative Plan: Post-operative intubation/ventilation  Informed Consent: I have reviewed the patients History and Physical, chart, labs and discussed the procedure including the risks, benefits and alternatives for the proposed anesthesia with the patient or authorized representative who has indicated his/her understanding and acceptance.     Plan Discussed with: CRNA and Surgeon  Anesthesia Plan Comments:         Anesthesia Quick Evaluation

## 2012-05-04 NOTE — Preoperative (Signed)
Beta Blockers   Reason not to administer Beta Blockers:Not Applicable 

## 2012-05-04 NOTE — Op Note (Signed)
OPERATIVE REPORT  DATE OF SURGERY: 05/04/2012  PATIENT:  [name],  56 y.o. male  PRE-OPERATIVE DIAGNOSIS:  tracheostomy   POST-OPERATIVE DIAGNOSIS:  * No post-op diagnosis entered *  PROCEDURE:  Procedure(s): TRACHEOSTOMY REVISION  SURGEON:  Susy Frizzle, MD  ASSISTANTS: none   ANESTHESIA:   general  EBL:  0 ml  DRAINS: none   LOCAL MEDICATIONS USED:  NONE  SPECIMEN:  No Specimen  COUNTS:  YES  PROCEDURE DETAILS: Patient was taken to the operating room and placed on the operating table in the supine position. Following induction of intravenous sedation using propofol, the neck was draped in a standard fashion. The #6 cuffed tracheostomy tube, standard, that was in place was deflated and removed with some difficulty. It seemed to catch a little bit at the stomal orifice. I was able to remove it however, and then inserted a #6 extra long again with some catching right at the stoma orifice. The past in easily and we were able to obtain good carbon dioxide readings. It was secured in place with a Velcro straps and a dressing was applied between the shield and the skin.   PATIENT DISPOSITION:  PACU - hemodynamically stable.

## 2012-05-05 ENCOUNTER — Encounter (HOSPITAL_COMMUNITY): Payer: Self-pay | Admitting: Otolaryngology

## 2012-06-27 ENCOUNTER — Other Ambulatory Visit: Payer: Self-pay | Admitting: Gastroenterology

## 2012-06-27 ENCOUNTER — Other Ambulatory Visit (HOSPITAL_COMMUNITY): Payer: Self-pay | Admitting: Interventional Radiology

## 2012-06-27 DIAGNOSIS — G1221 Amyotrophic lateral sclerosis: Secondary | ICD-10-CM

## 2012-06-27 MED ORDER — HYDROCODONE-ACETAMINOPHEN 5-500 MG PO TABS: 1.0000 | ORAL_TABLET | ORAL | Status: DC | PRN

## 2012-06-27 NOTE — Telephone Encounter (Signed)
Can we refill medication. Please advise

## 2012-06-27 NOTE — Telephone Encounter (Signed)
Prescription printed and faxed to pharmacy 

## 2012-06-27 NOTE — Telephone Encounter (Signed)
ok 

## 2012-07-04 ENCOUNTER — Telehealth: Payer: Self-pay | Admitting: Pulmonary Disease

## 2012-07-04 NOTE — Telephone Encounter (Signed)
Spoke with [name] She is requesting a letter from [name] stating that pt needs around the clock nursing care and why She would like this to be faxed to her at [phone] She is aware that [facility] is out of the office until 07/07/12 Please advise, thanks!

## 2012-07-08 NOTE — Telephone Encounter (Signed)
[name] called back today- states that [employer] Is "relentless" and continues to call her. Needs this asap and she is sorry to bother Dr. Shelle Iron. Call her @ [phone]. [name]

## 2012-07-11 NOTE — Telephone Encounter (Signed)
Letter dictated

## 2012-07-12 ENCOUNTER — Telehealth: Payer: Self-pay | Admitting: Pulmonary Disease

## 2012-07-12 NOTE — Telephone Encounter (Signed)
[name], I just dictated a note on this pt, and the transcription is a mess (format is not right).  I am not sure who to talk to, but I need this cleaned up and printed on [place] letterhead. Thanks.  Kind of need asap.

## 2012-07-12 NOTE — Telephone Encounter (Signed)
See phone note dated 07/04/12.

## 2012-07-12 NOTE — Telephone Encounter (Signed)
Letter received and KC has reviewed and signed. [name], Zella Ball, aware she may come by to pick up the original and I will fax a copy to her office as requested. [phone].. Will place a copy in [facility] scan folder as well.

## 2012-07-12 NOTE — Letter (Signed)
July 11, 2012    RE:  Gregory, DUSENBERY MRN:  [id number]  /  DOB:  1956/04/02  To Whom It May Concern:  I am writing on behalf of 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  [name]' condition requires aggressive nursing care for frequent turnings to prevent pneumonia and skin breakdown, and also to monitor his tenuous cardiopulmonary status while on mechanical ventilation.   I believe that he requires around the clock nursing for his intensive home care, and would ask that you give strong consideration to this request.   If I can provide any further information that may help with my request,  please feel free to call me.   Sincerely,     Barbaraann Share, MD,FCCP   [facility]  DD: 07/11/2012  DT: 07/12/2012  Job #: [id number]

## 2012-07-12 NOTE — Telephone Encounter (Signed)
[name] Zella Ball aware the letter has been dictated. She wants this faxed to her at [phone] and then we should contact her so she can come by to pick up the original.  I spoke with Synetta Fail in transcription and the letter has been sent to [facility] in a red inter-office envelope for his signature. Will forward msg to  Ashtyn so she is aware and can be looking for this.

## 2012-07-13 ENCOUNTER — Other Ambulatory Visit (HOSPITAL_COMMUNITY): Payer: Self-pay | Admitting: Interventional Radiology

## 2012-07-13 ENCOUNTER — Ambulatory Visit (HOSPITAL_COMMUNITY)
Admission: RE | Admit: 2012-07-13 | Discharge: 2012-07-13 | Disposition: A | Discharge status: Home / Self Care | Payer: Medicare Other | Source: Ambulatory Visit | Attending: Interventional Radiology | Admitting: Interventional Radiology

## 2012-07-13 ENCOUNTER — Encounter (HOSPITAL_COMMUNITY): Payer: Self-pay | Admitting: Pharmacy Technician

## 2012-07-13 DIAGNOSIS — Z430 Encounter for attention to tracheostomy: Secondary | ICD-10-CM | POA: Insufficient documentation

## 2012-07-13 DIAGNOSIS — Z438 Encounter for attention to other artificial openings: Secondary | ICD-10-CM | POA: Insufficient documentation

## 2012-07-13 DIAGNOSIS — G1221 Amyotrophic lateral sclerosis: Secondary | ICD-10-CM

## 2012-07-13 MED ORDER — IOHEXOL 300 MG/ML  SOLN
50.0000 mL | Freq: Once | INTRAMUSCULAR | Status: AC | PRN
  Administered 2012-07-13: 1 mL via INTRAVENOUS

## 2012-07-13 NOTE — Consult Note (Signed)
Patient was seen while in interventional radiology for tracheostomy change, as arranged in the past to coordinate with other procedure. At the request of the wife and the respiratory therapist, I removed the tracheostomy without difficulty and replaced with a standard cuffed #6. The cuff required a large amount of inflation to prevent airleak but was moving air well with typical pressures. No complications. We will replace this again as needed.

## 2012-07-15 ENCOUNTER — Telehealth: Payer: Self-pay | Admitting: *Deleted

## 2012-07-15 MED ORDER — HYDROCODONE-ACETAMINOPHEN 5-500 MG PO TABS: 1.0000 | ORAL_TABLET | ORAL | Status: DC | PRN

## 2012-07-15 NOTE — Telephone Encounter (Signed)
yes

## 2012-07-15 NOTE — Telephone Encounter (Signed)
PHARMACY CALLING, PT NEEDS A SCRIPT FOR HYDROCODONE-ACETAMINOPHEN 5/500 CAN I PRESCRIBE??

## 2012-07-15 NOTE — Telephone Encounter (Signed)
MED SENT TO PHARMACY

## 2012-07-18 NOTE — Telephone Encounter (Signed)
Has this been taken care of? I see the copy in [facility] scan folder...has the patient come to pick up original letter? If so, this message may be closed.   Lori please advise, thanks.

## 2012-07-18 NOTE — Telephone Encounter (Signed)
This was taken care of on 07/12/12 and nothing further is needed.

## 2012-07-27 ENCOUNTER — Other Ambulatory Visit: Payer: Self-pay | Admitting: Pulmonary Disease

## 2012-07-27 MED ORDER — ALBUTEROL SULFATE (2.5 MG/3ML) 0.083% IN NEBU: 2.5000 mg | INHALATION_SOLUTION | RESPIRATORY_TRACT | Status: DC

## 2012-10-06 ENCOUNTER — Telehealth: Payer: Self-pay | Admitting: Pulmonary Disease

## 2012-10-06 MED ORDER — CIPROFLOXACIN HCL 750 MG PO TABS: 750.0000 mg | ORAL_TABLET | Freq: Two times a day (BID) | ORAL | Status: DC

## 2012-10-06 NOTE — Telephone Encounter (Signed)
I spoke with spouse. She stated for the past few days pt has had a fever off and on 98-100.6, 02 sats on 2 liters have been 98-100%, he is getting up thick yellow secretions, chest is congested, hear cracking on right side. Pt is taking zytrec. Pt on vent per spouse. She texted Dr. Danielle Dess last night adn was advised it sounded more like pulmonary symptoms and needed to call [facility] for recs. Please advised thanks  Allergies  Allergen Reactions  . Amitriptyline Hcl     Elavil= unknown per care giver  . Cephalosporins     Unknown per care giver  . Compleat     Unknown per care giver  . Glutathione     Unknown per care giver  . Latex     Unknown per care giver  . Levofloxacin     Levaquin= unknown per care giver  . Sulfamethoxazole W-Trimethoprim     Septra= unknown per care giver  . Cefdinir Rash  . Dicyclomine Hcl Rash

## 2012-10-06 NOTE — Telephone Encounter (Signed)
Pt wife is aware that [facility] has been called in. Home Health Nurse was also given the verbal order so that she can administer.

## 2012-10-06 NOTE — Telephone Encounter (Signed)
Will send in abx to treat, and will cover pseudomonas Call in cipro 750mg  per tube bid for 10 days.

## 2012-10-10 IMAGING — CR DG CHEST 1V PORT
1 series · 1 of 1 positions shown · non-contrast
Comparison: Portable chest x-ray of 07/07/2009

CLINICAL DATA: Shortness of breath, on ventilator

PORTABLE CHEST - 1 VIEW

[view not recorded]
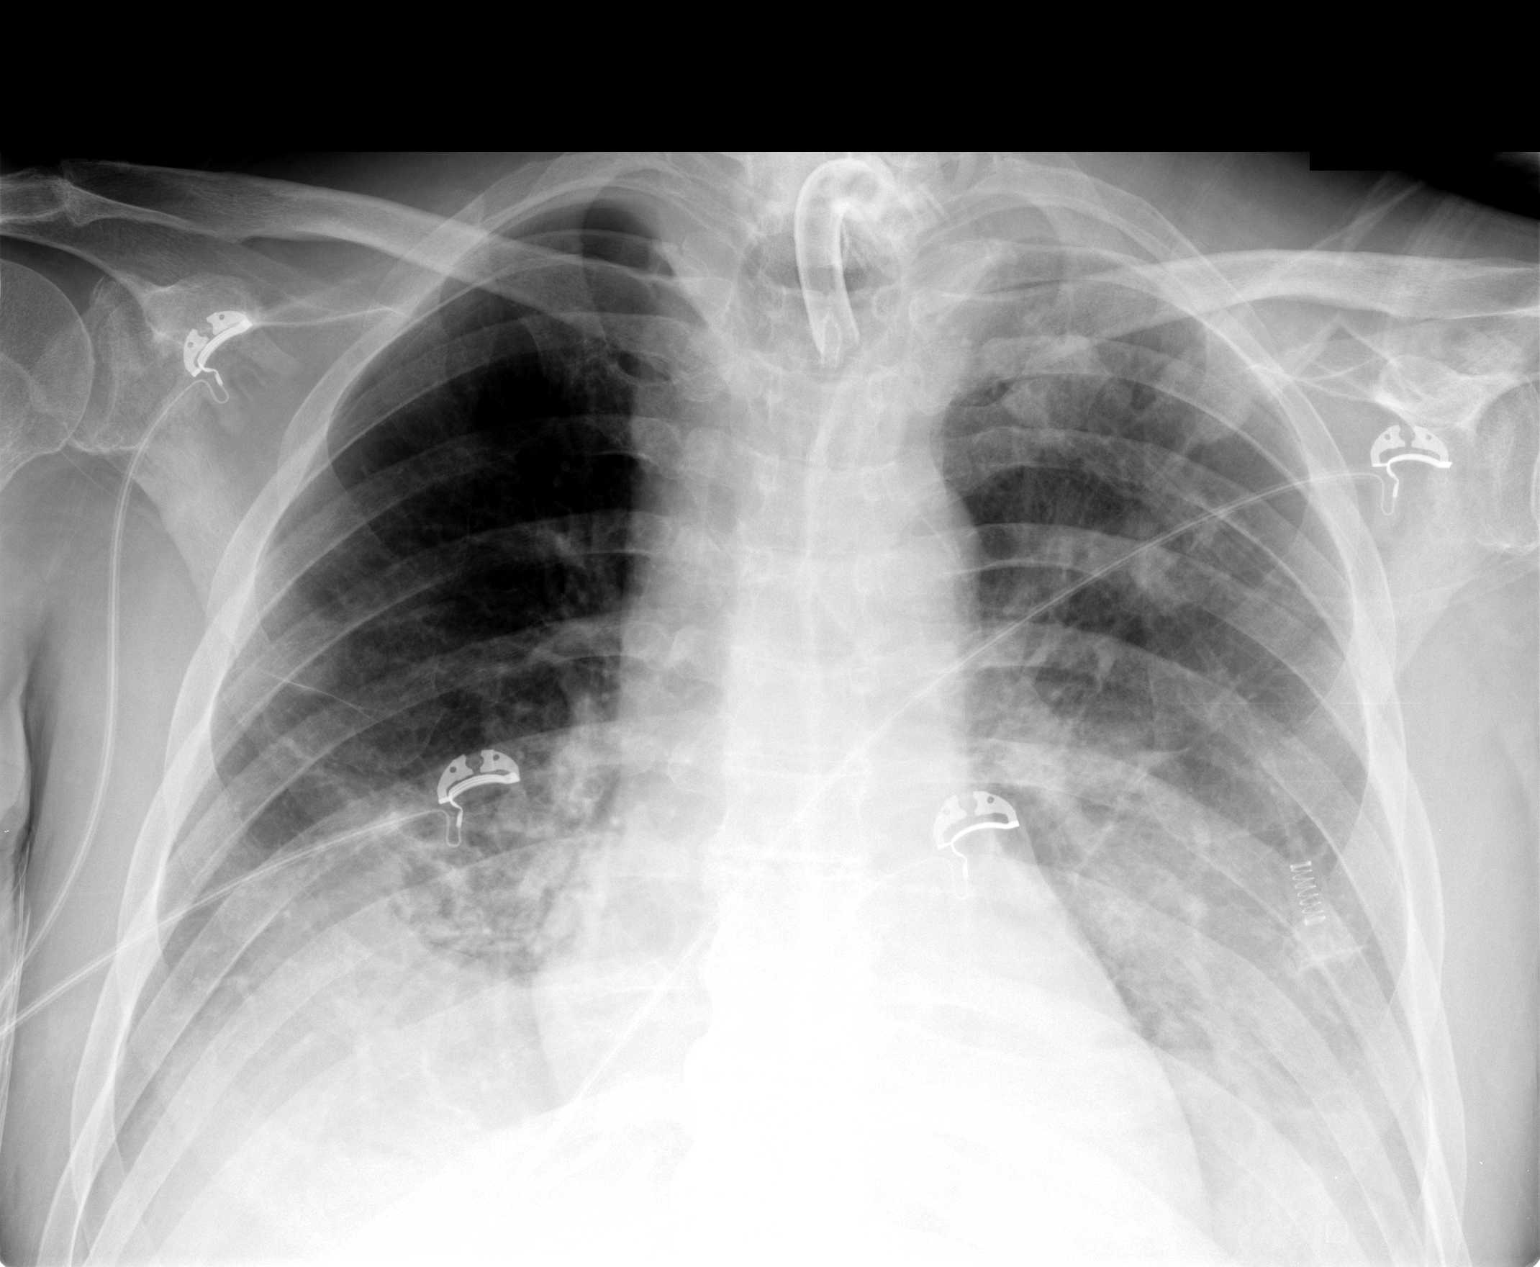

[1 of 1 positions shown; findings below may reference images not displayed]

FINDINGS: Aeration may have improved minimally.  Basilar opacities
remain most consistent with atelectasis and effusions left greater
than right.  No definite residual pneumothorax is seen, with a
small amount of fluid or scarring over the left apex.  There is
however a new small right apical pneumothorax present of
approximately 5%.  Tracheostomy remains.  Heart size is stable.
Left central venous line has been removed
IMPRESSION: 1.  Minimal improved aeration.
2.  New small right apical pneumothorax of approximately 5%.
3.  No definite left apical pneumothorax.  Probable basilar
atelectasis and effusions.

## 2012-10-12 ENCOUNTER — Encounter: Payer: Self-pay | Admitting: Gastroenterology

## 2012-10-28 ENCOUNTER — Other Ambulatory Visit: Payer: Self-pay | Admitting: Gastroenterology

## 2012-12-06 ENCOUNTER — Ambulatory Visit: Payer: Managed Care, Other (non HMO) | Admitting: Pulmonary Disease

## 2012-12-14 ENCOUNTER — Ambulatory Visit (INDEPENDENT_AMBULATORY_CARE_PROVIDER_SITE_OTHER): Payer: Medicare Other | Admitting: Gastroenterology

## 2012-12-14 ENCOUNTER — Encounter: Payer: Self-pay | Admitting: Gastroenterology

## 2012-12-14 ENCOUNTER — Ambulatory Visit: Payer: Managed Care, Other (non HMO) | Admitting: Pulmonary Disease

## 2012-12-14 ENCOUNTER — Encounter: Payer: Self-pay | Admitting: Pulmonary Disease

## 2012-12-14 ENCOUNTER — Other Ambulatory Visit (INDEPENDENT_AMBULATORY_CARE_PROVIDER_SITE_OTHER): Payer: Medicare Other

## 2012-12-14 ENCOUNTER — Ambulatory Visit (INDEPENDENT_AMBULATORY_CARE_PROVIDER_SITE_OTHER): Payer: Medicare Other | Admitting: Pulmonary Disease

## 2012-12-14 ENCOUNTER — Other Ambulatory Visit: Payer: Self-pay | Admitting: Pulmonary Disease

## 2012-12-14 VITALS — BP 102/68 | HR 99 | Temp 98.1°F | Ht 75.0 in | Wt 179.0 lb

## 2012-12-14 VITALS — BP 100/60 | HR 68 | Ht 75.0 in | Wt 179.0 lb

## 2012-12-14 DIAGNOSIS — G1221 Amyotrophic lateral sclerosis: Secondary | ICD-10-CM

## 2012-12-14 DIAGNOSIS — D509 Iron deficiency anemia, unspecified: Secondary | ICD-10-CM

## 2012-12-14 DIAGNOSIS — E538 Deficiency of other specified B group vitamins: Secondary | ICD-10-CM

## 2012-12-14 DIAGNOSIS — J9610 Chronic respiratory failure, unspecified whether with hypoxia or hypercapnia: Secondary | ICD-10-CM

## 2012-12-14 DIAGNOSIS — K5900 Constipation, unspecified: Secondary | ICD-10-CM

## 2012-12-14 DIAGNOSIS — R6889 Other general symptoms and signs: Secondary | ICD-10-CM

## 2012-12-14 DIAGNOSIS — K589 Irritable bowel syndrome without diarrhea: Secondary | ICD-10-CM

## 2012-12-14 LAB — CBC WITH DIFFERENTIAL/PLATELET
Eosinophils Absolute: 0.2 10*3/uL (ref 0.0–0.7)
Lymphocytes Relative: 7.3 % — ABNORMAL LOW (ref 12.0–46.0)
MCHC: 31.3 g/dL (ref 30.0–36.0)
MCV: 70.6 fl — ABNORMAL LOW (ref 78.0–100.0)
Monocytes Absolute: 0.6 10*3/uL (ref 0.1–1.0)
Neutrophils Relative %: 85.2 % — ABNORMAL HIGH (ref 43.0–77.0)
Platelets: 372 10*3/uL (ref 150.0–400.0)
WBC: 11.3 10*3/uL — ABNORMAL HIGH (ref 4.5–10.5)

## 2012-12-14 MED ORDER — CIPROFLOXACIN 500 MG/5ML (10%) PO SUSR: 750.0000 mg | Freq: Two times a day (BID) | ORAL | Status: DC

## 2012-12-14 NOTE — Patient Instructions (Addendum)
Would take the ipratropium out of the breathing treatments for the next one week to see if secretions thin out and are more easily suctioned. Will treat with cipro 750mg  one am and pm per tube for 7 days for possible bronchitis. Would like to check chest xray to make sure no pneumothorax or lobar collapse as a cause of the elevated peak pressures.  If pressures remain elevated, may have to consider decreasing rate or tidal volume to see if this helps.  Please call me in a week or so to give me feedback with how things are going (secretions, peak pressures, etc).  followup with me in one year if doing well.

## 2012-12-14 NOTE — Progress Notes (Signed)
History of Present Illness:  The patient has returned for annual checkup. A cholecystostomy tube was pulled and he he has had no problems subsequent to this. He is receiving gastrostomy tube feedings. There are no other active GI issues.    Review of Systems: Pertinent positive and negative review of systems were noted in the above HPI section. All other review of systems were otherwise negative.    Current Medications, Allergies, Past Medical History, Past Surgical History, Family History and Social History were reviewed in [facility] electronic medical record  Vital signs were reviewed in today's medical record. Physical Exam: General: Well developed , well nourished, no acute distress Skin: anicteric Head: Normocephalic and atraumatic Eyes:  sclerae anicteric, EOMI Ears: Normal auditory acuity Mouth: No deformity or lesions Lung: bibasilar rales, scattered rhonchi Heart: Regular rate and rhythm; no murmurs, rubs or bruits Abdomen: Soft, non tender and non distended. No masses, hepatosplenomegaly or hernias noted. Normal Bowel sounds. Gastrostomy tube site is clear. Rectal:deferred Musculoskeletal: Symmetrical with no gross deformities  Pulses:  Normal pulses noted Extremities: No clubbing, cyanosis, edema or deformities noted. There is some edema around his hands and wrist. Neurological: Alert oriented x 4, grossly nonfocal Psychological:  Alert and cooperative. Normal mood and affect

## 2012-12-14 NOTE — Assessment & Plan Note (Signed)
No further active problems related to this

## 2012-12-14 NOTE — Assessment & Plan Note (Signed)
The patient has been fairly stable on the ventilator, however his peak pressures have been increasing into the 50s and low 60s.  This is most likely secondary to mucous plugging, however we need to check a chest x-ray to make sure he does not have lobar collapse or possibly a pneumothorax.  He has had a pneumothorax in the past.  His minute ventilation is very high on the ventilator, but we did this a few years back because of severe air hunger.  We know that his pCO2 is below normal because of a falling serum bicarbonate level on bmets.  We'll try and improve his mucus plugging, and also rule out pneumothorax and atelectasis by chest x-ray.  Will also take the anti-cholinergic out of his nebulizers to see if this will help.  If he continues to have increased pressures, will need to begin to decrease his rate or tidal volume

## 2012-12-14 NOTE — Progress Notes (Signed)
  Subjective:    Patient ID: [name], male    DOB: 1956-04-09, 57 y.o.   MRN: [id number]  HPI The patient comes in today for followup of his chronic respiratory failure secondary to ALS.  He is ventilator dependent, and his nurse has noticed increased peak pressures on ventilator, as well as increased thickness and quantity of secretions.  It is difficult to suction, despite being on good pulmonary hygiene and the vibratory test.  He is on an anticholinergic, and perhaps this is contributing to his discuss with you.  His mucus is yellow at times and white others.  However, the quantity of mucus is clearly increased according to his nurse.  He has not had any fever or increased respiratory distress.  He is currently set on a tidal volume of 600 with a rate of 16, and his peak pressures are running in the 50s and low 60s.   Review of Systems  Constitutional: Negative for fever and unexpected weight change.  HENT: Positive for tinnitus. Negative for ear pain, nosebleeds, congestion, sore throat, rhinorrhea, sneezing, trouble swallowing, dental problem, postnasal drip and sinus pressure.   Eyes: Negative for redness and itching.  Respiratory: Negative for cough, chest tightness, shortness of breath and wheezing.        Increased secretions--lung volumes/PIP low to mid 50  Cardiovascular: Negative for palpitations and leg swelling.  Gastrointestinal: Negative for nausea and vomiting.  Genitourinary: Negative for dysuria.  Musculoskeletal: Negative for joint swelling.  Skin: Negative for rash.  Neurological: Negative for headaches.  Hematological: Does not bruise/bleed easily.  Psychiatric/Behavioral: Negative for dysphoric mood. The patient is not nervous/anxious.        Objective:   Physical Exam Thin male in no acute distress Nose without purulence or discharge noted Neck without lymphadenopathy or thyromegaly.  Trach in place with a clean site. Chest with fairly clear breath  sounds throughout, no active wheezing Cardiac exam regular rate and rhythm Abdomen soft nontender with good bowel sounds Lower extremities with mild edema, no cyanosis Alert, but unable to respond to questions.  Unable to move the extremities.  No eye blinking noted        Assessment & Plan:

## 2012-12-14 NOTE — Assessment & Plan Note (Signed)
Incidentally noted is a microcytic anemia.  Recommendations #1 check iron, TIBC, ferritin and repeat CBC

## 2012-12-14 NOTE — Patient Instructions (Addendum)
Go to the basement for labs today 

## 2012-12-15 ENCOUNTER — Telehealth: Payer: Self-pay | Admitting: Pulmonary Disease

## 2012-12-15 ENCOUNTER — Encounter (HOSPITAL_COMMUNITY): Payer: Self-pay | Admitting: Pharmacy Technician

## 2012-12-15 LAB — COMPREHENSIVE METABOLIC PANEL
ALT: 22 U/L (ref 0–53)
AST: 30 U/L (ref 0–37)
Albumin: 3.4 g/dL — ABNORMAL LOW (ref 3.5–5.2)
Alkaline Phosphatase: 103 U/L (ref 39–117)
Chloride: 106 mEq/L (ref 96–112)
Potassium: 4.3 mEq/L (ref 3.5–5.1)
Sodium: 137 mEq/L (ref 135–145)
Total Protein: 8.4 g/dL — ABNORMAL HIGH (ref 6.0–8.3)

## 2012-12-15 NOTE — Telephone Encounter (Signed)
This has been received and given to [facility] to review. Will wait on results and recs per [facility] and will call patient at that time.

## 2012-12-15 NOTE — Telephone Encounter (Signed)
Ashtyn, can you please advise if the results of portable cxr were received? Thanks!

## 2012-12-15 NOTE — H&P (Signed)
[name] is an 57 y.o. male.   Chief Complaint: ALS/Vent dependance HPI: Chronic tracheostomy. Having trouble recently with trach and unable to change it. Past Medical History  Diagnosis Date  . ALS (amyotrophic lateral sclerosis)     vent dependent  . Ventilator dependent   . Pneumonia     hx of   . Feeding tube blocked   . History of IBS     miralax daily  . History of constipation   . Urinary incontinence     condom cath  . GERD (gastroesophageal reflux disease)     Lansoprazole-given through feeding tube  . Depression     given via tube Klonopin bid  . Panic attack     ativan prn  . Insomnia     restoril nightly    Past Surgical History  Procedure Laterality Date  . Pic line    . Esophagogastroduodenoscopy    . Feeding tube placed    . Gallbladder tube in place    . Tonsillectomy    . Wisdom teeth extracted     . Colonoscopy    . Bronchoscopy      multipe  . Tracheostomy    . Tracheostomy revision  05/04/2012    Procedure: TRACHEOSTOMY REVISION;  Surgeon: Serena Colonel, MD;  Location: [facility] OR;  Service: ENT;  Laterality: N/A;  Tracheostomy Change     Family History  Problem Relation Age of Onset  . Colon cancer     Social History:  reports that he has never smoked. He has never used smokeless tobacco. He reports that he does not drink alcohol or use illicit drugs.  Allergies:  Allergies  Allergen Reactions  . Amitriptyline Hcl     Elavil= unknown per care giver  . Cephalosporins     Unknown per care giver  . Compleat     Unknown per care giver  . Glutathione     Unknown per care giver  . Latex     Unknown per care giver  . Levofloxacin     Levaquin= unknown per care giver  . Rilutek (Riluzole)     Hives//SOB  . Sulfamethoxazole W-Trimethoprim     Septra= unknown per care giver  . Cefdinir Rash  . Dicyclomine Hcl Rash    No prescriptions prior to admission    Results for orders placed in visit on 12/14/12 (from the past 48 hour(s))   CBC WITH DIFFERENTIAL     Status: Abnormal   Collection Time    12/14/12  4:45 PM      Result Value Range   WBC 11.3 (*) 4.5 - 10.5 K/uL   RBC 4.98  4.22 - 5.81 Mil/uL   Hemoglobin 11.0 (*) 13.0 - 17.0 g/dL   HCT 62.1 (*) 30.8 - 65.7 %   MCV 70.6 (*) 78.0 - 100.0 fl   MCHC 31.3  30.0 - 36.0 g/dL   RDW 84.6 (*) 96.2 - 95.2 %   Platelets 372.0  150.0 - 400.0 K/uL   Neutrophils Relative % 85.2 Repeated and verified X2. (*) 43.0 - 77.0 %   Lymphocytes Relative 7.3 Repeated and verified X2. (*) 12.0 - 46.0 %   Monocytes Relative 5.5  3.0 - 12.0 %   Eosinophils Relative 1.8  0.0 - 5.0 %   Basophils Relative 0.2  0.0 - 3.0 %   Neutro Abs 9.6 (*) 1.4 - 7.7 K/uL   Lymphs Abs 0.8  0.7 - 4.0 K/uL   Monocytes Absolute 0.6  0.1 - 1.0 K/uL   Eosinophils Absolute 0.2  0.0 - 0.7 K/uL   Basophils Absolute 0.0  0.0 - 0.1 K/uL  COMPREHENSIVE METABOLIC PANEL     Status: Abnormal   Collection Time    12/14/12  4:45 PM      Result Value Range   Sodium 137  135 - 145 mEq/L   Potassium 4.3  3.5 - 5.1 mEq/L   Chloride 106  96 - 112 mEq/L   CO2 21  19 - 32 mEq/L   Glucose, Bld 109 (*) 70 - 99 mg/dL   BUN 14  6 - 23 mg/dL   Creatinine, Ser 0.1 (*) 0.4 - 1.5 mg/dL   Total Bilirubin 0.4  0.3 - 1.2 mg/dL   Alkaline Phosphatase 103  39 - 117 U/L   AST 30  0 - 37 U/L   ALT 22  0 - 53 U/L   Total Protein 8.4 (*) 6.0 - 8.3 g/dL   Albumin 3.4 (*) 3.5 - 5.2 g/dL   Calcium 9.3  8.4 - 47.8 mg/dL   GFR 2956.21  >30.86 mL/min  FERRITIN     Status: None   Collection Time    12/14/12  4:45 PM      Result Value Range   Ferritin 39.2  22.0 - 322.0 ng/mL  IBC PANEL     Status: Abnormal   Collection Time    12/14/12  4:45 PM      Result Value Range   Iron 16 (*) 42 - 165 ug/dL   Transferrin 578.4  696.2 - 360.0 mg/dL   Saturation Ratios 4.7 (*) 20.0 - 50.0 %   No results found.  ROS: otherwise negative  There were no vitals taken for this visit.  PHYSICAL EXAM: Overall appearance:  Chronically ill,  on vent, stable Head:  Normocephalic, atraumatic. Ears: External ears normal. Nose: External nose is healthy in appearance. Internal nasal exam free of any lesions or obstruction. Oral Cavity/pharynx:  There are no mucosal lesions or masses identified. Hypopharynx/Larynx: no signs of any mucosal lesions or masses identified. Neuro:  Complete loss of motor function. Neck: No palpable neck masses. Trach in place.  Studies Reviewed: none    Assessment/Plan Change trach in OR with monitored care.  [name] 12/15/2012, 3:48 PM

## 2012-12-15 NOTE — Telephone Encounter (Signed)
Pt nurse Lynden Ang) aware of cxr results and recs per [facility]. Cipro 750 already sent to pt pharm 12/14/12. Vicky aware that [facility] out of office until 6/23 but that if anything needed in regards to pt care between now and then, they can call and speak with myself and we will have another physician tend to the message if need be.  Expressed understanding. Will call in 7 days for call report on progress.

## 2012-12-15 NOTE — Telephone Encounter (Signed)
Please let pt's nurse know that his cxr shows haziness in bottom of lungs, but he has had this before, due to atelectasis.  Without seeing it, no way to know if any worse or not.  Regardless, we are going to treat him with abx.  Nothing mentioned in report about ptx. Lets stick to the original plan.

## 2012-12-16 ENCOUNTER — Encounter (HOSPITAL_COMMUNITY): Payer: Self-pay | Admitting: Certified Registered"

## 2012-12-16 ENCOUNTER — Encounter (HOSPITAL_COMMUNITY)
Admission: RE | Disposition: A | Discharge status: Home / Self Care | Payer: Self-pay | Source: Ambulatory Visit | Attending: Otolaryngology

## 2012-12-16 ENCOUNTER — Encounter (HOSPITAL_COMMUNITY): Payer: Self-pay | Admitting: *Deleted

## 2012-12-16 ENCOUNTER — Ambulatory Visit (HOSPITAL_COMMUNITY): Payer: Medicare Other | Admitting: Certified Registered"

## 2012-12-16 ENCOUNTER — Ambulatory Visit (HOSPITAL_COMMUNITY)
Admission: RE | Admit: 2012-12-16 | Discharge: 2012-12-16 | Disposition: A | Discharge status: Home / Self Care | Payer: Medicare Other | Source: Ambulatory Visit | Attending: Otolaryngology | Admitting: Otolaryngology

## 2012-12-16 DIAGNOSIS — F410 Panic disorder [episodic paroxysmal anxiety] without agoraphobia: Secondary | ICD-10-CM | POA: Insufficient documentation

## 2012-12-16 DIAGNOSIS — F3289 Other specified depressive episodes: Secondary | ICD-10-CM | POA: Insufficient documentation

## 2012-12-16 DIAGNOSIS — Z881 Allergy status to other antibiotic agents status: Secondary | ICD-10-CM | POA: Insufficient documentation

## 2012-12-16 DIAGNOSIS — Z8701 Personal history of pneumonia (recurrent): Secondary | ICD-10-CM | POA: Insufficient documentation

## 2012-12-16 DIAGNOSIS — Z91040 Latex allergy status: Secondary | ICD-10-CM | POA: Insufficient documentation

## 2012-12-16 DIAGNOSIS — K219 Gastro-esophageal reflux disease without esophagitis: Secondary | ICD-10-CM | POA: Insufficient documentation

## 2012-12-16 DIAGNOSIS — F329 Major depressive disorder, single episode, unspecified: Secondary | ICD-10-CM | POA: Insufficient documentation

## 2012-12-16 DIAGNOSIS — G4700 Insomnia, unspecified: Secondary | ICD-10-CM | POA: Insufficient documentation

## 2012-12-16 DIAGNOSIS — Z888 Allergy status to other drugs, medicaments and biological substances status: Secondary | ICD-10-CM | POA: Insufficient documentation

## 2012-12-16 DIAGNOSIS — Z883 Allergy status to other anti-infective agents status: Secondary | ICD-10-CM | POA: Insufficient documentation

## 2012-12-16 DIAGNOSIS — Z430 Encounter for attention to tracheostomy: Secondary | ICD-10-CM | POA: Insufficient documentation

## 2012-12-16 DIAGNOSIS — J398 Other specified diseases of upper respiratory tract: Secondary | ICD-10-CM | POA: Insufficient documentation

## 2012-12-16 DIAGNOSIS — G1221 Amyotrophic lateral sclerosis: Secondary | ICD-10-CM | POA: Insufficient documentation

## 2012-12-16 DIAGNOSIS — Z9911 Dependence on respirator [ventilator] status: Secondary | ICD-10-CM | POA: Insufficient documentation

## 2012-12-16 DIAGNOSIS — Z882 Allergy status to sulfonamides status: Secondary | ICD-10-CM | POA: Insufficient documentation

## 2012-12-16 SURGERY — FIBEROPTIC LARYNGOSCOPY AND TRACHEOSCOPY
Anesthesia: General | Site: Neck | Wound class: Clean Contaminated

## 2012-12-16 MED ORDER — LACTATED RINGERS IV SOLN
INTRAVENOUS | Status: DC
  Administered 2012-12-16: 11:00:00 via INTRAVENOUS

## 2012-12-16 MED ORDER — MEPERIDINE HCL 25 MG/ML IJ SOLN: 6.2500 mg | INTRAMUSCULAR | Status: DC | PRN

## 2012-12-16 MED ORDER — OXYCODONE HCL 5 MG/5ML PO SOLN: 5.0000 mg | Freq: Once | ORAL | Status: DC | PRN

## 2012-12-16 MED ORDER — PROPOFOL INFUSION 10 MG/ML OPTIME
INTRAVENOUS | Status: DC | PRN
  Administered 2012-12-16: 50 ug/kg/min via INTRAVENOUS

## 2012-12-16 MED ORDER — OXYCODONE HCL 5 MG PO TABS: 5.0000 mg | ORAL_TABLET | Freq: Once | ORAL | Status: DC | PRN

## 2012-12-16 MED ORDER — LIDOCAINE HCL (CARDIAC) 20 MG/ML IV SOLN
INTRAVENOUS | Status: DC | PRN
  Administered 2012-12-16: 40 mg via INTRAVENOUS

## 2012-12-16 MED ORDER — LIDOCAINE-EPINEPHRINE 1 %-1:100000 IJ SOLN
INTRAMUSCULAR | Status: AC
  Filled 2012-12-16: qty 1

## 2012-12-16 MED ORDER — ONDANSETRON HCL 4 MG/2ML IJ SOLN: 4.0000 mg | Freq: Once | INTRAMUSCULAR | Status: DC | PRN

## 2012-12-16 MED ORDER — SODIUM CHLORIDE 0.9 % IR SOLN
Status: DC | PRN
  Administered 2012-12-16: 1

## 2012-12-16 MED ORDER — HYDROMORPHONE HCL PF 1 MG/ML IJ SOLN: 0.2500 mg | INTRAMUSCULAR | Status: DC | PRN

## 2012-12-16 SURGICAL SUPPLY — 38 items
BENZOIN TINCTURE PRP APPL 2/3 (GAUZE/BANDAGES/DRESSINGS) IMPLANT
BLADE SURG 11 STRL SS (BLADE) IMPLANT
BLADE SURG 15 STRL LF DISP TIS (BLADE) ×2 IMPLANT
BLADE SURG 15 STRL SS (BLADE) ×1
BLADE SURG ROTATE 9660 (MISCELLANEOUS) IMPLANT
CANISTER SUCTION 2500CC (MISCELLANEOUS) ×3 IMPLANT
CLEANER TIP ELECTROSURG 2X2 (MISCELLANEOUS) ×3 IMPLANT
CLOTH BEACON ORANGE TIMEOUT ST (SAFETY) ×3 IMPLANT
COVER SURGICAL LIGHT HANDLE (MISCELLANEOUS) ×3 IMPLANT
DECANTER SPIKE VIAL GLASS SM (MISCELLANEOUS) IMPLANT
ELECT COATED BLADE 2.86 ST (ELECTRODE) ×3 IMPLANT
ELECT REM PT RETURN 9FT ADLT (ELECTROSURGICAL) ×3
ELECTRODE REM PT RTRN 9FT ADLT (ELECTROSURGICAL) ×2 IMPLANT
GAUZE SPONGE 4X4 16PLY XRAY LF (GAUZE/BANDAGES/DRESSINGS) ×3 IMPLANT
GLOVE BIOGEL PI IND STRL 7.0 (GLOVE) ×2 IMPLANT
GLOVE BIOGEL PI INDICATOR 7.0 (GLOVE) ×1
GLOVE ECLIPSE 7.5 STRL STRAW (GLOVE) ×3 IMPLANT
GLOVE SURG SS PI 7.0 STRL IVOR (GLOVE) ×3 IMPLANT
GOWN STRL NON-REIN LRG LVL3 (GOWN DISPOSABLE) ×3 IMPLANT
KIT BASIN OR (CUSTOM PROCEDURE TRAY) ×3 IMPLANT
KIT ROOM TURNOVER OR (KITS) ×3 IMPLANT
NEEDLE 27GAX1X1/2 (NEEDLE) IMPLANT
NS IRRIG 1000ML POUR BTL (IV SOLUTION) ×3 IMPLANT
PACK EENT II TURBAN DRAPE (CUSTOM PROCEDURE TRAY) ×3 IMPLANT
PAD ARMBOARD 7.5X6 YLW CONV (MISCELLANEOUS) ×6 IMPLANT
PENCIL FOOT CONTROL (ELECTRODE) IMPLANT
SOLUTION BUTLER CLEAR DIP (MISCELLANEOUS) ×3 IMPLANT
SUT CHROMIC 2 0 SH (SUTURE) ×3 IMPLANT
SUT ETHILON 3 0 PS 1 (SUTURE) ×3 IMPLANT
SUT SILK 4 0 TIE 10X30 (SUTURE) IMPLANT
SUT SILK 4 0 TIES 17X18 (SUTURE) IMPLANT
SYR 20CC LL (SYRINGE) IMPLANT
SYR CONTROL 10ML LL (SYRINGE) IMPLANT
TOWEL OR 17X24 6PK STRL BLUE (TOWEL DISPOSABLE) ×3 IMPLANT
TOWEL OR 17X26 10 PK STRL BLUE (TOWEL DISPOSABLE) ×3 IMPLANT
TUBE CONNECTING 12X1/4 (SUCTIONS) ×3 IMPLANT
WATER STERILE IRR 1000ML POUR (IV SOLUTION) IMPLANT
YANKAUER SUCT BULB TIP NO VENT (SUCTIONS) ×3 IMPLANT

## 2012-12-16 NOTE — Transfer of Care (Signed)
Immediate Anesthesia Transfer of Care Note  Patient: [name]  Procedure(s) Performed: Procedure(s): CHANGE TRACH (N/A)  Patient Location: PACU  Anesthesia Type:MAC  Level of Consciousness: responds to stimulation  Airway & Oxygen Therapy: patient remains on home ventilator, patient's private RN at bedside  Post-op Assessment: Report given to PACU RN and Post -op Vital signs reviewed and stable  Post vital signs: Reviewed and stable  Complications: No apparent anesthesia complications

## 2012-12-16 NOTE — Anesthesia Postprocedure Evaluation (Signed)
Anesthesia Post Note  Patient: [name]  Procedure(s) Performed: Procedure(s) (LRB): FIBEROPTIC LARYNGOSCOPY AND TRACHEOSCOPY (N/A)  Anesthesia type: general  Patient location: PACU  Post pain: Pain level controlled  Post assessment: Patient's Cardiovascular Status Stable  Last Vitals:  Filed Vitals:   12/16/12 1255  BP:   Pulse:   Temp: 36.1 C  Resp:     Post vital signs: Reviewed and stable  Level of consciousness: sedated  Complications: No apparent anesthesia complications

## 2012-12-16 NOTE — Op Note (Signed)
OPERATIVE REPORT  DATE OF SURGERY: 12/16/2012  PATIENT:  [name],  57 y.o. male  PRE-OPERATIVE DIAGNOSIS:  TRACHEOSTOMY /ventilator dependent  POST-OPERATIVE DIAGNOSIS:  TRACHEOSTOMY /ventilator dependent  PROCEDURE:  Procedure(s): CHANGE TRACH, fiberoptic tracheobronchoscopy  SURGEON:  Susy Frizzle, MD  ASSISTANTS: None   ANESTHESIA:   None   EBL:  0 ml  DRAINS: None were used   LOCAL MEDICATIONS USED:  None  SPECIMEN:  none  COUNTS:  Correct  PROCEDURE DETAILS: The patient was taken to the operating room and placed on the operating table in the supine position. Patient is on a chronic ventilator and remained so throughout the procedure. The flexible fiberoptic scope was passed through the tracheostomy tube. The lower Airways has significant secretions but no obstruction or any other pathology. The tracheostomy cuff was deflated and the trach tube was slowly advanced while inspecting the tracheal wall. About 1/2 cm above the normal position at the end of the tracheostomy tube there was a flap of tissue arising from the anterior wall that partially obstructed the tracheal lumen. It was soft and flexible and the tube easily lifted out of the way when advanced back again. There are no other lesions identified. Large amount of secretions were suctioned out. The tube was completely removed and then replaced easily without any difficulty and the cuff was able to hold air without trouble. The trach tube was resecured in place. Patient was then transferred to PACU on his the ventilator in stable condition .    PATIENT DISPOSITION:  To PACU, stable

## 2012-12-16 NOTE — Progress Notes (Signed)
Patient arrived via PTAR on home vent with Doneen Poisson, RN home health RN. Patient non verbal unable to move d/t ALS family at bedside as will.

## 2012-12-16 NOTE — OR Nursing (Signed)
Patient arrived to holding area with plastic bag of tracheostomy supplies. Coviden Shiley Tracheostomy Tube XLT Cuffed Proximal with Disposable Inner Cannula was used. Reference [id number]. Package integrity maintained. Expiration 04/2016. Obturator placed in biohazard ("specimen") bag and attached to the paper chart.  Oralia Manis, RN

## 2012-12-16 NOTE — Interval H&P Note (Signed)
History and Physical Interval Note:  12/16/2012 11:41 AM  [name]  has presented today for surgery, with the diagnosis of TRACHEOSTOMY  The various methods of treatment have been discussed with the patient and family. After consideration of risks, benefits and other options for treatment, the patient has consented to  Procedure(s): CHANGE TRACH (N/A) as a surgical intervention .  The patient's history has been reviewed, patient examined, no change in status, stable for surgery.  I have reviewed the patient's chart and labs.  Questions were answered to the patient's satisfaction.     Eugene Zeiders

## 2012-12-16 NOTE — Anesthesia Preprocedure Evaluation (Signed)
Anesthesia Evaluation  Patient identified by MRN, date of birth, ID band Patient awake    Reviewed: Allergy & Precautions, H&P , NPO status , Patient's Chart, lab work & pertinent test results  Airway       Dental   Pulmonary pneumonia -,          Cardiovascular     Neuro/Psych  Neuromuscular disease    GI/Hepatic GERD-  Medicated and Controlled,  Endo/Other    Renal/GU      Musculoskeletal   Abdominal   Peds  Hematology   Anesthesia Other Findings   Reproductive/Obstetrics                           Anesthesia Physical Anesthesia Plan  ASA: III  Anesthesia Plan: General   Post-op Pain Management:    Induction: Intravenous  Airway Management Planned: Tracheostomy  Additional Equipment:   Intra-op Plan:   Post-operative Plan: Post-operative intubation/ventilation  Informed Consent: I have reviewed the patients History and Physical, chart, labs and discussed the procedure including the risks, benefits and alternatives for the proposed anesthesia with the patient or authorized representative who has indicated his/her understanding and acceptance.     Plan Discussed with: CRNA and Surgeon  Anesthesia Plan Comments: (Pt has ALS and is vent dependant. Will sedate IV for trach change.)        Anesthesia Quick Evaluation

## 2012-12-16 NOTE — Progress Notes (Signed)
PT DISCONNECTED FROM OUR MONITOR, CONNECTED TO HOME MONITOR AND VENT BY HOME RN. WILL CONT TO MONITOR . D/C INSTRUCTIONS GIVEN TO HOME RN AND SON. ALL QUESTIONS ANSWERED.

## 2012-12-20 ENCOUNTER — Encounter: Payer: Self-pay | Admitting: Gastroenterology

## 2012-12-21 ENCOUNTER — Telehealth: Payer: Self-pay | Admitting: Pulmonary Disease

## 2012-12-21 ENCOUNTER — Other Ambulatory Visit: Payer: Self-pay | Admitting: Gastroenterology

## 2012-12-21 MED ORDER — FERROUS SULFATE 220 (44 FE) MG/5ML PO ELIX: 220.0000 mg | ORAL_SOLUTION | Freq: Every day | ORAL | Status: DC

## 2012-12-21 NOTE — Telephone Encounter (Signed)
Nurse, Lynden Ang calling with 7-day report: pt seen 12/14/12  States that everything is going very well albuterol and they do not need to start Atrovent at this time.   Vicky wanted to verify instructions for vest : States that original order for [facility] was set at Frequency 10-14 and Pressure 4 x Pt has been increased at some point since 04/2011 per Vicky-- I cannot find in chart where this change was made per KC   Pt increased to Frequency 16 and pressure 8-10 since 04/2011  Wife reports has been set at this for quite some time now and she is trying to figure out when this change was made. They have decreased the settings back to original x past 3 days (Freq 12-14, pressure 4 x )  Vicky wants to speak with Dr Shelle Iron Monday when he returns. Will send message to [facility].

## 2012-12-22 ENCOUNTER — Telehealth: Payer: Self-pay | Admitting: Gastroenterology

## 2012-12-22 MED ORDER — CIPROFLOXACIN 500 MG/5ML (10%) PO SUSR: 750.0000 mg | Freq: Two times a day (BID) | ORAL | Status: DC

## 2012-12-22 NOTE — Telephone Encounter (Signed)
[name] aware of recs. rx called in. Will forward to [facility] as an [place]

## 2012-12-22 NOTE — Telephone Encounter (Signed)
I spoke with [name] (nurse). She stated pt is having tan-yellow secretions. Pt is on last day of cipro. She is wanting to know if we can extend the cipro for another 3-4 days. Since [facility] is off will forward to doc of the day Dr. Maple Hudson. Please advise thanks Last OV 12/14/12  Allergies  Allergen Reactions  . Amitriptyline Hcl     Elavil= unknown per care giver  . Cephalosporins     Unknown per care giver  . Compleat     Unknown per care giver  . Glutathione     Unknown per care giver  . Latex     Unknown per care giver  . Levofloxacin     Levaquin= unknown per care giver  . Rilutek (Riluzole)     Hives//SOB  . Sulfamethoxazole W-Trimethoprim     Septra= unknown per care giver  . Cefdinir Rash  . Dicyclomine Hcl Rash

## 2012-12-22 NOTE — Telephone Encounter (Signed)
Ok to extend script for 4 more days

## 2012-12-22 NOTE — Telephone Encounter (Signed)
Called pharmacy and changed script to G-tube  Instead of by mouth

## 2012-12-28 ENCOUNTER — Telehealth: Payer: Self-pay | Admitting: Pulmonary Disease

## 2012-12-28 NOTE — Telephone Encounter (Signed)
Those settings are ok as long as he doesn't appear to be uncomfortable with them.

## 2012-12-28 NOTE — Telephone Encounter (Signed)
Called, spoke with [name], pt's nurse.   1.  States albuterol neb q4h is working very well for pt.  They do not need to start ipratropium back at this time. 2.  Pt airway vest was set on 14 in the past but this wasn't doing anything.  The settings are currently frequency of 16, pressure 4, x 20 minutes.  This is working for pt.  [name] needs to know if ok to keep this settings or if [facility] recs they be changed.   KC, pls advise.  Thank you.

## 2012-12-29 NOTE — Telephone Encounter (Signed)
Spoke with [name]--  Verbalized understand of recs per Dr. Shelle Iron Stated she would inform family as well Nothing further needed at this time

## 2013-01-11 ENCOUNTER — Telehealth: Payer: Self-pay | Admitting: Pulmonary Disease

## 2013-01-11 NOTE — Telephone Encounter (Signed)
Made Vicky aware. Nothing further was needed

## 2013-01-11 NOTE — Telephone Encounter (Signed)
Pt is doing fine without Ipatropium.  Pressures are good.  Secretions are good.  Should they keep and order to use Ipatropium on a prn basis..  Please advise.

## 2013-01-11 NOTE — Telephone Encounter (Signed)
I would just leave off.  Let us know if something changes.

## 2013-01-19 ENCOUNTER — Telehealth: Payer: Self-pay | Admitting: Pulmonary Disease

## 2013-01-19 NOTE — Telephone Encounter (Signed)
Order signed and faxed. Placed in scan folder. Carron Curie, CMA

## 2013-01-19 NOTE — Telephone Encounter (Signed)
done

## 2013-01-19 NOTE — Telephone Encounter (Signed)
I spoke with [name] and she states when [facility] was out of office CY extended the pt Cipro. They have faxed an order for CY to sign for this order and have not received it back. I advised to fax this to triage fax and I will have CY sign it and fax it back. Will await fax. Carron Curie, CMA

## 2013-01-19 NOTE — Telephone Encounter (Signed)
Faxed received and placed on CY cart to sign. Carron Curie, CMA

## 2013-01-26 ENCOUNTER — Telehealth: Payer: Self-pay | Admitting: Pulmonary Disease

## 2013-01-26 NOTE — Telephone Encounter (Signed)
We can get back on it to see if it will decrease secretion quantity, but it may cause increased secretion viscosity. I think we can try it again and see if it helps.  If PIP remain high, may have to make changes to his vent settings.

## 2013-01-26 NOTE — Telephone Encounter (Signed)
I spoke with vicki and made her aware. She stated she will call us if anything further is needed and if PIP remains high. Will sign off message

## 2013-01-26 NOTE — Telephone Encounter (Signed)
Spoke with the pt's nurse [name] She reports since d/c ipratropium, increased PIPS volume- over 60  Pt also having thicker secretions  She is asking if pt should resume med, or any recs per [facility] Please advise, thanks!

## 2013-01-30 ENCOUNTER — Telehealth: Payer: Self-pay | Admitting: Pulmonary Disease

## 2013-01-30 MED ORDER — AZITHROMYCIN 200 MG/5ML PO SUSR: ORAL | Status: DC

## 2013-01-30 NOTE — Telephone Encounter (Signed)
Asperia (nurse at home) called in regards to secretions.  Pts secretions are yellow/brown, tinged with blood x 1 day  (pt last suctioned a little bit ago and mucus is gray tinged) Low grade temp: 99.3 Pip increased to 50's x 3 days--steadily increasing O2 sats 2L/min--have been stable at 96% and above Pt is currently stable.  Would like to add on Mucinex---Delsym is not helping with congestion. Respiratory therapist recommended pt be given Mucinex.  Asperia aware that [facility] out of office until 1st week of August.   Will send message to Doctor of the Day--Dr Delford Field to advice on recs.  Dr Delford Field, would you recommend any ABX or changes in pt current medications? Would mucinex be okay to add to pt regimen as recommended per RT?

## 2013-01-30 NOTE — Telephone Encounter (Signed)
Ok to add mucinex 1200mg  bid Suggest azithromycin 500mg  daily x 3 days then dc

## 2013-01-30 NOTE — Telephone Encounter (Signed)
Spoke with Asperia: Ok to add mucinex 1200mg  bid  [facility]--- Mucinex 400mg  tablets---crush 3 tablets twice daily-place into NG tube  azithromycin 500mg  daily x 3 days then dc---liquid form per NG tube (azithromycin (ZITHROMAX) 200 MG/5ML suspension - Take 12.5ML(500mg ) by NG tube qd x 3 days then D/C)   [facility] [address]  Requests that this order be faxed to [phone] ATTN: Asperia, RN

## 2013-02-05 ENCOUNTER — Telehealth: Payer: Self-pay | Admitting: Critical Care Medicine

## 2013-02-06 ENCOUNTER — Telehealth: Payer: Self-pay | Admitting: Pulmonary Disease

## 2013-02-06 ENCOUNTER — Telehealth: Payer: Self-pay | Admitting: Critical Care Medicine

## 2013-02-06 MED ORDER — PREDNISONE 5 MG/5ML PO SOLN: ORAL | Status: DC

## 2013-02-06 MED ORDER — DOXYCYCLINE CALCIUM 50 MG/5ML PO SYRP: ORAL_SOLUTION | ORAL | Status: DC

## 2013-02-06 NOTE — Telephone Encounter (Signed)
Error

## 2013-02-06 NOTE — Telephone Encounter (Signed)
Per SN---  Call in   1.  Vibramycin syrup  50 mg / tsp 2 tsp bid x 5 days  2.  pred solution  5 mg/ 5ml 2 tsp bid x 5 days.    If pt is not feeling better then he will need to be seen in the ER or call back for appt.  ashtyn called and spoke with caregiver and they are aware. meds have been sent to the pharmacy.  Nothing further is needed and they are aware to call back for any other concerns.

## 2013-02-06 NOTE — Telephone Encounter (Signed)
Spoke with Asperia--  Pt still having increased brown secretions with blood on suctioning.  RT feels may be irritation with trach. Asperia states that she changed the trach and there was no blood on the trach Pt suctioned and he is still producing bloody mucus. Pain x 3 days---treated with Tylenol.  Swelling is able to be felt on right side--lung region. Vitals stable.  120/60  HR 88  resp 19  99% 2Lmin  98.4 temp  Pt recently treated with abx: 01/30/13 Spoke with Asperia:  Ok to add mucinex 1200mg  bid  Recommended [facility]--- Mucinex 400mg  tablets---crush 3 tablets twice daily-place into NG tube  azithromycin 500mg  daily x 3 days then dc---liquid form per NG tube  (azithromycin (ZITHROMAX) 200 MG/5ML suspension - Take 12.5ML(500mg ) by NG tube qd x 3 days then D/C)   Please advise Dr Kriste Basque if patient needs another round of abx, appt or cxr. Thanks.

## 2013-02-14 NOTE — Telephone Encounter (Signed)
Will have to call Asperia in the AM---pt home phone not working.  Multiple attempts to contact patient and they could not hear me or the phone was doing the "busy signal"  Need to know if pt still needs the mucinex 400mg  or if they have already purchased this--we received a refill request for this medication 2 weeks after recs given by PW 01/30/13

## 2013-02-15 ENCOUNTER — Other Ambulatory Visit: Payer: Self-pay | Admitting: Pulmonary Disease

## 2013-02-15 NOTE — Telephone Encounter (Signed)
Pt given OTC Mucinex Rx 01/30/13 by Dr Delford Field while [facility] out of office.   01/30/13---Ok to add mucinex 1200mg  bid  Recommended [facility]--- Mucinex 400mg  tablets---crush 3 tablets twice daily-place into NG tube  Pt wife went to pharm to get more of this--states it is working great with his congestion. [facility] Shelle Iron, are you okay with refilling the medication below? Mucinex 400mg  tablets---crush 3 tablets twice daily-place into NG tube

## 2013-02-17 NOTE — Telephone Encounter (Signed)
Called in Mucinex 400mg  tablets---crush 3 tablets twice daily-place into NG tube [facility]

## 2013-02-17 NOTE — Telephone Encounter (Signed)
Ok with me.  This is an otc med, why do they need script???

## 2013-03-17 ENCOUNTER — Telehealth: Payer: Self-pay | Admitting: Pulmonary Disease

## 2013-03-17 DIAGNOSIS — J9610 Chronic respiratory failure, unspecified whether with hypoxia or hypercapnia: Secondary | ICD-10-CM

## 2013-03-17 NOTE — Telephone Encounter (Signed)
I spooke with [name] and she stated pt neb machine is messed up and is needing a new one today. I have placed order and sten urgent message to [place]. Nothing further needed

## 2013-04-06 ENCOUNTER — Telehealth: Payer: Self-pay

## 2013-04-06 NOTE — Telephone Encounter (Signed)
Nurse for [name] called and they need an order to stop ointments to his G tube site. Order given to stop all ointments. The ointments were causing problems with the button and his site looks great.

## 2013-04-21 ENCOUNTER — Telehealth: Payer: Self-pay | Admitting: Pulmonary Disease

## 2013-04-21 NOTE — Telephone Encounter (Signed)
I spoke with the pt spouse and she states they have a new insurance policy and they are needing a new letter written just like the on written last year dated 12/09/11, it just needs a current date on it. She requests it be mailed to her. Please advise if ok to do. Thanks. Carron Curie, CMA

## 2013-04-24 ENCOUNTER — Encounter: Payer: Self-pay | Admitting: *Deleted

## 2013-04-24 NOTE — Telephone Encounter (Signed)
Sounds good to me.  Just change date and give to me for signature.

## 2013-04-24 NOTE — Telephone Encounter (Signed)
Letter has been done. Giving to [facility] to sign and will mail off. Spouse is aware and confirmed mailing address. Nothing further needed

## 2013-04-24 NOTE — Telephone Encounter (Signed)
Copy of letter printed from ov dated 12/09/11. Copy placed in your look at for review.  If letter is acceptable we can copy and past and change the date if ok with you.  Please advise.

## 2013-04-24 NOTE — Telephone Encounter (Signed)
I don't see this letter in the computer.  See if we can find and print.  Also, see if we can find out specifically what the letter is to address, and why it is needed.

## 2013-04-25 ENCOUNTER — Other Ambulatory Visit: Payer: Self-pay | Admitting: Gastroenterology

## 2013-05-18 ENCOUNTER — Telehealth: Payer: Self-pay | Admitting: Gastroenterology

## 2013-05-18 NOTE — Telephone Encounter (Signed)
Spoke with [name] and she is aware.

## 2013-05-18 NOTE — Telephone Encounter (Signed)
Pts nurse went to change his peg tube and got a back flow of very dark gastric juices. She is concerned because she states the pt has had a GI bleed in the past. Nurse would like to know if they could have an order to guaiac the gastric fluid. Please advise.

## 2013-05-18 NOTE — Telephone Encounter (Signed)
yes

## 2013-06-21 ENCOUNTER — Telehealth: Payer: Self-pay | Admitting: Gastroenterology

## 2013-06-21 NOTE — Telephone Encounter (Signed)
The home health nurses change his feeding port every 4 mths or as needed. They need an order from Dr. Arlyce Dice stating this is ok faxed to the number listed. Order faxed.

## 2013-06-27 ENCOUNTER — Telehealth: Payer: Self-pay | Admitting: Gastroenterology

## 2013-06-27 NOTE — Telephone Encounter (Signed)
Home health RN called and wanted to let Dr. Arlyce Dice know that over the weekend and today there has been increase residual from his tube. Today there was greater than 200cc. Pt has had some increased bloating. Pt is taking Lansoprazole 30mg  BID.

## 2013-06-27 NOTE — Telephone Encounter (Signed)
Nothing specific to do unless residuals increase or he develops vomiting.  Continue to observe the

## 2013-07-12 ENCOUNTER — Telehealth: Payer: Self-pay | Admitting: Pulmonary Disease

## 2013-07-12 ENCOUNTER — Telehealth: Payer: Self-pay

## 2013-07-12 MED ORDER — HYDROCODONE-ACETAMINOPHEN 5-500 MG PO TABS: 1.0000 | ORAL_TABLET | ORAL | Status: DC | PRN

## 2013-07-12 NOTE — Telephone Encounter (Signed)
I called and spoke with Chip BoerVicki (pt nurse). She reports they are needing a hard script to D/C the MSIR 30 mg. Pt has not had this over a year and medication has expired. Wife is wanting to pick this up tomorrow and will give to the nurse since she has to see Dr. Arlyce DiceKaplan to get a script for pt as well. Please advise KC if you are okay with this? thanks

## 2013-07-12 NOTE — Telephone Encounter (Signed)
Order has been signed and placed upfront for pick up.  Wife is aware. Nothing further needed

## 2013-07-12 NOTE — Telephone Encounter (Signed)
Pt needs new script for pain medication because the previous one has expired. Wife would like to pick up script tomorrow. Script printed and placed up front for pick-up. [name] RN with home health aware.

## 2013-07-12 NOTE — Telephone Encounter (Signed)
Sure. Ok with me.  

## 2013-07-14 ENCOUNTER — Telehealth: Payer: Self-pay | Admitting: Gastroenterology

## 2013-07-14 NOTE — Telephone Encounter (Signed)
[facility] and updated the sig. [name] RN aware also.

## 2013-07-14 NOTE — Telephone Encounter (Signed)
Dr Arlyce Dicekaplan signed and I faxed back

## 2013-07-14 NOTE — Telephone Encounter (Signed)
yes

## 2013-07-14 NOTE — Telephone Encounter (Signed)
Home health RN called and states they need the Lortab pain med script to read take 1 tab by mouth every 4 hours and if no relief may repeat 1 tablet in 2 hours. Dr. Arlyce DiceKaplan are you ok with those instructions? Please advise.

## 2013-07-28 ENCOUNTER — Other Ambulatory Visit: Payer: Self-pay | Admitting: Pulmonary Disease

## 2013-08-01 ENCOUNTER — Telehealth: Payer: Self-pay | Admitting: Gastroenterology

## 2013-08-02 NOTE — Telephone Encounter (Signed)
[facility] with [facility] that we have not received orders for a formula change on this pt.

## 2013-08-03 ENCOUNTER — Other Ambulatory Visit: Payer: Self-pay | Admitting: Pulmonary Disease

## 2013-08-23 ENCOUNTER — Other Ambulatory Visit: Payer: Self-pay | Admitting: Gastroenterology

## 2013-10-27 ENCOUNTER — Telehealth: Payer: Self-pay | Admitting: Gastroenterology

## 2013-10-27 NOTE — Telephone Encounter (Signed)
Pts nurse would like to know if they can have the pts ferritin level checked to see if he still needs to be taking iron. Pt has been having a lot of gas and constipation and bloating. Also wants to know if pt needs to continue taking benfiber. Please advise.

## 2013-10-27 NOTE — Telephone Encounter (Signed)
Spoke with L-3 CommunicationsVicky RN, she is aware and they will draw the Ferritin level in the home. Order faxed to [phone].

## 2013-10-27 NOTE — Telephone Encounter (Signed)
Yes and yes 

## 2013-11-15 ENCOUNTER — Other Ambulatory Visit: Payer: Self-pay | Admitting: Gastroenterology

## 2013-11-16 ENCOUNTER — Telehealth: Payer: Self-pay | Admitting: Pulmonary Disease

## 2013-11-16 ENCOUNTER — Telehealth: Payer: Self-pay | Admitting: Gastroenterology

## 2013-11-16 MED ORDER — CIPROFLOXACIN HCL 750 MG PO TABS: 750.0000 mg | ORAL_TABLET | Freq: Two times a day (BID) | ORAL | Status: DC

## 2013-11-16 NOTE — Telephone Encounter (Signed)
Spoke with [name] and gave VO for meds via tube and not po  She verbalized understanding  Nothing further needed

## 2013-11-16 NOTE — Telephone Encounter (Signed)
[facility] and she is aware.

## 2013-11-16 NOTE — Telephone Encounter (Signed)
I called Vicky. She reports pt is allergic to Levaquin. His lips turned bright red and was very flushed. They have tried this twice on pt and same reaction. Please advise thanks Allergies  Allergen Reactions  . Amitriptyline Hcl     Elavil= unknown per care giver  . Cephalosporins     Unknown per care giver  . Compleat     Unknown per care giver  . Glutathione     Unknown per care giver  . Latex     Unknown per care giver  . Levofloxacin     Levaquin= unknown per care giver  . Rilutek [Riluzole]     Hives//SOB  . Sulfamethoxazole-Trimethoprim     Septra= unknown per care giver  . Cefdinir Rash  . Dicyclomine Hcl Rash

## 2013-11-16 NOTE — Telephone Encounter (Signed)
Apply hydrogen peroxide to the site twice a day

## 2013-11-16 NOTE — Telephone Encounter (Signed)
HH nurse called and states that the pts G tube has some yellow drainage around the site, no foul odor to the drainage. States there is some slight redness near site also. Nurse calling to see what Dr. Arlyce DiceKaplan would like them to do. Please advise.

## 2013-11-16 NOTE — Telephone Encounter (Signed)
Called spoke with Vicky. Aware of recs. RX sent. Nothing further needed

## 2013-11-16 NOTE — Telephone Encounter (Signed)
Spoke with Larene BeachVickie , pt's home health nurse.  PT having increased secretions (white), Low grade temp (99 to 100.6).  PIP on vent 45-60.  Using tylenol, mucinex and vest therapy, turning pt every hour.  Please advise if anything else for increased secretions.

## 2013-11-16 NOTE — Telephone Encounter (Signed)
It sounds like he may be developing a pulmonary infection.  I see listed an allergy to levaquin??  Is this real?

## 2013-11-16 NOTE — Telephone Encounter (Signed)
Let's try cipro 750mg  bid for 7 days to see if this reduces secretions.  Let us know if not improving.

## 2013-11-21 ENCOUNTER — Inpatient Hospital Stay (HOSPITAL_COMMUNITY)
Admission: EM | Admit: 2013-11-21 | Discharge: 2013-11-24 | DRG: 871 | Disposition: A | Discharge status: Home Health | Payer: Medicare Other | Attending: Internal Medicine | Admitting: Internal Medicine

## 2013-11-21 ENCOUNTER — Emergency Department (HOSPITAL_COMMUNITY): Payer: Medicare Other

## 2013-11-21 ENCOUNTER — Encounter (HOSPITAL_COMMUNITY): Payer: Self-pay | Admitting: Emergency Medicine

## 2013-11-21 ENCOUNTER — Encounter: Payer: Self-pay | Admitting: Gastroenterology

## 2013-11-21 ENCOUNTER — Telehealth: Payer: Self-pay | Admitting: Pulmonary Disease

## 2013-11-21 DIAGNOSIS — F410 Panic disorder [episodic paroxysmal anxiety] without agoraphobia: Secondary | ICD-10-CM | POA: Diagnosis present

## 2013-11-21 DIAGNOSIS — G1221 Amyotrophic lateral sclerosis: Secondary | ICD-10-CM | POA: Diagnosis present

## 2013-11-21 DIAGNOSIS — Z800 Family history of malignant neoplasm of digestive organs: Secondary | ICD-10-CM

## 2013-11-21 DIAGNOSIS — A419 Sepsis, unspecified organism: Secondary | ICD-10-CM | POA: Diagnosis present

## 2013-11-21 DIAGNOSIS — J189 Pneumonia, unspecified organism: Secondary | ICD-10-CM

## 2013-11-21 DIAGNOSIS — K219 Gastro-esophageal reflux disease without esophagitis: Secondary | ICD-10-CM | POA: Diagnosis present

## 2013-11-21 DIAGNOSIS — F3289 Other specified depressive episodes: Secondary | ICD-10-CM | POA: Diagnosis present

## 2013-11-21 DIAGNOSIS — Z930 Tracheostomy status: Secondary | ICD-10-CM

## 2013-11-21 DIAGNOSIS — Z66 Do not resuscitate: Secondary | ICD-10-CM | POA: Diagnosis present

## 2013-11-21 DIAGNOSIS — N390 Urinary tract infection, site not specified: Secondary | ICD-10-CM | POA: Diagnosis present

## 2013-11-21 DIAGNOSIS — Z931 Gastrostomy status: Secondary | ICD-10-CM | POA: Diagnosis not present

## 2013-11-21 DIAGNOSIS — G8250 Quadriplegia, unspecified: Secondary | ICD-10-CM | POA: Diagnosis present

## 2013-11-21 DIAGNOSIS — R32 Unspecified urinary incontinence: Secondary | ICD-10-CM | POA: Diagnosis present

## 2013-11-21 DIAGNOSIS — Z9911 Dependence on respirator [ventilator] status: Secondary | ICD-10-CM

## 2013-11-21 DIAGNOSIS — J9610 Chronic respiratory failure, unspecified whether with hypoxia or hypercapnia: Secondary | ICD-10-CM

## 2013-11-21 DIAGNOSIS — G4700 Insomnia, unspecified: Secondary | ICD-10-CM | POA: Diagnosis present

## 2013-11-21 DIAGNOSIS — J9620 Acute and chronic respiratory failure, unspecified whether with hypoxia or hypercapnia: Secondary | ICD-10-CM | POA: Diagnosis present

## 2013-11-21 DIAGNOSIS — F329 Major depressive disorder, single episode, unspecified: Secondary | ICD-10-CM | POA: Diagnosis present

## 2013-11-21 DIAGNOSIS — J690 Pneumonitis due to inhalation of food and vomit: Secondary | ICD-10-CM | POA: Diagnosis present

## 2013-11-21 LAB — I-STAT ARTERIAL BLOOD GAS, ED
Acid-Base Excess: 4 mmol/L — ABNORMAL HIGH (ref 0.0–2.0)
Bicarbonate: 28.4 mEq/L — ABNORMAL HIGH (ref 20.0–24.0)
O2 SAT: 98 %
TCO2: 30 mmol/L (ref 0–100)
pCO2 arterial: 44.4 mmHg (ref 35.0–45.0)
pH, Arterial: 7.417 (ref 7.350–7.450)
pO2, Arterial: 101 mmHg — ABNORMAL HIGH (ref 80.0–100.0)

## 2013-11-21 LAB — CBC
HCT: 37.7 % — ABNORMAL LOW (ref 39.0–52.0)
Hemoglobin: 12.4 g/dL — ABNORMAL LOW (ref 13.0–17.0)
MCH: 26 pg (ref 26.0–34.0)
MCHC: 32.9 g/dL (ref 30.0–36.0)
MCV: 79 fL (ref 78.0–100.0)
PLATELETS: 352 10*3/uL (ref 150–400)
RBC: 4.77 MIL/uL (ref 4.22–5.81)
RDW: 17.5 % — ABNORMAL HIGH (ref 11.5–15.5)
WBC: 13.7 10*3/uL — ABNORMAL HIGH (ref 4.0–10.5)

## 2013-11-21 LAB — COMPREHENSIVE METABOLIC PANEL
ALT: 37 U/L (ref 0–53)
AST: 29 U/L (ref 0–37)
Albumin: 3.2 g/dL — ABNORMAL LOW (ref 3.5–5.2)
Alkaline Phosphatase: 130 U/L — ABNORMAL HIGH (ref 39–117)
BUN: 12 mg/dL (ref 6–23)
CALCIUM: 9.6 mg/dL (ref 8.4–10.5)
CO2: 24 mEq/L (ref 19–32)
Chloride: 96 mEq/L (ref 96–112)
Creatinine, Ser: <0.2 mg/dL — ABNORMAL LOW (ref 0.50–1.35)
GLUCOSE: 287 mg/dL — AB (ref 70–99)
Potassium: 4 mEq/L (ref 3.7–5.3)
SODIUM: 134 meq/L — AB (ref 137–147)
Total Bilirubin: 0.4 mg/dL (ref 0.3–1.2)
Total Protein: 8.6 g/dL — ABNORMAL HIGH (ref 6.0–8.3)

## 2013-11-21 LAB — CBC WITH DIFFERENTIAL/PLATELET
BASOS ABS: 0.1 10*3/uL (ref 0.0–0.1)
BASOS PCT: 0 % (ref 0–1)
EOS ABS: 0.1 10*3/uL (ref 0.0–0.7)
EOS PCT: 1 % (ref 0–5)
HCT: 40.5 % (ref 39.0–52.0)
Hemoglobin: 13.2 g/dL (ref 13.0–17.0)
Lymphocytes Relative: 6 % — ABNORMAL LOW (ref 12–46)
Lymphs Abs: 1 10*3/uL (ref 0.7–4.0)
MCH: 26 pg (ref 26.0–34.0)
MCHC: 32.6 g/dL (ref 30.0–36.0)
MCV: 79.7 fL (ref 78.0–100.0)
Monocytes Absolute: 1.1 10*3/uL — ABNORMAL HIGH (ref 0.1–1.0)
Monocytes Relative: 7 % (ref 3–12)
Neutro Abs: 13.9 10*3/uL — ABNORMAL HIGH (ref 1.7–7.7)
Neutrophils Relative %: 86 % — ABNORMAL HIGH (ref 43–77)
PLATELETS: 314 10*3/uL (ref 150–400)
RBC: 5.08 MIL/uL (ref 4.22–5.81)
RDW: 17.5 % — AB (ref 11.5–15.5)
WBC: 16.2 10*3/uL — ABNORMAL HIGH (ref 4.0–10.5)

## 2013-11-21 LAB — URINALYSIS, ROUTINE W REFLEX MICROSCOPIC
Bilirubin Urine: NEGATIVE
Glucose, UA: >1000 mg/dL — AB
Hgb urine dipstick: NEGATIVE
Ketones, ur: NEGATIVE mg/dL
Leukocytes, UA: NEGATIVE
NITRITE: POSITIVE — AB
Protein, ur: NEGATIVE mg/dL
SPECIFIC GRAVITY, URINE: 1.018 (ref 1.005–1.030)
Urobilinogen, UA: 1 mg/dL (ref 0.0–1.0)
pH: 7 (ref 5.0–8.0)

## 2013-11-21 LAB — CREATININE, SERUM

## 2013-11-21 LAB — URINE MICROSCOPIC-ADD ON

## 2013-11-21 LAB — CBG MONITORING, ED: GLUCOSE-CAPILLARY: 179 mg/dL — AB (ref 70–99)

## 2013-11-21 LAB — LIPASE, BLOOD: LIPASE: 38 U/L (ref 11–59)

## 2013-11-21 LAB — I-STAT CG4 LACTIC ACID, ED: LACTIC ACID, VENOUS: 2.12 mmol/L (ref 0.5–2.2)

## 2013-11-21 MED ORDER — CETIRIZINE HCL 5 MG/5ML PO SYRP: 5.0000 mg | ORAL_SOLUTION | Freq: Every day | ORAL | Status: DC

## 2013-11-21 MED ORDER — POLYVINYL ALCOHOL 1.4 % OP SOLN
1.0000 [drp] | OPHTHALMIC | Status: DC | PRN
  Administered 2013-11-21: 1 [drp] via OPHTHALMIC
  Filled 2013-11-21: qty 15

## 2013-11-21 MED ORDER — ADULT MULTIVITAMIN LIQUID CH
5.0000 mL | Freq: Every day | ORAL | Status: DC
  Administered 2013-11-22 – 2013-11-24 (×3): 5 mL via ORAL
  Filled 2013-11-21 (×4): qty 5

## 2013-11-21 MED ORDER — MULTIVITAMIN & MINERAL PO LIQD: 30.0000 mL | Freq: Every day | ORAL | Status: DC

## 2013-11-21 MED ORDER — SODIUM CHLORIDE 0.9 % IV BOLUS (SEPSIS)
1000.0000 mL | Freq: Once | INTRAVENOUS | Status: AC
  Administered 2013-11-21: 1000 mL via INTRAVENOUS

## 2013-11-21 MED ORDER — DEXTROMETHORPHAN POLISTIREX 30 MG/5ML PO LQCR
60.0000 mg | Freq: Two times a day (BID) | ORAL | Status: DC | PRN
  Filled 2013-11-21: qty 10

## 2013-11-21 MED ORDER — CYANOCOBALAMIN 1000 MCG/ML IJ SOLN: 1000.0000 ug | INTRAMUSCULAR | Status: DC

## 2013-11-21 MED ORDER — VENLAFAXINE HCL 75 MG PO TABS
75.0000 mg | ORAL_TABLET | ORAL | Status: DC
  Administered 2013-11-22 – 2013-11-23 (×2): 75 mg
  Filled 2013-11-21 (×4): qty 1

## 2013-11-21 MED ORDER — DIPHENHYDRAMINE HCL 50 MG/ML IJ SOLN
25.0000 mg | Freq: Once | INTRAMUSCULAR | Status: AC
  Administered 2013-11-22: 25 mg via INTRAVENOUS
  Filled 2013-11-21: qty 1

## 2013-11-21 MED ORDER — CLONAZEPAM 0.5 MG PO TABS
0.5000 mg | ORAL_TABLET | Freq: Two times a day (BID) | ORAL | Status: DC
  Administered 2013-11-21 – 2013-11-24 (×6): 0.5 mg
  Filled 2013-11-21 (×6): qty 1

## 2013-11-21 MED ORDER — VANCOMYCIN HCL IN DEXTROSE 1-5 GM/200ML-% IV SOLN
1000.0000 mg | Freq: Two times a day (BID) | INTRAVENOUS | Status: DC
  Administered 2013-11-22 – 2013-11-23 (×3): 1000 mg via INTRAVENOUS
  Filled 2013-11-21 (×4): qty 200

## 2013-11-21 MED ORDER — IPRATROPIUM BROMIDE 0.02 % IN SOLN: 0.5000 mg | Freq: Four times a day (QID) | RESPIRATORY_TRACT | Status: DC | PRN

## 2013-11-21 MED ORDER — VANCOMYCIN HCL 10 G IV SOLR
1500.0000 mg | Freq: Once | INTRAVENOUS | Status: AC
  Administered 2013-11-21: 1500 mg via INTRAVENOUS
  Filled 2013-11-21: qty 1500

## 2013-11-21 MED ORDER — LANSOPRAZOLE 3 MG/ML SUSP: 30.0000 mg | Freq: Every day | ORAL | Status: DC

## 2013-11-21 MED ORDER — PULMOCARE PO LIQD
240.0000 mL | Freq: Four times a day (QID) | ORAL | Status: DC
  Administered 2013-11-21 – 2013-11-22 (×3): 240 mL
  Filled 2013-11-21 (×2): qty 1000

## 2013-11-21 MED ORDER — ALBUTEROL SULFATE (2.5 MG/3ML) 0.083% IN NEBU: 2.5000 mg | INHALATION_SOLUTION | RESPIRATORY_TRACT | Status: DC | PRN

## 2013-11-21 MED ORDER — FERROUS SULFATE 300 (60 FE) MG/5ML PO SYRP
300.0000 mg | ORAL_SOLUTION | Freq: Every day | ORAL | Status: DC
  Administered 2013-11-22 – 2013-11-24 (×3): 300 mg
  Filled 2013-11-21 (×4): qty 5

## 2013-11-21 MED ORDER — PIPERACILLIN-TAZOBACTAM 3.375 G IVPB 30 MIN
3.3750 g | Freq: Once | INTRAVENOUS | Status: AC
  Administered 2013-11-21: 3.375 g via INTRAVENOUS
  Filled 2013-11-21: qty 50

## 2013-11-21 MED ORDER — POLYETHYL GLYCOL-PROPYL GLYCOL 0.4-0.3 % OP SOLN: 1.0000 [drp] | OPHTHALMIC | Status: DC | PRN

## 2013-11-21 MED ORDER — VENLAFAXINE HCL 75 MG PO TABS
150.0000 mg | ORAL_TABLET | ORAL | Status: DC
  Administered 2013-11-22 – 2013-11-24 (×5): 150 mg via ORAL
  Filled 2013-11-21 (×9): qty 2

## 2013-11-21 MED ORDER — DIPHENHYDRAMINE HCL 12.5 MG/5ML PO ELIX
25.0000 mg | ORAL_SOLUTION | ORAL | Status: DC | PRN
  Administered 2013-11-22 – 2013-11-23 (×2): 25 mg
  Filled 2013-11-21 (×3): qty 10

## 2013-11-21 MED ORDER — ALUM HYDROXIDE-MAG CARBONATE 31.7-137 MG/5ML PO SUSP: 10.0000 mL | Freq: Four times a day (QID) | ORAL | Status: DC | PRN

## 2013-11-21 MED ORDER — HEPARIN SODIUM (PORCINE) 5000 UNIT/ML IJ SOLN
5000.0000 [IU] | Freq: Three times a day (TID) | INTRAMUSCULAR | Status: DC
  Administered 2013-11-21 – 2013-11-24 (×8): 5000 [IU] via SUBCUTANEOUS
  Filled 2013-11-21 (×13): qty 1

## 2013-11-21 MED ORDER — ALUM & MAG HYDROXIDE-SIMETH 200-200-20 MG/5ML PO SUSP: 15.0000 mL | Freq: Four times a day (QID) | ORAL | Status: DC | PRN

## 2013-11-21 MED ORDER — POLYETHYLENE GLYCOL 3350 17 G PO PACK: 17.0000 g | PACK | Freq: Every day | ORAL | Status: DC | PRN

## 2013-11-21 MED ORDER — BACITRACIN-POLYMYXIN B 500-10000 UNIT/GM EX OINT: 1.0000 "application " | TOPICAL_OINTMENT | Freq: Every day | CUTANEOUS | Status: DC

## 2013-11-21 MED ORDER — POLYETHYLENE GLYCOL 3350 17 G PO PACK
17.0000 g | PACK | Freq: Every day | ORAL | Status: DC
  Administered 2013-11-22 – 2013-11-23 (×2): 17 g
  Filled 2013-11-21 (×4): qty 1

## 2013-11-21 MED ORDER — NYSTATIN 100000 UNIT/ML MT SUSP
5.0000 mL | Freq: Four times a day (QID) | OROMUCOSAL | Status: DC | PRN
  Filled 2013-11-21: qty 5

## 2013-11-21 MED ORDER — LORAZEPAM 0.5 MG PO TABS: 1.0000 mg | ORAL_TABLET | ORAL | Status: DC | PRN

## 2013-11-21 MED ORDER — VITAMIN B-12 1000 MCG PO TABS
1000.0000 ug | ORAL_TABLET | Freq: Every day | ORAL | Status: DC
  Administered 2013-11-22 – 2013-11-24 (×3): 1000 ug
  Filled 2013-11-21 (×4): qty 1

## 2013-11-21 MED ORDER — TEMAZEPAM 15 MG PO CAPS
30.0000 mg | ORAL_CAPSULE | Freq: Every day | ORAL | Status: DC
  Administered 2013-11-21 – 2013-11-23 (×3): 30 mg
  Filled 2013-11-21 (×3): qty 2

## 2013-11-21 MED ORDER — PIPERACILLIN-TAZOBACTAM 3.375 G IVPB
3.3750 g | Freq: Three times a day (TID) | INTRAVENOUS | Status: DC
  Administered 2013-11-22 – 2013-11-24 (×7): 3.375 g via INTRAVENOUS
  Filled 2013-11-21 (×9): qty 50

## 2013-11-21 MED ORDER — MORPHINE SULFATE 15 MG PO TABS: 15.0000 mg | ORAL_TABLET | Freq: Four times a day (QID) | ORAL | Status: DC | PRN

## 2013-11-21 MED ORDER — CIPROFLOXACIN IN D5W 400 MG/200ML IV SOLN
400.0000 mg | Freq: Two times a day (BID) | INTRAVENOUS | Status: DC
  Administered 2013-11-21: 400 mg via INTRAVENOUS
  Filled 2013-11-21 (×3): qty 200

## 2013-11-21 MED ORDER — ALBUTEROL SULFATE (2.5 MG/3ML) 0.083% IN NEBU
2.5000 mg | INHALATION_SOLUTION | RESPIRATORY_TRACT | Status: DC
  Administered 2013-11-22 – 2013-11-24 (×15): 2.5 mg via RESPIRATORY_TRACT
  Filled 2013-11-21 (×15): qty 3

## 2013-11-21 MED ORDER — IPRATROPIUM-ALBUTEROL 0.5-2.5 (3) MG/3ML IN SOLN
3.0000 mL | RESPIRATORY_TRACT | Status: DC
  Administered 2013-11-21: 3 mL via RESPIRATORY_TRACT
  Filled 2013-11-21 (×2): qty 3

## 2013-11-21 MED ORDER — MAGNESIUM HYDROXIDE 400 MG/5ML PO SUSP: 5.0000 mL | Freq: Every day | ORAL | Status: DC | PRN

## 2013-11-21 MED ORDER — FERROUS SULFATE 220 (44 FE) MG/5ML PO ELIX
220.0000 mg | ORAL_SOLUTION | Freq: Every day | ORAL | Status: DC
  Filled 2013-11-21 (×2): qty 5

## 2013-11-21 MED ORDER — INSULIN ASPART 100 UNIT/ML ~~LOC~~ SOLN
0.0000 [IU] | SUBCUTANEOUS | Status: DC
  Administered 2013-11-22: 3 [IU] via SUBCUTANEOUS
  Administered 2013-11-22: 2 [IU] via SUBCUTANEOUS
  Administered 2013-11-22: 3 [IU] via SUBCUTANEOUS
  Administered 2013-11-22 – 2013-11-23 (×4): 5 [IU] via SUBCUTANEOUS
  Administered 2013-11-23 (×2): 3 [IU] via SUBCUTANEOUS
  Administered 2013-11-23: 5 [IU] via SUBCUTANEOUS
  Administered 2013-11-23 (×2): 3 [IU] via SUBCUTANEOUS
  Administered 2013-11-24: 2 [IU] via SUBCUTANEOUS
  Administered 2013-11-24: 3 [IU] via SUBCUTANEOUS

## 2013-11-21 MED ORDER — IPRATROPIUM BROMIDE 0.02 % IN SOLN: 500.0000 ug | Freq: Four times a day (QID) | RESPIRATORY_TRACT | Status: DC

## 2013-11-21 MED ORDER — SODIUM CHLORIDE 0.9 % IV SOLN
Freq: Once | INTRAVENOUS | Status: AC
  Administered 2013-11-21: 18:00:00 via INTRAVENOUS

## 2013-11-21 MED ORDER — CARBAMIDE PEROXIDE 6.5 % OT SOLN: 2.0000 [drp] | OTIC | Status: DC | PRN

## 2013-11-21 MED ORDER — ACETAMINOPHEN 500 MG PO TABS: 1000.0000 mg | ORAL_TABLET | ORAL | Status: DC | PRN

## 2013-11-21 MED ORDER — VENLAFAXINE HCL 75 MG PO TABS: 75.0000 mg | ORAL_TABLET | Freq: Three times a day (TID) | ORAL | Status: DC

## 2013-11-21 MED ORDER — VITAMIN B-12 500 MCG PO TABS: 1000.0000 ug | ORAL_TABLET | Freq: Every day | ORAL | Status: DC

## 2013-11-21 MED ORDER — PANTOPRAZOLE SODIUM 40 MG PO PACK
40.0000 mg | PACK | Freq: Two times a day (BID) | ORAL | Status: DC
  Administered 2013-11-21 – 2013-11-24 (×6): 40 mg
  Filled 2013-11-21 (×9): qty 20

## 2013-11-21 MED ORDER — POLYETHYLENE GLYCOL 3350 17 GM/SCOOP PO POWD: 1.0000 | Freq: Every day | ORAL | Status: DC

## 2013-11-21 MED ORDER — HYDROCODONE-ACETAMINOPHEN 5-325 MG PO TABS: 1.0000 | ORAL_TABLET | Freq: Four times a day (QID) | ORAL | Status: DC | PRN

## 2013-11-21 NOTE — Telephone Encounter (Signed)
Error

## 2013-11-21 NOTE — ED Notes (Signed)
Pt in-line suctioned and suctioning performed orally and at neck. Pt repositioned.

## 2013-11-21 NOTE — Telephone Encounter (Signed)
[facility] with [facility] He is aware pt is being taken to ED  Will close encounter

## 2013-11-21 NOTE — ED Notes (Signed)
Pt's wife states home health nurses remain at pt's bedside with pt 24/7.

## 2013-11-21 NOTE — ED Notes (Signed)
PA at bedside.

## 2013-11-21 NOTE — ED Notes (Signed)
Suctioned pt's oral secretions, pt tolerated well.

## 2013-11-21 NOTE — H&P (Addendum)
Hospitalist Admission History and Physical  Patient name: [name] Medical record number: [id number] Date of birth: Jul 04, 1956 Age: 58 y.o. Gender: male  Primary Care Provider: Stefani Dama, MD  Chief Complaint: sepsis, HCAP, UTI History of Present Illness:This is a 58 y.o. 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 Patient's wife states patient has had increasing blood pressures, fluctuating fevers, increased secretion production over the past week. Temperature was up to 2.6 last week. Was given Tylenol for this. Has had progressive worsening sputum production despite aggressive secretion suctioning by around-the-clock home health. Patient has baseline orders less wishes to remain at home. However, home so concerned about fever increased secretions and patient was therefore brought to the ER.  On presentation, patient with a temperature of 100.1, white blood cell count 16.2. KUB negative for bowel traction. Chest x-ray concerning for bilateral pneumonia. No evidence of CHF. UA indicative of infection. PH and PO2 WNL on ABG. Mildly elevated bicarb @ 28.4. Lactate 2.12  Initial plan was for patient to go home with the PICC line at patient's request. However, this was not able to be done after 5:00. I spoke with the technologist from interventional radiology, to plan on placing a PICC line in the morning.  Patient Active Problem List   Diagnosis Date Noted  . Sepsis 11/21/2013  . Iron deficiency anemia, unspecified 12/14/2012  . 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 Acute cholecystitis 04/10/2011  . Amyotrophic lateral sclerosis 07/22/2009  . RESPIRATORY FAILURE, CHRONIC 06/21/2007   Past Medical History: Past Medical History  Diagnosis Date  . ALS (amyotrophic lateral sclerosis)     vent dependent  . Ventilator dependent   . Pneumonia     hx of   . Feeding tube blocked   . History of IBS     miralax  daily  . History of constipation   . Urinary incontinence     condom cath  . GERD (gastroesophageal reflux disease)     Lansoprazole-given through feeding tube  . Depression     given via tube Klonopin bid  . Panic attack     ativan prn  . Insomnia     restoril nightly    Past Surgical History: Past Surgical History  Procedure Laterality Date  . Pic line    . Esophagogastroduodenoscopy    . Feeding tube placed    . Gallbladder tube in place    . Tonsillectomy    . Wisdom teeth extracted     . Colonoscopy    . Bronchoscopy      multipe  . Tracheostomy    . Tracheostomy revision  05/04/2012    Procedure: TRACHEOSTOMY REVISION;  Surgeon: Serena Colonel, MD;  Location: [facility] OR;  Service: ENT;  Laterality: N/A;  Tracheostomy Change   . Gallbladder tube removal      Social History: History   Social History  . Marital Status: Married    Spouse Name: N/A    Number of Children: 2  . Years of Education: N/A   Occupational History  . disabled    Social History Main Topics  . Smoking status: Never Smoker   . Smokeless tobacco: Never Used  . Alcohol Use: No  . Drug Use: No  . Sexual Activity: No   Other Topics Concern  . None   Social History Narrative  . None    Family History: Family History  Problem Relation Age of Onset  .  Colon cancer      Allergies: Allergies  Allergen Reactions  . Amitriptyline Hcl     Elavil= unknown per care giver  . Cephalosporins     Unknown per care giver  . Compleat     Unknown per care giver  . Glutathione     Unknown per care giver  . Latex     Unknown per care giver  . Levofloxacin     Levaquin= unknown per care giver  . Rilutek [Riluzole]     Hives//SOB  . Sulfamethoxazole-Trimethoprim     Septra= unknown per care giver  . Cefdinir Rash  . Dicyclomine Hcl Rash    Current Facility-Administered Medications  Medication Dose Route Frequency Provider Last Rate Last Dose  . acetaminophen (TYLENOL) tablet 1,000 mg   1,000 mg Per Tube Q4H PRN Doree Albee, MD      . albuterol (PROVENTIL) (2.5 MG/3ML) 0.083% nebulizer solution 2.5 mg  2.5 mg Nebulization Q4H PRN Doree Albee, MD      . alum & mag hydroxide-simeth (MAALOX/MYLANTA) 200-200-20 MG/5ML suspension 15 mL  15 mL Per Tube Q6H PRN Doree Albee, MD      . aluminum hydroxide-magnesium carbonate (GAVISCON) 31.7-137 MG/5ML suspension 10-20 mL  10-20 mL Per Tube QID PRN Doree Albee, MD      . bacitracin-polymyxin b (POLYSPORIN) ointment 1 application  1 application Topical Daily Doree Albee, MD      . carbamide peroxide (DEBROX) 6.5 % otic solution 2-3 drop  2-3 drop Both Ears PRN Doree Albee, MD      . cetirizine HCl (Zyrtec) 5 MG/5ML syrup 5 mg  5 mg Per Tube Daily Doree Albee, MD      . clonazePAM Scarlette Calico) tablet 0.5 mg  0.5 mg Per Tube BID Doree Albee, MD      . Melene Muller ON 11/23/2013] cyanocobalamin ((VITAMIN B-12)) injection 1,000 mcg  1,000 mcg Intramuscular Once per day on Mon Thu Doree Albee, MD      . dextromethorphan (DELSYM) 30 MG/5ML liquid 60 mg  60 mg Per Tube BID PRN Doree Albee, MD      . diphenhydrAMINE (BENADRYL) 12.5 MG/5ML elixir 25 mg  25 mg Per Tube Q4H PRN Doree Albee, MD      . Melene Muller ON 11/22/2013] ferrous sulfate 220 (44 FE) MG/5ML solution 220 mg  220 mg Per Tube Q breakfast Doree Albee, MD      . heparin injection 5,000 Units  5,000 Units Subcutaneous 3 times per day Doree Albee, MD      . HYDROcodone-acetaminophen (NORCO/VICODIN) 5-325 MG per tablet 1 tablet  1 tablet Per Tube Q6H PRN Doree Albee, MD      . ipratropium (ATROVENT) nebulizer solution 0.5 mg  0.5 mg Nebulization Q6H PRN Doree Albee, MD      . ipratropium (ATROVENT) nebulizer solution 500 mcg  500 mcg Nebulization QID Doree Albee, MD      . lansoprazole (PREVACID) 3 mg/ml oral suspension 30 mg  30 mg Per Tube Daily Doree Albee, MD      . LORazepam (ATIVAN) tablet 1 mg  1 mg Per Tube Q4H PRN Doree Albee, MD      . magnesium hydroxide  (MILK OF MAGNESIA) suspension 5 mL  5 mL Per Tube Daily PRN Doree Albee, MD      . morphine (MSIR) tablet 15 mg  15 mg Per Tube Q6H PRN Doree Albee, MD      . MULTIVITAMIN & MINERAL LIQD 30 mL  30 mL  Per Tube Daily Doree Albee, MD      . nystatin (MYCOSTATIN) 100000 UNIT/ML suspension 500,000 Units  5 mL Oral QID PRN Doree Albee, MD      . Polyethyl Glycol-Propyl Glycol 0.4-0.3 % SOLN 1 drop  1 drop Both Eyes Q2H PRN Doree Albee, MD      . polyethylene glycol (MIRALAX / GLYCOLAX) packet 17-34 g  17-34 g Per Tube Daily PRN Doree Albee, MD      . polyethylene glycol powder (GLYCOLAX/MIRALAX) container 1 Container  1 Container Per Tube Daily Doree Albee, MD      . Pecola Lawless liquid 240 mL  240 mL Per Tube QID Doree Albee, MD      . temazepam (RESTORIL) capsule 30 mg  30 mg Per Tube QHS Doree Albee, MD      . vancomycin (VANCOCIN) 1,500 mg in sodium chloride 0.9 % 500 mL IVPB  1,500 mg Intravenous Once Lyndal Pulley, MD 250 mL/hr at 11/21/13 1816 1,500 mg at 11/21/13 1816  . venlafaxine (EFFEXOR) tablet 75-150 mg  75-150 mg Per Tube TID Doree Albee, MD      . vitamin B-12 (CYANOCOBALAMIN) tablet 1,000 mcg  1,000 mcg Per Tube Daily Doree Albee, MD       Current Outpatient Prescriptions  Medication Sig Dispense Refill  . acetaminophen (TYLENOL) 500 MG tablet Place 1,000 mg into feeding tube every 4 (four) hours as needed. For pain or fever.  Max 6 tablets per 24 hours      . albuterol (PROVENTIL) (2.5 MG/3ML) 0.083% nebulizer solution TAKE 3 MLS (2.5 MG TOTAL) BY NEBULIZATION EVERY 4 (FOUR) HOURS.  600 mL  11  . aluminum hydroxide-magnesium carbonate (GAVISCON) 31.7-137 MG/5ML suspension Place 10-20 mLs into feeding tube 4 (four) times daily as needed. For stomach upset or gas.      . bacitracin-polymyxin b (POLYSPORIN) ointment Apply 1 application topically daily. To G tube      . Calcium & Magnesium Carbonates (MYLANTA PO) Place 15 mLs into feeding tube daily as needed. Give per  package directions as needed for indigestion.      . carbamide peroxide (DEBROX) 6.5 % otic solution Place 2-3 drops in ear(s) as needed. For wax removal      . Cetirizine HCl (ZYRTEC) 5 MG/5ML SYRP Place 5 mg into feeding tube daily.       . ciprofloxacin (CIPRO) 750 MG tablet Take 1 tablet (750 mg total) by mouth 2 (two) times daily.  14 tablet  0  . clonazePAM (KLONOPIN) 0.5 MG tablet Place 0.5 mg into feeding tube 2 (two) times daily.       . [employer] CHEST CONGESTION RELIEF 400 MG TABS tablet CRUSH 3 TABLETS AND PLACE IN NG TUBE 2 TIMES A DAY  60 tablet  0  . cyanocobalamin (,VITAMIN B-12,) 1000 MCG/ML injection Inject 1,000 mcg into the muscle 2 (two) times a week. Inject on Mondays and Thursdays      . Dextromethorphan Polistirex (DELSYM PO) Place 10 mLs into feeding tube every 12 (twelve) hours as needed. For cough and congestion.      . diphenhydrAMINE (BENADRYL) 12.5 MG/5ML elixir Place 25 mg into feeding tube every 4 (four) hours as needed. For itching      . ferrous sulfate 220 (44 FE) MG/5ML solution GIVE PER G-TUBE ONCE DAILY  150 mL  3  . Fish Oil OIL Place 5 mLs into feeding tube 3 (three) times daily.       Donald Prose (  FLORA-Q) CAPS Place 1 capsule into feeding tube daily as needed. Only while on antibiotics      . HONEY PO Take 5 mLs by mouth daily.      Marland Kitchen. HYDROcodone-acetaminophen (VICODIN) 5-500 MG per tablet Take 1-2 tablets by mouth every 4 (four) hours as needed. For pain  30 tablet  0  . ipratropium (ATROVENT) 0.02 % nebulizer solution Take 500 mcg by nebulization 4 (four) times daily. at 1000, 1400, 1800, and 2200.      Marland Kitchen. ipratropium (ATROVENT) 0.02 % nebulizer solution USE 1 AMPULE 4 TIMES A DAY  1125 mL  11  . lansoprazole (PREVACID) 3 mg/ml SUSP oral suspension Place 30 mg into feeding tube daily.      . Lidocaine 4 % GEL Apply 1 application topically daily as needed. Apply to trach stoma site.      Marland Kitchen. LORazepam (ATIVAN) 1 MG tablet 1 mg by Feeding Tube route every 4  (four) hours as needed for anxiety. For anxiety      . magnesium hydroxide (MILK OF MAGNESIA) 400 MG/5ML suspension Place 5 mLs into feeding tube daily as needed. For constipation      . morphine (MSIR) 15 MG tablet Place 15 mg into feeding tube every 6 (six) hours as needed. For pain      . Multiple Vitamins-Minerals (MULTIVITAMIN & MINERAL) LIQD Place 30 mLs into feeding tube daily.       . Noni, Morinda citrifolia, (NONI JUICE PO) Place 30 mLs into feeding tube 2 (two) times daily.       . Nutritional Supplements (PULMOCARE) LIQD Place 240 mLs into feeding tube 4 (four) times daily. 1 can of pulmocare (240ml) mixed with 3 scoops of beneprotein at 10:30am and 8:30pm.   80ml with 3 scoop of beneprotein at 6am, 2pm, and 10pm. 1 can (240ml) with 2 scoops of beneprotein at 4:30pm      . nystatin (MYCOSTATIN) 100000 UNIT/ML suspension Take 5 mLs by mouth 4 (four) times daily as needed. Swab as needed for mouth sores      . Polyethyl Glycol-Propyl Glycol (SYSTANE) 0.4-0.3 % SOLN Place 1 drop into both eyes every 2 (two) hours as needed. For dry eyes      . polyethylene glycol (MIRALAX / GLYCOLAX) packet Place 17-34 g into feeding tube daily as needed. For constipation      . polyethylene glycol powder (GLYCOLAX/MIRALAX) powder TAKE 17 GM. IN 8 OZ. OF JUICE OR WATER. MAY USE AS MANY TIMES AS NEEDED FOR CONSTIPATION.  3689 g  2  . predniSONE 5 MG/5ML solution 2 tsp bid x 5 days  100 mL  0  . temazepam (RESTORIL) 30 MG capsule Place 30 mg into feeding tube at bedtime.       Marland Kitchen. venlafaxine (EFFEXOR) 75 MG tablet Place 75-150 mg into feeding tube 3 (three) times daily. Take 2 capsule daily at 0600, take 1 capsule daily at 1400, and take 2 capsules daily at 2200.  Mixed in 80 ml of pulmocare.      . vitamin B-12 (CYANOCOBALAMIN) 500 MCG tablet Place 1,000 mcg into feeding tube daily.       . Wheat Dextrin (BENEFIBER PO) Place 15 mLs into feeding tube daily. In 120 ml of water       Review Of Systems: 12 point  ROS negative except as noted above in HPI.  Physical Exam: Filed Vitals:   11/21/13 1845  BP: 172/96  Pulse: 106  Temp:   Resp: 19  General: unresponsive to commands, nonverbal  HEENT: PERRL mildly  Heart: S1, S2 normal, no murmur, rub or gallop, regular rate and rhythm Lungs: diffuse rhonchii and rales diffusely  Abdomen: abdomen is soft without significant tenderness, masses, organomegaly or guarding Extremities: extremities normal, atraumatic, no cyanosis or edema Skin:no rashes, no ecchymoses Neurology: normal without focal findings  Labs and Imaging: Lab Results  Component Value Date/Time   NA 134* 11/21/2013  4:53 PM   K 4.0 11/21/2013  4:53 PM   CL 96 11/21/2013  4:53 PM   CO2 24 11/21/2013  4:53 PM   BUN 12 11/21/2013  4:53 PM   CREATININE <0.20* 11/21/2013  4:53 PM   GLUCOSE 287* 11/21/2013  4:53 PM   Lab Results  Component Value Date   WBC 16.2* 11/21/2013   HGB 13.2 11/21/2013   HCT 40.5 11/21/2013   MCV 79.7 11/21/2013   PLT 314 11/21/2013   Urinalysis    Component Value Date/Time   COLORURINE YELLOW 11/21/2013 1718   APPEARANCEUR CLOUDY* 11/21/2013 1718   LABSPEC 1.018 11/21/2013 1718   PHURINE 7.0 11/21/2013 1718   GLUCOSEU >1000* 11/21/2013 1718   HGBUR NEGATIVE 11/21/2013 1718   BILIRUBINUR NEGATIVE 11/21/2013 1718   KETONESUR NEGATIVE 11/21/2013 1718   PROTEINUR NEGATIVE 11/21/2013 1718   UROBILINOGEN 1.0 11/21/2013 1718   NITRITE POSITIVE* 11/21/2013 1718   LEUKOCYTESUR NEGATIVE 11/21/2013 1718       Dg Chest Portable 1 View  11/21/2013   CLINICAL DATA:  Respiratory distress with congestion and shortness of breath.  EXAM: PORTABLE CHEST - 1 VIEW  COMPARISON:  Portable chest x-ray of May 04, 2012  FINDINGS: The lungs are reasonably well inflated. There are increased interstitial markings in the mid and lower lung zones which are not entirely new. There is partial obscuration of the right heart border. There is an endotracheal tube present whose tip lies  approximately 5 cm above the crotch of the carina. The retrocardiac region on the left is quite dense though this is not new. The mediastinum is normal in width. The observed portions of the bony thorax exhibit no acute abnormalities.  IMPRESSION: Increased interstitial and alveolar densities at both lung bases are not entirely new but are more conspicuous than in the past. These likely reflect pneumonia. There may be small amounts of pleural fluid at both lung bases. There is no evidence of CHF.   Electronically Signed   By: David  [place]   On: 11/21/2013 16:40   Dg Abd Portable 1v  11/21/2013   CLINICAL DATA:  One week history of abdominal distention.  EXAM: PORTABLE ABDOMEN - 1 VIEW  COMPARISON:  None.  FINDINGS: The bowel gas pattern is relatively nonspecific. There is no evidence of obstruction nor of an ileus. There is gas within the transverse portion of the colon. No significant small bowel gas is demonstrated. No free extraluminal gas collections are demonstrated.  IMPRESSION: The bowel gas pattern is nonspecific. There is no evidence of obstruction.   Electronically Signed   By: David  [place]   On: 11/21/2013 17:00     Assessment and Plan: [name] is a 58 y.o. year old male presenting with sepsis, HCAP, UTI   Sepsis: Likely secondary to HCAP and UTI. Will continue vanc and zosyn. Continue cipro (multiple medication allergies). Pan culture. Trend WBC. Tentatively planned for PICC line placement tomorrow.   Chronic Resp Failure: Continue home vent settings. Consult CCM (Cl[facility] pt). Frequent vent suctioning. Cont abx as above. Consider  pulm consult if sxs fail to improve despite treatment.   ALS: minimally functioning/nonverbal  at baseline. Continue to follow closely. Family and caregivers are very in tune to pt care and baseline.   FEN/GI: Continue home feeding tube regimen. PPI per tube.  Prophylaxis: sub q heparin Dispo:  Pending further evaluation  Code Status:DNR         Doree AlbeeSteven Xzaiver Vayda MD  Pager: [phone]

## 2013-11-21 NOTE — ED Notes (Signed)
Pt presents from home via [facility] with increased respiratory distress over the past week. Home health nurse reports increased respiratory rate and secretions x1week. Pt has ALS and is vented with a trach in place. Pt in non-communicative. Pt has home health nurse, Lynden AngVicky, and family members at bedside.

## 2013-11-21 NOTE — ED Notes (Signed)
Oral care kit at bedside at this time

## 2013-11-21 NOTE — ED Notes (Signed)
EDP at bedside  

## 2013-11-21 NOTE — Consult Note (Addendum)
Name: LERON STOFFERS MRN: [id number] DOB: December 08, 1955    ADMISSION DATE:  11/21/2013 CONSULTATION DATE:  11/21/2013  REFERRING MD :  Silverio Lay PRIMARY SERVICE:  Family Med  CHIEF COMPLAINT:  Hypoxia  BRIEF PATIENT DESCRIPTION: 58 year old male with ALS on with tracheostomy and chronic ventilation. OP of KC. Presented to ED with fevers, increased sputum production, and hypoxia x 1 week. In ED was febrile, hypotensive, and hypoxic. Patient requested to be sent home with PICC which is not able to be placed until 5/20 AM. PCCM consulted for vent management.   SIGNIFICANT EVENTS / STUDIES:  5/19 admit to [facility] for PNA  LINES / TUBES: Trach 04/2012 >>> PEG 05/2011 >>>  CULTURES: Blood 5/19 >>> Sputum 5/19 >>> Urine 5/19 >>>  ANTIBIOTICS: Cipro - 5/14 >>> Vanc - 5/19 >>> Zosyn - 5/19 >>>  HISTORY OF PRESENT ILLNESS:   58 year old male with ALS on with tracheostomy and chronic ventilation. OP of KC. Presented to ED with fevers, increased sputum production, and hypoxia x 1 week. In ED was febrile, hypotensive, and hypoxic. Patient requested to be sent home with PICC which is not able to be placed until 5/20 AM. PCCM consulted for vent management.   PAST MEDICAL HISTORY :  Past Medical History  Diagnosis Date  . ALS (amyotrophic lateral sclerosis)     vent dependent  . Ventilator dependent   . Pneumonia     hx of   . Feeding tube blocked   . History of IBS     miralax daily  . History of constipation   . Urinary incontinence     condom cath  . GERD (gastroesophageal reflux disease)     Lansoprazole-given through feeding tube  . Depression     given via tube Klonopin bid  . Panic attack     ativan prn  . Insomnia     restoril nightly   Past Surgical History  Procedure Laterality Date  . Pic line    . Esophagogastroduodenoscopy    . Feeding tube placed    . Gallbladder tube in place    . Tonsillectomy    . Wisdom teeth extracted     . Colonoscopy    . Bronchoscopy     multipe  . Tracheostomy    . Tracheostomy revision  05/04/2012    Procedure: TRACHEOSTOMY REVISION;  Surgeon: Serena Colonel, MD;  Location: [facility] OR;  Service: ENT;  Laterality: N/A;  Tracheostomy Change   . Gallbladder tube removal     Prior to Admission medications   Medication Sig Start Date End Date Taking? Authorizing Provider  acetaminophen (TYLENOL) 500 MG tablet Give 1,000 mg by tube every 4 (four) hours as needed. For pain or fever.  Max 6 tablets per 24 hours   Yes Historical Provider, MD  albuterol (PROVENTIL) (2.5 MG/3ML) 0.083% nebulizer solution Take 2.5 mg by nebulization every 4 (four) hours.   Yes Historical Provider, MD  ALOE VERA JUICE LIQD Give 60 mLs by tube 3 (three) times daily.   Yes Historical Provider, MD  bacitracin ointment Apply 1 application topically as needed (right arm scratch).   Yes Historical Provider, MD  baclofen (LIORESAL) 10 MG tablet Give 10 mg by tube 3 (three) times daily.   Yes Historical Provider, MD  ciprofloxacin (CIPRO) 750 MG tablet Give 750 mg by tube 2 (two) times daily.   Yes Historical Provider, MD  clonazePAM (KLONOPIN) 0.5 MG tablet Place 0.5 mg into feeding tube 2 (  two) times daily.    Yes Historical Provider, MD  desloratadine (CLARINEX) 0.5 MG/ML syrup Take 5 mg by mouth daily. 10ml dose   Yes Historical Provider, MD  ferrous sulfate 220 (44 FE) MG/5ML solution Place 220 mg into feeding tube daily with breakfast.   Yes Historical Provider, MD  Fish Oil OIL Place 5 mLs into feeding tube 3 (three) times daily.    Yes Historical Provider, MD  Flora-Q Donald Prose(FLORA-Q) CAPS Place 1 capsule into feeding tube daily as needed. Only while on antibiotics   Yes Historical Provider, MD  GuaiFENesin (MUCINEX PO) Take 1,200 mg by mouth. 3 tabs x 400mg =total dose   Yes Historical Provider, MD  HONEY PO Give 5 mLs by tube daily.    Yes Historical Provider, MD  HYDROcodone-acetaminophen (NORCO/VICODIN) 5-325 MG per tablet Take 1 tablet by mouth every 6 (six) hours as  needed for moderate pain.   Yes Historical Provider, MD  ipratropium (ATROVENT) 0.02 % nebulizer solution Take 500 mcg by nebulization 4 (four) times daily. at 1000, 1400, 1800, and 2200.   Yes Historical Provider, MD  LANSOPRAZOLE PO Give 30 mg by tube 2 (two) times daily. Conc: 30mg /305ml   Yes Historical Provider, MD  LORazepam (ATIVAN) 1 MG tablet 1 mg by Feeding Tube route every 4 (four) hours as needed for anxiety. For anxiety   Yes Historical Provider, MD  Multiple Vitamins-Minerals (MULTIVITAMIN & MINERAL) LIQD Place 30 mLs into feeding tube daily.    Yes Historical Provider, MD  mupirocin ointment (BACTROBAN) 2 % Apply 1 application topically daily as needed (for rash).  10/31/13  Yes Historical Provider, MD  Noni, Morinda citrifolia, (NONI JUICE PO) Place 30 mLs into feeding tube 2 (two) times daily.    Yes Historical Provider, MD  Nutritional Supplements (BOOST SMOOTHIE PO) Give 1 Container by tube daily. "yogurt smoothie"   Yes Historical Provider, MD  Nutritional Supplements (PULMOCARE) LIQD Place 240 mLs into feeding tube 4 (four) times daily. 1 can of pulmocare (240ml) mixed with 3 scoops of beneprotein at 10:30am and 8:30pm.   80ml with 3 scoop of beneprotein at 6am, 2pm, and 10pm. 1 can (240ml) with 2 scoops of beneprotein at 4:30pm   Yes Historical Provider, MD  Polyethyl Glycol-Propyl Glycol (SYSTANE) 0.4-0.3 % SOLN Apply 1 drop to eye every 2 (two) hours.   Yes Historical Provider, MD  polyethylene glycol (MIRALAX / GLYCOLAX) packet Give 17 g by tube daily. May give another dose as needed, For constipation   Yes Historical Provider, MD  temazepam (RESTORIL) 30 MG capsule Place 30 mg into feeding tube at bedtime.    Yes Historical Provider, MD  venlafaxine (EFFEXOR) 75 MG tablet Place 75-150 mg into feeding tube 3 (three) times daily. Take 2 capsule daily at 0600, take 1 capsule daily at 1400, and take 2 capsules daily at 2200.  Mixed in 80 ml of pulmocare.   Yes Historical Provider, MD   vitamin B-12 (CYANOCOBALAMIN) 500 MCG tablet Place 1,000 mcg into feeding tube daily.    Yes Historical Provider, MD  Wheat Dextrin (BENEFIBER PO) Place 15 mLs into feeding tube daily. In 120 ml of water   Yes Historical Provider, MD   Allergies  Allergen Reactions  . Rilutek [Riluzole] Hives, Shortness Of Breath, Itching and Other (See Comments)  . Amitriptyline Hcl Hives, Itching, Swelling and Rash  . Bentyl [Dicyclomine] Hives, Itching, Swelling and Rash  . Cefdinir Hives, Itching, Swelling and Rash  . Cephalosporins Hives, Itching, Swelling and Rash  .  Glutathione Hives, Itching, Swelling and Rash  . Jevity [Compleat] Other (See Comments)    GI upset symptoms-gas, bloating, upset stomach, etc.  . Latex Hives, Itching, Swelling and Rash  . Levofloxacin Hives, Itching, Swelling and Rash  . Prilosec [Omeprazole] Hives, Itching, Swelling and Rash  . Sulfamethoxazole-Trimethoprim Hives, Itching, Swelling and Rash    FAMILY HISTORY:  Family History  Problem Relation Age of Onset  . Colon cancer     SOCIAL HISTORY:  reports that he has never smoked. He has never used smokeless tobacco. He reports that he does not drink alcohol or use illicit drugs.  REVIEW OF SYSTEMS:  Gathered from family as patient is non-verbal  Bolds are positive  Constitutional: weight loss, gain, night sweats, Fevers, chills, fatigue .  HEENT: headaches, Sore throat, sneezing, nasal congestion, post nasal drip, Difficulty swallowing, Tooth/dental problems, visual complaints visual changes, ear ache CV:  chest pain, radiates: ,Orthopnea, PND, swelling in lower extremities, dizziness, palpitations, syncope.  GI  heartburn, indigestion, abdominal pain, nausea, vomiting, diarrhea, change in bowel habits, loss of appetite, bloody stools.  Resp: increased secretions, white, thick. hemoptysis, dyspnea, chest pain, pleuritic.  Skin: rash or itching or icterus GU: dysuria, change in color of urine, urgency or  frequency. flank pain, hematuria  MS: joint pain or swelling. decreased range of motion  Psych: change in mood or affect. depression or anxiety.  Neuro: difficulty with speech, weakness, numbness, ataxia    SUBJECTIVE:   VITAL SIGNS: Temp:  [99.7 F (37.6 C)-100.3 F (37.9 C)] 100.3 F (37.9 C) (05/19 2030) Pulse Rate:  [96-115] 106 (05/19 2230) Resp:  [18-27] 21 (05/19 2230) BP: (119-192)/(69-99) 133/74 mmHg (05/19 2230) SpO2:  [93 %-99 %] 97 % (05/19 2230) FiO2 (%):  [32 %] 32 % (05/19 2009) Weight:  [81.2 kg (179 lb 0.2 oz)] 81.2 kg (179 lb 0.2 oz) (05/19 1942)  PHYSICAL EXAMINATION: General:  Chronically ill male in mild distress Neuro:  Non-verbal, tracks with eyes. 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 Skin:  Intact.    Recent Labs Lab 11/21/13 1653 11/21/13 2031  NA 134*  --   K 4.0  --   CL 96  --   CO2 24  --   BUN 12  --   CREATININE <0.20* <0.20*  GLUCOSE 287*  --     Recent Labs Lab 11/21/13 1653 11/21/13 2031  HGB 13.2 12.4*  HCT 40.5 37.7*  WBC 16.2* 13.7*  PLT 314 352   Dg Chest Portable 1 View  11/21/2013   CLINICAL DATA:  Respiratory distress with congestion and shortness of breath.  EXAM: PORTABLE CHEST - 1 VIEW  COMPARISON:  Portable chest x-ray of May 04, 2012  FINDINGS: The lungs are reasonably well inflated. There are increased interstitial markings in the mid and lower lung zones which are not entirely new. There is partial obscuration of the right heart border. There is an endotracheal tube present whose tip lies approximately 5 cm above the crotch of the carina. The retrocardiac region on the left is quite dense though this is not new. The mediastinum is normal in width. The observed portions of the bony thorax exhibit no acute abnormalities.  IMPRESSION: Increased  interstitial and alveolar densities at both lung bases are not entirely new but are more conspicuous than in the past. These likely reflect pneumonia. There may  be small amounts of pleural fluid at both lung bases. There is no evidence of CHF.   Electronically Signed   By: David  [place]   On: 11/21/2013 16:40   Dg Abd Portable 1v  11/21/2013   CLINICAL DATA:  One week history of abdominal distention.  EXAM: PORTABLE ABDOMEN - 1 VIEW  COMPARISON:  None.  FINDINGS: The bowel gas pattern is relatively nonspecific. There is no evidence of obstruction nor of an ileus. There is gas within the transverse portion of the colon. No significant small bowel gas is demonstrated. No free extraluminal gas collections are demonstrated.  IMPRESSION: The bowel gas pattern is nonspecific. There is no evidence of obstruction.   Electronically Signed   By: David  [place]   On: 11/21/2013 17:00    ASSESSMENT / PLAN:  Acute on chronic respiratory failure secondary to CAP/ALS. CAP bilateral lungs, R>L via CXR Sepsis PNA is most likely source/UTI also possible. Has responded well to fluids.  ALS on home vent - tracheostomy  Rec's Patient may remain on home ventilator at typical settings (Vt of 600 , rate 16, 5 PEEP)  Start FiO2 at 100% and wean as tolerated to maintain SpO2 greater than 92% ABX per primary team. Consider d/c Cipro as has been on for 5 days with minimum benefit.  Hold atrovent from neb regimen to see if secretions will thin out Suctioning as needed per RN and RT Chest vest to help break up secretions Consider adding nebulized mucomyst to help with secretions.  Please schedule follow up with [facility] Dr Shelle Iron 5-7 days.   Plan is for patient to receive PICC tomorrow and then be discharged on IV ABX, per wishes of family. This is acceptable from pulmonary standpoint.   PCCM will see intermittently while in the hospital for assistance with vent management, if any changes occur please feel free to  contact PCCM with assistance.  Patient seen and examined, agree with above note.  I dictated the care and orders written for this patient under my direction.  Alyson Reedy, MD [phone]

## 2013-11-21 NOTE — ED Notes (Addendum)
Dr. Alvester MorinNewton made aware of reaction to Cipro and interventions taken, states to start new IV site and give IV benadryl.

## 2013-11-21 NOTE — ED Notes (Signed)
NOTIFIED DR. YAO IN PERSON FOR PATIENTS LAB RESULTS OF CG4+ LACTIC ACID ,17:16 PM ,11/21/2013.

## 2013-11-21 NOTE — ED Notes (Signed)
Medications requested from Pharmacy.

## 2013-11-21 NOTE — ED Notes (Signed)
MD at bedside. 

## 2013-11-21 NOTE — ED Notes (Signed)
Dr. Yao at bedside. 

## 2013-11-21 NOTE — ED Notes (Signed)
Suctioned pt's oral secretions and performed in-line suctioning on trach. Pt tolerated well.

## 2013-11-21 NOTE — Telephone Encounter (Signed)
Spoke with the pt's nurse Larene BeachVIckie  She states that the CIpro we called in for the pt on 11/16/13 is not helping  Still has thick, secretions white in color- taking mucinex bid Pt's hands are puffy and face is flushed  No fever  PIP this am 46  FBS this am 248  Please advise additional recs thanks!

## 2013-11-21 NOTE — ED Notes (Signed)
Contacted pharmacy regarding delay in medication delivery 

## 2013-11-21 NOTE — Consult Note (Signed)
ANTIBIOTIC CONSULT NOTE - INITIAL  Pharmacy Consult for Vancomycin, Zosyn, Cipro Indication: sepsis, HCAP, UTI  Allergies  Allergen Reactions  . Amitriptyline Hcl     Elavil= unknown per care giver  . Cephalosporins     Unknown per care giver  . Compleat     Unknown per care giver  . Glutathione     Unknown per care giver  . Latex     Unknown per care giver  . Levofloxacin     Levaquin= unknown per care giver  . Rilutek [Riluzole]     Hives//SOB  . Sulfamethoxazole-Trimethoprim     Septra= unknown per care giver  . Cefdinir Rash  . Dicyclomine Hcl Rash    Patient Measurements: Height: 6' 3.2" (191 cm) Weight: 179 lb 0.2 oz (81.2 kg) IBW/kg (Calculated) : 84.95  Vital Signs: Temp: 99.7 F (37.6 C) (05/19 1712) Temp src: Rectal (05/19 1712) BP: 133/79 mmHg (05/19 1942) Pulse Rate: 100 (05/19 1942) Intake/Output from previous day:   Intake/Output from this shift:    Labs:  Recent Labs  11/21/13 1653  WBC 16.2*  HGB 13.2  PLT 314  CREATININE <0.20*   Microbiology: No results found for this or any previous visit (from the past 720 hour(s)).  Medical History: Past Medical History  Diagnosis Date  . ALS (amyotrophic lateral sclerosis)     vent dependent  . Ventilator dependent   . Pneumonia     hx of   . Feeding tube blocked   . History of IBS     miralax daily  . History of constipation   . Urinary incontinence     condom cath  . GERD (gastroesophageal reflux disease)     Lansoprazole-given through feeding tube  . Depression     given via tube Klonopin bid  . Panic attack     ativan prn  . Insomnia     restoril nightly   Assessment: 57yom with end stage ALS on a vent with a trach presents to the ED with fevers and increased secretions over the past week. He will begin empiric antibiotics for sepsis, HCAP, UTI. SCr is <0.2 due to patient's debilitated state, so CrCl likely overestimated. He received 1.5g vancomycin in the ED at 1816 and 3.375g  zosyn at 1731.  Goal of Therapy:  Vancomycin trough level 15-20 mcg/ml  Plan:  1) Vancomycin 1g IV q12 - check a trough as soon as possible  2) Zosyn 3.375g IV q8 3) Cipro 400mg  IV q12  Hessie DienerJennifer Sue [facility] Hospital[facility] 11/21/2013,8:20 PM

## 2013-11-21 NOTE — Telephone Encounter (Signed)
Nurse called back said pt was 24 breaths per min she has bagged him twice not any better per [facility] recommendations told the nurse to take him to the er

## 2013-11-21 NOTE — ED Provider Notes (Signed)
CSN: [id number]     Arrival date & time 11/21/13  1517 History   First MD Initiated Contact with Patient 11/21/13 1547     Chief Complaint  Patient presents with  . Respiratory Distress     (Consider location/radiation/quality/duration/timing/severity/associated sxs/prior Treatment) Patient is a 58 y.o. male presenting with shortness of breath. The history is provided by a relative and a caregiver.  Shortness of Breath Severity:  Moderate Onset quality:  Gradual Duration:  1 week Timing:  Constant Progression:  Worsening Chronicity:  New Context comment:  Increased secretions Relieved by:  Nothing Worsened by:  Nothing tried Ineffective treatments:  None tried Associated symptoms: fever and sputum production   Risk factors: prolonged immobilization     Past Medical History  Diagnosis Date  . ALS (amyotrophic lateral sclerosis)     vent dependent  . Ventilator dependent   . Pneumonia     hx of   . Feeding tube blocked   . History of IBS     miralax daily  . History of constipation   . Urinary incontinence     condom cath  . GERD (gastroesophageal reflux disease)     Lansoprazole-given through feeding tube  . Depression     given via tube Klonopin bid  . Panic attack     ativan prn  . Insomnia     restoril nightly   Past Surgical History  Procedure Laterality Date  . Pic line    . Esophagogastroduodenoscopy    . Feeding tube placed    . Gallbladder tube in place    . Tonsillectomy    . Wisdom teeth extracted     . Colonoscopy    . Bronchoscopy      multipe  . Tracheostomy    . Tracheostomy revision  05/04/2012    Procedure: TRACHEOSTOMY REVISION;  Surgeon: Serena Colonel, MD;  Location: [facility] OR;  Service: ENT;  Laterality: N/A;  Tracheostomy Change   . Gallbladder tube removal     Family History  Problem Relation Age of Onset  . Colon cancer     History  Substance Use Topics  . Smoking status: Never Smoker   . Smokeless tobacco: Never Used  . Alcohol  Use: No    Review of Systems  Constitutional: Positive for fever.  Respiratory: Positive for sputum production and shortness of breath.   All other systems reviewed and are negative.     Allergies  Amitriptyline hcl; Cephalosporins; Compleat; Glutathione; Latex; Levofloxacin; Rilutek; Sulfamethoxazole-trimethoprim; Cefdinir; and Dicyclomine hcl  Home Medications   Prior to Admission medications   Medication Sig Start Date End Date Taking? Authorizing Provider  acetaminophen (TYLENOL) 500 MG tablet Place 1,000 mg into feeding tube every 4 (four) hours as needed. For pain or fever.  Max 6 tablets per 24 hours    Historical Provider, MD  albuterol (PROVENTIL) (2.5 MG/3ML) 0.083% nebulizer solution TAKE 3 MLS (2.5 MG TOTAL) BY NEBULIZATION EVERY 4 (FOUR) HOURS. 02/15/13   Barbaraann Share, MD  aluminum hydroxide-magnesium carbonate (GAVISCON) 31.7-137 MG/5ML suspension Place 10-20 mLs into feeding tube 4 (four) times daily as needed. For stomach upset or gas.    Historical Provider, MD  bacitracin-polymyxin b (POLYSPORIN) ointment Apply 1 application topically daily. To G tube    Historical Provider, MD  Calcium & Magnesium Carbonates (MYLANTA PO) Place 15 mLs into feeding tube daily as needed. Give per package directions as needed for indigestion.    Historical Provider, MD  carbamide peroxide (DEBROX) 6.5 %  otic solution Place 2-3 drops in ear(s) as needed. For wax removal    Historical Provider, MD  Cetirizine HCl (ZYRTEC) 5 MG/5ML SYRP Place 5 mg into feeding tube daily.     Historical Provider, MD  ciprofloxacin (CIPRO) 750 MG tablet Take 1 tablet (750 mg total) by mouth 2 (two) times daily. 11/16/13   Barbaraann Share, MD  clonazePAM (KLONOPIN) 0.5 MG tablet Place 0.5 mg into feeding tube 2 (two) times daily.     Historical Provider, MD  [employer] CHEST CONGESTION RELIEF 400 MG TABS tablet CRUSH 3 TABLETS AND PLACE IN NG TUBE 2 TIMES A DAY 08/03/13   Barbaraann Share, MD  cyanocobalamin (,VITAMIN  B-12,) 1000 MCG/ML injection Inject 1,000 mcg into the muscle 2 (two) times a week. Inject on Mondays and Thursdays    Historical Provider, MD  Dextromethorphan Polistirex (DELSYM PO) Place 10 mLs into feeding tube every 12 (twelve) hours as needed. For cough and congestion.    Historical Provider, MD  diphenhydrAMINE (BENADRYL) 12.5 MG/5ML elixir Place 25 mg into feeding tube every 4 (four) hours as needed. For itching    Historical Provider, MD  ferrous sulfate 220 (44 FE) MG/5ML solution GIVE PER G-TUBE ONCE DAILY 08/23/13   Louis Meckel, MD  Fish Oil OIL Place 5 mLs into feeding tube 3 (three) times daily.     Historical Provider, MD  Flora-Q Donald Prose) CAPS Place 1 capsule into feeding tube daily as needed. Only while on antibiotics    Historical Provider, MD  HONEY PO Take 5 mLs by mouth daily.    Historical Provider, MD  HYDROcodone-acetaminophen (VICODIN) 5-500 MG per tablet Take 1-2 tablets by mouth every 4 (four) hours as needed. For pain 07/12/13   Louis Meckel, MD  ipratropium (ATROVENT) 0.02 % nebulizer solution Take 500 mcg by nebulization 4 (four) times daily. at 1000, 1400, 1800, and 2200.    Historical Provider, MD  ipratropium (ATROVENT) 0.02 % nebulizer solution USE 1 AMPULE 4 TIMES A DAY 02/15/13   Barbaraann Share, MD  lansoprazole (PREVACID) 3 mg/ml SUSP oral suspension Place 30 mg into feeding tube daily.    Historical Provider, MD  Lidocaine 4 % GEL Apply 1 application topically daily as needed. Apply to trach stoma site.    Historical Provider, MD  LORazepam (ATIVAN) 1 MG tablet 1 mg by Feeding Tube route every 4 (four) hours as needed for anxiety. For anxiety    Historical Provider, MD  magnesium hydroxide (MILK OF MAGNESIA) 400 MG/5ML suspension Place 5 mLs into feeding tube daily as needed. For constipation    Historical Provider, MD  morphine (MSIR) 15 MG tablet Place 15 mg into feeding tube every 6 (six) hours as needed. For pain    Historical Provider, MD  Multiple  Vitamins-Minerals (MULTIVITAMIN & MINERAL) LIQD Place 30 mLs into feeding tube daily.     Historical Provider, MD  Noni, Morinda citrifolia, (NONI JUICE PO) Place 30 mLs into feeding tube 2 (two) times daily.     Historical Provider, MD  Nutritional Supplements (PULMOCARE) LIQD Place 240 mLs into feeding tube 4 (four) times daily. 1 can of pulmocare ( ) mixed with 3 scoops of beneprotein at 10:30am and 8:30pm.   80ml with 3 scoop of beneprotein at 6am, 2pm, and 10pm. 1 can ( ) with 2 scoops of beneprotein at 4:30pm    Historical Provider, MD  nystatin (MYCOSTATIN) 100000 UNIT/ML suspension Take 5 mLs by mouth 4 (four) times daily as needed.  Swab as needed for mouth sores    Historical Provider, MD  Polyethyl Glycol-Propyl Glycol (SYSTANE) 0.4-0.3 % SOLN Place 1 drop into both eyes every 2 (two) hours as needed. For dry eyes    Historical Provider, MD  polyethylene glycol (MIRALAX / GLYCOLAX) packet Place 17-34 g into feeding tube daily as needed. For constipation    Historical Provider, MD  polyethylene glycol powder (GLYCOLAX/MIRALAX) powder TAKE 17 GM. IN 8 OZ. OF JUICE OR WATER. MAY USE AS MANY TIMES AS NEEDED FOR CONSTIPATION.    Louis Meckelobert D Kaplan, MD  predniSONE 5 MG/5ML solution 2 tsp bid x 5 days 02/06/13   Michele McalpineScott M Nadel, MD  temazepam (RESTORIL) 30 MG capsule Place 30 mg into feeding tube at bedtime.     Historical Provider, MD  venlafaxine (EFFEXOR) 75 MG tablet Place 75-150 mg into feeding tube 3 (three) times daily. Take 2 capsule daily at 0600, take 1 capsule daily at 1400, and take 2 capsules daily at 2200.  Mixed in 80 ml of pulmocare.    Historical Provider, MD  vitamin B-12 (CYANOCOBALAMIN) 500 MCG tablet Place 1,000 mcg into feeding tube daily.     Historical Provider, MD  Wheat Dextrin (BENEFIBER PO) Place 15 mLs into feeding tube daily. In 120 ml of water    Historical Provider, MD   BP 179/88  Pulse 114  Temp(Src) 100.1 F (37.8 C) (Temporal)  Resp 22  SpO2 95% Physical  Exam  Constitutional: He appears listless. He has a sickly appearance. No distress.  HENT:  Head: Normocephalic and atraumatic.  Eyes: Conjunctivae are normal.  Neck: Neck supple. No tracheal deviation present.  Cardiovascular: Regular rhythm.   Abdominal: Soft. He exhibits no distension.  Neurological: He appears listless. He exhibits abnormal muscle tone (flaccid).  Skin: Skin is warm and dry. No rash noted.  Psychiatric:  Unable to assess as Pt unable to communicate    ED Course  Procedures (including critical care time) Labs Review Labs Reviewed  I-STAT ARTERIAL BLOOD GAS, ED - Abnormal; Notable for the following:    pO2, Arterial 101.0 (*)    Bicarbonate 28.4 (*)    Acid-Base Excess 4.0 (*)    All other components within normal limits  CBC WITH DIFFERENTIAL  COMPREHENSIVE METABOLIC PANEL  LIPASE, BLOOD  URINALYSIS, ROUTINE W REFLEX MICROSCOPIC  I-STAT CG4 LACTIC ACID, ED    Imaging Review No results found.   EKG Interpretation   Date/Time:  Tuesday Nov 21 2013 15:26:06 EDT Ventricular Rate:  115 PR Interval:  161 QRS Duration: 84 QT Interval:  333 QTC Calculation: 461 R Axis:   11 Text Interpretation:  Sinus tachycardia Left atrial enlargement Minimal ST  depression Baseline wander in lead(s) V1 No significant change was found  Confirmed by CAMPOS  MD, Caryn BeeKEVIN ([id number]) on 11/21/2013 3:42:22 PM      MDM   Final diagnoses:  None   58 y.o. male presents with increased vent settings over the last week. 2 weeks ago he began to have increased secretions and had needed more frequent suctioning. He had a fever that was noted by his home nursing care to a level of 100.6. After discussion with the patient's physician he was placed on ciprofloxacin. His vent settings continued to increase with rates from 18-20, increased need for suctioning, and peak pressures over 50. These are abnormal things for him. He does have a history of prior mucous plugging which may be  contributing. Patient has a temporal temperature of 100.1  with tachycardia on arrival so we'll get a rectal temp to gauge the patient is febrile and continue workup for possible healthcare associated pneumonia.  Chest x-ray concerning for HCAP, will cover with vancomycin and Zosyn for resistant organisms and discuss options with the family between discharge and set up for a PICC line or admit to the hospital for further monitoring and coordination of outpatient care.  Unable to schedule PIC line placement in the emergency department with interventional radiology so will admit to the hospitalist service for initial doses of antibiotics and PICC line placement prior to discharge for continued doses of HCAP ABx  Lyndal Pulleyaniel Emry Tobin, MD 11/21/13 1952

## 2013-11-22 DIAGNOSIS — J189 Pneumonia, unspecified organism: Secondary | ICD-10-CM

## 2013-11-22 DIAGNOSIS — A419 Sepsis, unspecified organism: Secondary | ICD-10-CM | POA: Diagnosis not present

## 2013-11-22 DIAGNOSIS — Z9911 Dependence on respirator [ventilator] status: Secondary | ICD-10-CM

## 2013-11-22 DIAGNOSIS — Z930 Tracheostomy status: Secondary | ICD-10-CM

## 2013-11-22 DIAGNOSIS — J690 Pneumonitis due to inhalation of food and vomit: Secondary | ICD-10-CM

## 2013-11-22 LAB — LEGIONELLA ANTIGEN, URINE: Legionella Antigen, Urine: NEGATIVE

## 2013-11-22 LAB — CBC WITH DIFFERENTIAL/PLATELET
BASOS ABS: 0.1 10*3/uL (ref 0.0–0.1)
BASOS PCT: 0 % (ref 0–1)
Eosinophils Absolute: 0.1 10*3/uL (ref 0.0–0.7)
Eosinophils Relative: 1 % (ref 0–5)
HCT: 39.2 % (ref 39.0–52.0)
Hemoglobin: 12.6 g/dL — ABNORMAL LOW (ref 13.0–17.0)
Lymphocytes Relative: 6 % — ABNORMAL LOW (ref 12–46)
Lymphs Abs: 1.1 10*3/uL (ref 0.7–4.0)
MCH: 25.5 pg — ABNORMAL LOW (ref 26.0–34.0)
MCHC: 32.1 g/dL (ref 30.0–36.0)
MCV: 79.2 fL (ref 78.0–100.0)
MONO ABS: 1.5 10*3/uL — AB (ref 0.1–1.0)
Monocytes Relative: 8 % (ref 3–12)
NEUTROS PCT: 85 % — AB (ref 43–77)
Neutro Abs: 15.1 10*3/uL — ABNORMAL HIGH (ref 1.7–7.7)
Platelets: 289 10*3/uL (ref 150–400)
RBC: 4.95 MIL/uL (ref 4.22–5.81)
RDW: 17.8 % — AB (ref 11.5–15.5)
WBC: 17.9 10*3/uL — AB (ref 4.0–10.5)

## 2013-11-22 LAB — GLUCOSE, CAPILLARY
GLUCOSE-CAPILLARY: 196 mg/dL — AB (ref 70–99)
GLUCOSE-CAPILLARY: 273 mg/dL — AB (ref 70–99)
Glucose-Capillary: 235 mg/dL — ABNORMAL HIGH (ref 70–99)
Glucose-Capillary: 232 mg/dL — ABNORMAL HIGH (ref 70–99)
Glucose-Capillary: 277 mg/dL — ABNORMAL HIGH (ref 70–99)

## 2013-11-22 LAB — COMPREHENSIVE METABOLIC PANEL
ALK PHOS: 125 U/L — AB (ref 39–117)
ALT: 32 U/L (ref 0–53)
AST: 40 U/L — ABNORMAL HIGH (ref 0–37)
Albumin: 3 g/dL — ABNORMAL LOW (ref 3.5–5.2)
BUN: 11 mg/dL (ref 6–23)
CALCIUM: 9 mg/dL (ref 8.4–10.5)
CO2: 25 meq/L (ref 19–32)
Chloride: 100 mEq/L (ref 96–112)
Creatinine, Ser: <0.2 mg/dL — ABNORMAL LOW (ref 0.50–1.35)
GLUCOSE: 230 mg/dL — AB (ref 70–99)
POTASSIUM: 4.3 meq/L (ref 3.7–5.3)
Sodium: 139 mEq/L (ref 137–147)
Total Bilirubin: 0.4 mg/dL (ref 0.3–1.2)
Total Protein: 7.8 g/dL (ref 6.0–8.3)

## 2013-11-22 LAB — STREP PNEUMONIAE URINARY ANTIGEN: STREP PNEUMO URINARY ANTIGEN: NEGATIVE

## 2013-11-22 LAB — HEMOGLOBIN A1C
Hgb A1c MFr Bld: 7.1 % — ABNORMAL HIGH (ref <5.7)
Mean Plasma Glucose: 157 mg/dL — ABNORMAL HIGH (ref <117)

## 2013-11-22 LAB — MRSA PCR SCREENING: MRSA by PCR: NEGATIVE

## 2013-11-22 MED ORDER — BENEPROTEIN PO POWD
1.0000 | Freq: Three times a day (TID) | ORAL | Status: DC
  Administered 2013-11-22 – 2013-11-24 (×6): 6 g via ORAL
  Filled 2013-11-22 (×2): qty 227

## 2013-11-22 MED ORDER — SODIUM CHLORIDE 0.9 % IJ SOLN
10.0000 mL | Freq: Two times a day (BID) | INTRAMUSCULAR | Status: DC
  Administered 2013-11-23 (×2): 10 mL

## 2013-11-22 MED ORDER — BIOTENE DRY MOUTH MT LIQD
15.0000 mL | Freq: Four times a day (QID) | OROMUCOSAL | Status: DC
  Administered 2013-11-22 – 2013-11-24 (×9): 15 mL via OROMUCOSAL

## 2013-11-22 MED ORDER — CHLORHEXIDINE GLUCONATE 0.12 % MT SOLN
15.0000 mL | Freq: Two times a day (BID) | OROMUCOSAL | Status: DC
  Administered 2013-11-22 – 2013-11-24 (×5): 15 mL via OROMUCOSAL
  Filled 2013-11-22 (×7): qty 15

## 2013-11-22 MED ORDER — SODIUM CHLORIDE 0.9 % IJ SOLN: 10.0000 mL | INTRAMUSCULAR | Status: DC | PRN

## 2013-11-22 MED ORDER — PULMOCARE PO LIQD
240.0000 mL | ORAL | Status: DC
  Administered 2013-11-22 – 2013-11-23 (×4): 240 mL
  Administered 2013-11-24: 237 mL
  Filled 2013-11-22: qty 1000

## 2013-11-22 NOTE — Care Management Note (Addendum)
    Page 1 of 2   11/24/2013     11:04:07 AM CARE MANAGEMENT NOTE 11/24/2013  Patient:  [name],[name]   Account Number:  [id number]  Date Initiated:  11/22/2013  Documentation initiated by:  MAYO,HENRIETTA  Subjective/Objective Assessment:   dx sepsis; chronic trach/vent/PEG; lives with spouse, has 24/7 caregivers through [facility]; [employer] [facility] provides home vent     Action/Plan:   Anticipated DC Date:     Anticipated DC Plan:        DC Planning Services  CM consult      Choice offered to / List presented to:  C-3 Spouse   DME arranged  IV PUMP/EQUIPMENT      DME agency  [facility]     [facility]  [facility] RN  IV Antibiotics      HH agency  [facility]   Status of service:   Medicare Important Message given?   (If response is "NO", the following Medicare IM given date fields will be blank) Date Medicare IM given:   Date Additional Medicare IM given:    Discharge Disposition:  HOME W HOME HEALTH SERVICES  Per UR Regulation:  Reviewed for med. necessity/level of care/duration of stay  If discussed at Long Length of Stay Meetings, dates discussed:    Comments:  5/22  1102 debbie Jasin Brazel rn,bsn spoke w wife and pt's nse. they [facility] home. [facility] alerted [facility] of dc today. will get dc summary to [facility] that provides 24hr nsg care.  11/22/13 1328 Henrietta Mayo RN MSN BSN CCM Pt will need IV antibx - provided list of agencies and spouse chose [facility], states they provided IV antibx for pt's in the past.  Liaison notified.

## 2013-11-22 NOTE — Progress Notes (Signed)
Peripherally Inserted Central Catheter/Midline Placement  The IV Nurse has discussed with the patient and/or persons authorized to consent for the patient, the purpose of this procedure and the potential benefits and risks involved with this procedure.  The benefits include less needle sticks, lab draws from the catheter and patient may be discharged home with the catheter.  Risks include, but not limited to, infection, bleeding, blood clot (thrombus formation), and puncture of an artery; nerve damage and irregular heat beat.  Alternatives to this procedure were also discussed.  PICC/Midline Placement Documentation  PICC / Midline Single Lumen Right Basilic (Active)       Timmothy SoursLora Renee Tu Shimmel 11/22/2013, 12:15 PM

## 2013-11-22 NOTE — ED Notes (Signed)
Family at bedside, as well as his Engineer, maintenanceprivate nurse.

## 2013-11-22 NOTE — Progress Notes (Signed)
Pt did not have chest vest at 1200

## 2013-11-22 NOTE — ED Provider Notes (Signed)
I saw and evaluated the patient, reviewed the resident's note and I agree with the findings and plan.   EKG Interpretation   Date/Time:  Tuesday Nov 21 2013 15:26:06 EDT Ventricular Rate:  115 PR Interval:  161 QRS Duration: 84 QT Interval:  333 QTC Calculation: 461 R Axis:   11 Text Interpretation:  Sinus tachycardia Left atrial enlargement Minimal ST  depression Baseline wander in lead(s) V1 No significant change was found  Confirmed by CAMPOS  MD, Caryn BeeKEVIN ([id number]) on 11/21/2013 3:42:22 PM      [name] is a 58 y.o. male hx of ALS with trach on a vent at baseline here with congestion, trouble breathing. Congestion, requiring more frequent suctioning for several days. Placed on cipro since Friday. Today, he had more secretion and had increased PIP on the vent and required manual bagging so EMS was called and he came to the ED. Chronically ill, febrile, tachycardic on exam. Dec breath sounds bilateral bases. CXR showed pneumonia, WBC 15. Patient's family understands poor prognosis and wants him to go home with PICC line on IV abx. Given vanc/zosyn. I called Interventional radiologist but its off hours so unable to set it up. Family wants patient admitted for IV abx and get PICC line tomorrow. Will admit.    Richardean Canalavid H Yao, MD 11/22/13 [phone]

## 2013-11-22 NOTE — Progress Notes (Signed)
[facility] PROGRESS NOTE  [name] ZOX:[id number] DOB: 1955-12-28 DOA: 11/21/2013 PCP: Stefani Dama, MD  Assessment/Plan: 1. Aspiration pneumonia versus healthcare associated pneumonia -Patient presented with clinical signs symptoms suggestive of pneumonia, chest x-ray on admission showing alveolar densities at both lung bases appear to be more conspicuous on past studies, could reflect pneumonia. Given history of ALS aspiration pneumonia possibility. -Will continue empiric IV antimicrobial therapy with Zosyn and vancomycin -Patient to receive PICC line placement, anticipate discharge tomorrow with IV antimicrobial therapy.  2.  Chronic hypoxemic respiratory failure secondary to ALS -Pulmonary critical care consultation -Recommending patient remained on home ventilator with typical settings, tidal volume 600, rate 16, PEEP 5.  3. Sepsis -Present on admission, evidenced by a white count of 17,900, heart rate of 115, respiratory of 27 -Source of infection likely to be lungs from aspiration pneumonia -Continue broad-spectrum IV antimicrobial  4. Nutrition -Continue tube feeds   Code Status: DO NOT RESUSCITATE Family Communication: Spoke with wife present at bedside Disposition Plan: Plan for PICC line placement receiving, anticipate discharge with IV antibiotic therapy in a.m.   Consultants:  : Critical care medicine  Procedures:  PICC line placement  Antibiotics:  Vancomycin  Zosyn  HPI/Subjective: Patient is a pleasant 58 year old gentleman with a past medical history of ALS, chronic respiratory failure on home vent, admitted to the medicine service on 11/21/2013 presenting with fevers, increased sputum production, suspect secondary to aspiration pneumonia. He was started on broad-spectrum IV antibiotic therapy with vancomycin and Zosyn.  Objective: Filed Vitals:   11/22/13 1302  BP: 159/87  Pulse: 111  Temp: 98.9 F (37.2 C)  Resp: 23     Intake/Output Summary (Last 24 hours) at 11/22/13 1538 Last data filed at 11/22/13 1303  Gross per 24 hour  Intake    680 ml  Output   1100 ml  Net   -420 ml   Filed Weights   11/21/13 1942  Weight: 81.2 kg (179 lb 0.2 oz)    Exam:   General:  Ill-appearing, no acute distress. He is nonverbal not following commands  Cardiovascular: Tachycardic, regular rhythm normal S1-S2  Respiratory: Coarse respiratory sounds, by lateral crackles, rhonchi, status post trach collar placed  Abdomen: Soft nontender as  Musculoskeletal: Trace edema to extremities  Data Reviewed: Basic Metabolic Panel:  Recent Labs Lab 11/21/13 1653 11/21/13 2031 11/22/13 0445  NA 134*  --  139  K 4.0  --  4.3  CL 96  --  100  CO2 24  --  25  GLUCOSE 287*  --  230*  BUN 12  --  11  CREATININE <0.20* <0.20* <0.20*  CALCIUM 9.6  --  9.0   Liver Function Tests:  Recent Labs Lab 11/21/13 1653 11/22/13 0445  AST 29 40*  ALT 37 32  ALKPHOS 130* 125*  BILITOT 0.4 0.4  PROT 8.6* 7.8  ALBUMIN 3.2* 3.0*    Recent Labs Lab 11/21/13 1653  LIPASE 38   No results found for this basename: AMMONIA,  in the last 168 hours CBC:  Recent Labs Lab 11/21/13 1653 11/21/13 2031 11/22/13 0445  WBC 16.2* 13.7* 17.9*  NEUTROABS 13.9*  --  15.1*  HGB 13.2 12.4* 12.6*  HCT 40.5 37.7* 39.2  MCV 79.7 79.0 79.2  PLT 314 352 289   Cardiac Enzymes: No results found for this basename: CKTOTAL, CKMB, CKMBINDEX, TROPONINI,  in the last 168 hours BNP (last 3 results) No results found for this basename: PROBNP,  in the  last 8760 hours CBG:  Recent Labs Lab 11/21/13 2236 11/22/13 0439 11/22/13 0741 11/22/13 1256  GLUCAP 179* 235* 232* 277*    Recent Results (from the past 240 hour(s))  MRSA PCR SCREENING     Status: None   Collection Time    11/22/13  2:07 AM      Result Value Ref Range Status   MRSA by PCR NEGATIVE  NEGATIVE Final   Comment:            The GeneXpert MRSA Assay (FDA      approved for NASAL specimens     only), is one component of a     comprehensive MRSA colonization     surveillance program. It is not     intended to diagnose MRSA     infection nor to guide or     monitor treatment for     MRSA infections.     Studies: Dg Chest Portable 1 View  11/21/2013   CLINICAL DATA:  Respiratory distress with congestion and shortness of breath.  EXAM: PORTABLE CHEST - 1 VIEW  COMPARISON:  Portable chest x-ray of May 04, 2012  FINDINGS: The lungs are reasonably well inflated. There are increased interstitial markings in the mid and lower lung zones which are not entirely new. There is partial obscuration of the right heart border. There is an endotracheal tube present whose tip lies approximately 5 cm above the crotch of the carina. The retrocardiac region on the left is quite dense though this is not new. The mediastinum is normal in width. The observed portions of the bony thorax exhibit no acute abnormalities.  IMPRESSION: Increased interstitial and alveolar densities at both lung bases are not entirely new but are more conspicuous than in the past. These likely reflect pneumonia. There may be small amounts of pleural fluid at both lung bases. There is no evidence of CHF.   Electronically Signed   By: David  [place]   On: 11/21/2013 16:40   Dg Abd Portable 1v  11/21/2013   CLINICAL DATA:  One week history of abdominal distention.  EXAM: PORTABLE ABDOMEN - 1 VIEW  COMPARISON:  None.  FINDINGS: The bowel gas pattern is relatively nonspecific. There is no evidence of obstruction nor of an ileus. There is gas within the transverse portion of the colon. No significant small bowel gas is demonstrated. No free extraluminal gas collections are demonstrated.  IMPRESSION: The bowel gas pattern is nonspecific. There is no evidence of obstruction.   Electronically Signed   By: David  [place]   On: 11/21/2013 17:00    Scheduled Meds: . albuterol  2.5 mg Nebulization Q4H  .  antiseptic oral rinse  15 mL Mouth Rinse QID  . chlorhexidine  15 mL Mouth Rinse BID  . clonazePAM  0.5 mg Per Tube BID  . ferrous sulfate  300 mg Per Tube Q breakfast  . heparin  5,000 Units Subcutaneous 3 times per day  . insulin aspart  0-9 Units Subcutaneous 6 times per day  . multivitamin  5 mL Oral Daily  . pantoprazole sodium  40 mg Per Tube BID  . piperacillin-tazobactam (ZOSYN)  IV  3.375 g Intravenous 3 times per day  . polyethylene glycol  17 g Per Tube Daily  . protein supplement  1 scoop Oral TID WC  . PULMOCARE  240 mL Per Tube 3 times per day  . sodium chloride  10-40 mL Intracatheter Q12H  . temazepam  30 mg Per Tube  QHS  . vancomycin  1,000 mg Intravenous Q12H  . venlafaxine  150 mg Oral 2 times per day  . venlafaxine  75 mg Per Tube Q24 Hr x 2  . vitamin B-12  1,000 mcg Per Tube Daily   Continuous Infusions:   Active Problems:   Sepsis    Time spent: 35 min    Jeralyn BennettEzequiel Carrera Kiesel  [facility] Pager [phone]. If 7PM-7AM, please contact night-coverage at [email or web address], password [email or web address] 11/22/2013, 3:38 PM  LOS: 1 day

## 2013-11-22 NOTE — Progress Notes (Signed)
Pt. Was transported to 2C09 without any complications.

## 2013-11-22 NOTE — Progress Notes (Signed)
[facility]  Patient Status: Active pt with [facility] prior to this admission  [facility] is providing the following services: RT for home trach, vent, [facility] for Home IV ABX and HHRN if needed for home IV ABX. [facility] follow and support transition home when deemed appropriate by MD.   If patient discharges after hours, please call [phone].   [name] 11/22/2013, 3:25 PM

## 2013-11-23 ENCOUNTER — Inpatient Hospital Stay (HOSPITAL_COMMUNITY): Payer: Medicare Other

## 2013-11-23 LAB — CBC WITH DIFFERENTIAL/PLATELET
Basophils Absolute: 0.1 10*3/uL (ref 0.0–0.1)
Basophils Relative: 0 % (ref 0–1)
EOS ABS: 0.1 10*3/uL (ref 0.0–0.7)
EOS PCT: 0 % (ref 0–5)
HCT: 36 % — ABNORMAL LOW (ref 39.0–52.0)
Hemoglobin: 11.6 g/dL — ABNORMAL LOW (ref 13.0–17.0)
Lymphocytes Relative: 6 % — ABNORMAL LOW (ref 12–46)
Lymphs Abs: 1.1 10*3/uL (ref 0.7–4.0)
MCH: 25.8 pg — AB (ref 26.0–34.0)
MCHC: 32.2 g/dL (ref 30.0–36.0)
MCV: 80 fL (ref 78.0–100.0)
Monocytes Absolute: 0.9 10*3/uL (ref 0.1–1.0)
Monocytes Relative: 6 % (ref 3–12)
NEUTROS PCT: 88 % — AB (ref 43–77)
Neutro Abs: 14.4 10*3/uL — ABNORMAL HIGH (ref 1.7–7.7)
Platelets: 297 10*3/uL (ref 150–400)
RBC: 4.5 MIL/uL (ref 4.22–5.81)
RDW: 17.6 % — AB (ref 11.5–15.5)
WBC: 16.5 10*3/uL — AB (ref 4.0–10.5)

## 2013-11-23 LAB — COMPREHENSIVE METABOLIC PANEL
ALBUMIN: 2.9 g/dL — AB (ref 3.5–5.2)
ALK PHOS: 114 U/L (ref 39–117)
ALT: 23 U/L (ref 0–53)
AST: 19 U/L (ref 0–37)
BILIRUBIN TOTAL: 0.5 mg/dL (ref 0.3–1.2)
BUN: 14 mg/dL (ref 6–23)
CO2: 28 mEq/L (ref 19–32)
Calcium: 9.1 mg/dL (ref 8.4–10.5)
Chloride: 99 mEq/L (ref 96–112)
Creatinine, Ser: <0.2 mg/dL — ABNORMAL LOW (ref 0.50–1.35)
Glucose, Bld: 229 mg/dL — ABNORMAL HIGH (ref 70–99)
POTASSIUM: 3.3 meq/L — AB (ref 3.7–5.3)
Sodium: 140 mEq/L (ref 137–147)
Total Protein: 7.8 g/dL (ref 6.0–8.3)

## 2013-11-23 LAB — URINE CULTURE

## 2013-11-23 LAB — GLUCOSE, CAPILLARY
GLUCOSE-CAPILLARY: 212 mg/dL — AB (ref 70–99)
GLUCOSE-CAPILLARY: 257 mg/dL — AB (ref 70–99)
GLUCOSE-CAPILLARY: 264 mg/dL — AB (ref 70–99)
Glucose-Capillary: 245 mg/dL — ABNORMAL HIGH (ref 70–99)
Glucose-Capillary: 237 mg/dL — ABNORMAL HIGH (ref 70–99)
Glucose-Capillary: 223 mg/dL — ABNORMAL HIGH (ref 70–99)
Glucose-Capillary: 286 mg/dL — ABNORMAL HIGH (ref 70–99)

## 2013-11-23 NOTE — Progress Notes (Signed)
INITIAL NUTRITION ASSESSMENT  DOCUMENTATION CODES Per approved criteria  -Not Applicable   INTERVENTION: Continue current TF regimen Continue liquid multivitamin daily  RD to follow for nutrition care plan  NUTRITION DIAGNOSIS: Inadequate oral intake related to chronic dysphagia, ALS as evidenced by TF dependency   Goal: Pt to meet >/= 90% of their estimated nutrition needs   Monitor:  TF regimen & tolerance, weight, labs, I/O's  Reason for Assessment: New Tube Feeding, Low Braden  58 y.o. male  Admitting Dx: sepsis, HCAP, UTI  ASSESSMENT: 58 y.o. year old male with prior hx/o chronic resp failure on home vent 2/2 progressive ALS presenting with sepsis, HCAP, UTI; presented with  increasing blood pressures, fluctuating fevers and increased secretion production over the past week; chest x-ray concerning for bilateral pneumonia.   Patient is currently on ventilator support via trach MV: 10.6 L/min Temp (24hrs), Avg:99.1 F (37.3 C), Min:98.6 F (37 C), Max:99.5 F (37.5 C)   RD spoke with patient's wife and private RN at bedside; home TF regimen is:  Pulmocare formula:  - 1 can with 3 scoops of Beneprotein at 10:30 AM  - 1 can with 3 scoops of Beneprotein at 4:30 PM   - 1 can with 4 scoops of Beneprotein at 8:30 PM  - 4th can is distributed throughout the day with his medications (ie ~ 80 ml TID)  Free water flushes range between 120-240 ml with each feeding.  Total TF regimen provides 1670 kcals, 119 gm protein, 744 ml of free water.    RN and wife report patient's weight has been stable.  Low braden score places patient at risk for skin breakdown.  Height: Ht Readings from Last 1 Encounters:  11/21/13 6' 3.2" (1.91 m)    Weight: Wt Readings from Last 1 Encounters:  11/23/13 179 lb 0.2 oz (81.2 kg)    Ideal Body Weight: 196 lbs  % Ideal Body Weight: 91%  Wt Readings from Last 10 Encounters:  11/23/13 179 lb 0.2 oz (81.2 kg)  12/14/12 179 lb (81.194  kg)  12/14/12 179 lb (81.194 kg)  05/03/12 200 lb (90.719 kg)  05/03/12 200 lb (90.719 kg)  12/09/11 188 lb (85.276 kg)  06/11/11 185 lb (83.915 kg)  04/16/11 195 lb (88.451 kg)    Usual Body Weight: between 168 and 172 lbs  % Usual Body Weight: 104%-106%  BMI:  Body mass index is 22.26 kg/(m^2).  Estimated Nutritional Needs: Kcal: 1700-1900 Protein: 110-120 gm Fluid: 1.7-1.9 L  Skin: Intact  Diet Order: NPO  EDUCATION NEEDS: -No education needs identified at this time   Intake/Output Summary (Last 24 hours) at 11/23/13 1452 Last data filed at 11/23/13 0520  Gross per 24 hour  Intake    725 ml  Output   1926 ml  Net  -1201 ml    Labs:   Recent Labs Lab 11/21/13 1653 11/21/13 2031 11/22/13 0445 11/23/13 0435  NA 134*  --  139 140  K 4.0  --  4.3 3.3*  CL 96  --  100 99  CO2 24  --  25 28  BUN 12  --  11 14  CREATININE <0.20* <0.20* <0.20* <0.20*  CALCIUM 9.6  --  9.0 9.1  GLUCOSE 287*  --  230* 229*    CBG (last 3)   Recent Labs  11/23/13 0423 11/23/13 0728 11/23/13 1157  GLUCAP 245* 237* 223*    Scheduled Meds: . albuterol  2.5 mg Nebulization Q4H  . antiseptic oral  rinse  15 mL Mouth Rinse QID  . chlorhexidine  15 mL Mouth Rinse BID  . clonazePAM  0.5 mg Per Tube BID  . ferrous sulfate  300 mg Per Tube Q breakfast  . heparin  5,000 Units Subcutaneous 3 times per day  . insulin aspart  0-9 Units Subcutaneous 6 times per day  . multivitamin  5 mL Oral Daily  . pantoprazole sodium  40 mg Per Tube BID  . piperacillin-tazobactam (ZOSYN)  IV  3.375 g Intravenous 3 times per day  . polyethylene glycol  17 g Per Tube Daily  . protein supplement  1 scoop Oral TID WC  . PULMOCARE  240 mL Per Tube 3 times per day  . sodium chloride  10-40 mL Intracatheter Q12H  . temazepam  30 mg Per Tube QHS  . venlafaxine  150 mg Oral 2 times per day  . venlafaxine  75 mg Per Tube Q24 Hr x 2  . vitamin B-12  1,000 mcg Per Tube Daily    Continuous  Infusions:   Past Medical History  Diagnosis Date  . ALS (amyotrophic lateral sclerosis)     vent dependent  . Ventilator dependent   . Pneumonia     hx of   . Feeding tube blocked   . History of IBS     miralax daily  . History of constipation   . Urinary incontinence     condom cath  . GERD (gastroesophageal reflux disease)     Lansoprazole-given through feeding tube  . Depression     given via tube Klonopin bid  . Panic attack     ativan prn  . Insomnia     restoril nightly    Past Surgical History  Procedure Laterality Date  . Pic line    . Esophagogastroduodenoscopy    . Feeding tube placed    . Gallbladder tube in place    . Tonsillectomy    . Wisdom teeth extracted     . Colonoscopy    . Bronchoscopy      multipe  . Tracheostomy    . Tracheostomy revision  05/04/2012    Procedure: TRACHEOSTOMY REVISION;  Surgeon: Serena ColonelJefry Rosen, MD;  [facility];  Service: ENT;  Laterality: N/A;  Tracheostomy Change   . Gallbladder tube removal      Maureen ChattersKatie Johnnetta Holstine, RD, LDN Pager #: [phone] After-Hours Pager #: [phone]

## 2013-11-23 NOTE — Progress Notes (Signed)
[facility] PROGRESS NOTE  [name] VWU:[id number] DOB: 10/31/1955 DOA: 11/21/2013 PCP: Stefani DamaELSNER,HENRY J, MD  Assessment/Plan: 1. Aspiration pneumonia versus healthcare associated pneumonia -Patient presented with clinical signs symptoms suggestive of pneumonia, chest x-ray on admission showing alveolar densities at both lung bases appear to be more conspicuous on past studies, could reflect pneumonia. Given history of ALS aspiration pneumonia possibility. -Will continue empiric IV antimicrobial therapy with Zosyn, d/c vanc today -Patient received PICC line placement, anticipate discharge tomorrow with IV antimicrobial therapy for 10 days  2.  Chronic hypoxemic respiratory failure secondary to ALS -Pulmonary critical care consultation -Recommending patient remained on home ventilator with typical settings, tidal volume 600, rate 16, PEEP 5.  3. Sepsis -Present on admission, evidenced by a white count of 17,900, heart rate of 115, respiratory of 27 -Source of infection likely to be lungs from aspiration pneumonia -Continue broad-spectrum IV antimicrobial  4. Nutrition -Patient having residuals in the 400 cc range today. Family felt abdomen could be a little more distended, obtained a KUB which showed no acute changes.  -Could be secondary to decreased motility in setting of infection/sepsis    Code Status: DO NOT RESUSCITATE Family Communication: Spoke with wife present at bedside Disposition Plan: Plan for d/c in am on IV antibiotics   Consultants:  : Critical care medicine  Procedures:  PICC line placement  Antibiotics:  Vancomycin  Zosyn  HPI/Subjective: Patient is a pleasant 58 year old gentleman with a past medical history of ALS, chronic respiratory failure on home vent, admitted to the medicine service on 11/21/2013 presenting with fevers, increased sputum production, suspect secondary to aspiration pneumonia. He was started on broad-spectrum IV  antibiotic therapy with vancomycin and Zosyn.  Objective: Filed Vitals:   11/23/13 1620  BP: 137/72  Pulse: 99  Temp: 100.1 F (37.8 C)  Resp: 20    Intake/Output Summary (Last 24 hours) at 11/23/13 1651 Last data filed at 11/23/13 0520  Gross per 24 hour  Intake    725 ml  Output   1926 ml  Net  -1201 ml   Filed Weights   11/21/13 1942 11/23/13 0400  Weight: 81.2 kg (179 lb 0.2 oz) 81.2 kg (179 lb 0.2 oz)    Exam:   General:  Ill-appearing, no acute distress. He is nonverbal not following commands  Cardiovascular: Tachycardic, regular rhythm normal S1-S2  Respiratory: Coarse respiratory sounds, by lateral crackles, rhonchi, status post trach collar placed  Abdomen: Soft nontender as  Musculoskeletal: Trace edema to extremities  Data Reviewed: Basic Metabolic Panel:  Recent Labs Lab 11/21/13 1653 11/21/13 2031 11/22/13 0445 11/23/13 0435  NA 134*  --  139 140  K 4.0  --  4.3 3.3*  CL 96  --  100 99  CO2 24  --  25 28  GLUCOSE 287*  --  230* 229*  BUN 12  --  11 14  CREATININE <0.20* <0.20* <0.20* <0.20*  CALCIUM 9.6  --  9.0 9.1   Liver Function Tests:  Recent Labs Lab 11/21/13 1653 11/22/13 0445 11/23/13 0435  AST 29 40* 19  ALT 37 32 23  ALKPHOS 130* 125* 114  BILITOT 0.4 0.4 0.5  PROT 8.6* 7.8 7.8  ALBUMIN 3.2* 3.0* 2.9*    Recent Labs Lab 11/21/13 1653  LIPASE 38   No results found for this basename: AMMONIA,  in the last 168 hours CBC:  Recent Labs Lab 11/21/13 1653 11/21/13 2031 11/22/13 0445 11/23/13 0435  WBC 16.2* 13.7* 17.9* 16.5*  NEUTROABS 13.9*  --  15.1* 14.4*  HGB 13.2 12.4* 12.6* 11.6*  HCT 40.5 37.7* 39.2 36.0*  MCV 79.7 79.0 79.2 80.0  PLT 314 352 289 297   Cardiac Enzymes: No results found for this basename: CKTOTAL, CKMB, CKMBINDEX, TROPONINI,  in the last 168 hours BNP (last 3 results) No results found for this basename: PROBNP,  in the last 8760 hours CBG:  Recent Labs Lab 11/23/13 0020  11/23/13 0423 11/23/13 0728 11/23/13 1157 11/23/13 1618  GLUCAP 257* 245* 237* 223* 264*    Recent Results (from the past 240 hour(s))  URINE CULTURE     Status: None   Collection Time    11/21/13  5:18 PM      Result Value Ref Range Status   Specimen Description URINE, RANDOM   Final   Special Requests NONE   Final   Culture  Setup Time     Final   Value: 11/22/2013 04:38     Performed at [facility] Count     Final   Value: >=100,000 COLONIES/ML     Performed at [facility]   Culture     Final   Value: ESCHERICHIA COLI     Performed at [facility]   Report Status PENDING   Incomplete  CULTURE, BLOOD (ROUTINE X 2)     Status: None   Collection Time    11/21/13 11:19 PM      Result Value Ref Range Status   Specimen Description BLOOD RIGHT HAND   Final   Special Requests BOTTLES DRAWN AEROBIC ONLY 5CC   Final   Culture  Setup Time     Final   Value: 11/22/2013 03:54     Performed at [facility]   Culture     Final   Value:        BLOOD CULTURE RECEIVED NO GROWTH TO DATE CULTURE WILL BE HELD FOR 5 DAYS BEFORE ISSUING A FINAL NEGATIVE REPORT     Performed at [facility]   Report Status PENDING   Incomplete  CULTURE, BLOOD (ROUTINE X 2)     Status: None   Collection Time    11/21/13 11:50 PM      Result Value Ref Range Status   Specimen Description BLOOD RIGHT ARM   Final   Special Requests BOTTLES DRAWN AEROBIC AND ANAEROBIC [facility]   Final   Culture  Setup Time     Final   Value: 11/22/2013 03:54     Performed at [facility]   Culture     Final   Value:        BLOOD CULTURE RECEIVED NO GROWTH TO DATE CULTURE WILL BE HELD FOR 5 DAYS BEFORE ISSUING A FINAL NEGATIVE REPORT     Performed at [facility]   Report Status PENDING   Incomplete  MRSA PCR SCREENING     Status: None   Collection Time    11/22/13  2:07 AM      Result Value Ref Range Status   MRSA by PCR NEGATIVE  NEGATIVE Final   Comment:             The GeneXpert MRSA Assay (FDA     approved for NASAL specimens     only), is one component of a     comprehensive MRSA colonization     surveillance program. It is not     intended to diagnose MRSA  infection nor to guide or     monitor treatment for     MRSA infections.     Studies: Dg Abd Portable 1v  11/23/2013   CLINICAL DATA:  Ileus.  EXAM: PORTABLE ABDOMEN - 1 VIEW  COMPARISON:  DG ABD PORTABLE 1V dated 11/21/2013; CT ABD/PELVIS W CM dated 05/17/2011  FINDINGS: Soft tissue structures are unremarkable. Gas pattern is nonspecific. No bowel distention. No free air. No pathologic calcifications. Tubular structure in the right upper quadrant, this could represent a stent. Degenerative changes lumbar spine and both hips.  IMPRESSION: Nonspecific exam.  No bowel distention.   Electronically Signed   By: Maisie Fushomas  Register   On: 11/23/2013 09:18    Scheduled Meds: . albuterol  2.5 mg Nebulization Q4H  . antiseptic oral rinse  15 mL Mouth Rinse QID  . chlorhexidine  15 mL Mouth Rinse BID  . clonazePAM  0.5 mg Per Tube BID  . ferrous sulfate  300 mg Per Tube Q breakfast  . heparin  5,000 Units Subcutaneous 3 times per day  . insulin aspart  0-9 Units Subcutaneous 6 times per day  . multivitamin  5 mL Oral Daily  . pantoprazole sodium  40 mg Per Tube BID  . piperacillin-tazobactam (ZOSYN)  IV  3.375 g Intravenous 3 times per day  . polyethylene glycol  17 g Per Tube Daily  . protein supplement  1 scoop Oral TID WC  . PULMOCARE  240 mL Per Tube 3 times per day  . sodium chloride  10-40 mL Intracatheter Q12H  . temazepam  30 mg Per Tube QHS  . venlafaxine  150 mg Oral 2 times per day  . venlafaxine  75 mg Per Tube Q24 Hr x 2  . vitamin B-12  1,000 mcg Per Tube Daily   Continuous Infusions:   Active Problems:   Sepsis   Ventilator dependence   Tracheostomy status    Time spent: 25 min    Jeralyn BennettEzequiel Kaimen Peine  [facility] Pager [phone]. If 7PM-7AM, please contact  night-coverage at [email or web address], password [email or web address] 11/23/2013, 4:51 PM  LOS: 2 days

## 2013-11-24 LAB — COMPREHENSIVE METABOLIC PANEL
ALT: 18 U/L (ref 0–53)
AST: 15 U/L (ref 0–37)
Albumin: 2.8 g/dL — ABNORMAL LOW (ref 3.5–5.2)
Alkaline Phosphatase: 103 U/L (ref 39–117)
BUN: 21 mg/dL (ref 6–23)
CALCIUM: 8.9 mg/dL (ref 8.4–10.5)
CHLORIDE: 101 meq/L (ref 96–112)
CO2: 28 mEq/L (ref 19–32)
Creatinine, Ser: <0.2 mg/dL — ABNORMAL LOW (ref 0.50–1.35)
GLUCOSE: 222 mg/dL — AB (ref 70–99)
POTASSIUM: 3.1 meq/L — AB (ref 3.7–5.3)
SODIUM: 142 meq/L (ref 137–147)
Total Bilirubin: 0.4 mg/dL (ref 0.3–1.2)
Total Protein: 7.7 g/dL (ref 6.0–8.3)

## 2013-11-24 LAB — GLUCOSE, CAPILLARY
GLUCOSE-CAPILLARY: 264 mg/dL — AB (ref 70–99)
Glucose-Capillary: 223 mg/dL — ABNORMAL HIGH (ref 70–99)
Glucose-Capillary: 207 mg/dL — ABNORMAL HIGH (ref 70–99)

## 2013-11-24 LAB — CBC WITH DIFFERENTIAL/PLATELET
BASOS ABS: 0 10*3/uL (ref 0.0–0.1)
BASOS PCT: 0 % (ref 0–1)
EOS ABS: 0.1 10*3/uL (ref 0.0–0.7)
Eosinophils Relative: 1 % (ref 0–5)
HCT: 33.2 % — ABNORMAL LOW (ref 39.0–52.0)
HEMOGLOBIN: 10.7 g/dL — AB (ref 13.0–17.0)
Lymphocytes Relative: 6 % — ABNORMAL LOW (ref 12–46)
Lymphs Abs: 0.8 10*3/uL (ref 0.7–4.0)
MCH: 25.5 pg — AB (ref 26.0–34.0)
MCHC: 32.2 g/dL (ref 30.0–36.0)
MCV: 79 fL (ref 78.0–100.0)
MONOS PCT: 5 % (ref 3–12)
Monocytes Absolute: 0.8 10*3/uL (ref 0.1–1.0)
NEUTROS ABS: 12.9 10*3/uL — AB (ref 1.7–7.7)
NEUTROS PCT: 88 % — AB (ref 43–77)
Platelets: 259 10*3/uL (ref 150–400)
RBC: 4.2 MIL/uL — ABNORMAL LOW (ref 4.22–5.81)
RDW: 17.4 % — ABNORMAL HIGH (ref 11.5–15.5)
WBC: 14.6 10*3/uL — ABNORMAL HIGH (ref 4.0–10.5)

## 2013-11-24 MED ORDER — ALUM & MAG HYDROXIDE-SIMETH 200-200-20 MG/5ML PO SUSP: 15.0000 mL | Freq: Four times a day (QID) | ORAL | Status: AC | PRN

## 2013-11-24 MED ORDER — POTASSIUM CHLORIDE 20 MEQ/15ML (10%) PO LIQD
40.0000 meq | Freq: Once | ORAL | Status: AC
  Administered 2013-11-24: 40 meq via ORAL
  Filled 2013-11-24: qty 30

## 2013-11-24 MED ORDER — INSULIN ASPART 100 UNIT/ML ~~LOC~~ SOLN: 0.0000 [IU] | SUBCUTANEOUS | Status: DC

## 2013-11-24 MED ORDER — METOCLOPRAMIDE HCL 5 MG/5ML PO SOLN: 5.0000 mg | Freq: Three times a day (TID) | ORAL | Status: DC

## 2013-11-24 MED ORDER — CARBAMIDE PEROXIDE 6.5 % OT SOLN: 2.0000 [drp] | OTIC | Status: AC | PRN

## 2013-11-24 MED ORDER — ALBUTEROL SULFATE (2.5 MG/3ML) 0.083% IN NEBU: 2.5000 mg | INHALATION_SOLUTION | RESPIRATORY_TRACT | Status: DC

## 2013-11-24 MED ORDER — DIPHENHYDRAMINE HCL 12.5 MG/5ML PO ELIX: 25.0000 mg | ORAL_SOLUTION | ORAL | Status: DC | PRN

## 2013-11-24 MED ORDER — POTASSIUM CHLORIDE 20 MEQ/15ML (10%) PO LIQD: 40.0000 meq | Freq: Every day | ORAL | Status: AC

## 2013-11-24 MED ORDER — PIPERACILLIN-TAZOBACTAM 3.375 G IVPB: 3.3750 g | Freq: Three times a day (TID) | INTRAVENOUS | Status: AC

## 2013-11-24 MED ORDER — DIPHENHYDRAMINE HCL 50 MG/ML IJ SOLN
INTRAMUSCULAR | Status: AC
  Administered 2013-11-24: 11:00:00
  Filled 2013-11-24: qty 1

## 2013-11-24 NOTE — Discharge Summary (Signed)
Physician Discharge Summary  [name]:[id number] DOB: 12/23/1955 DOA: 11/21/2013  PCP: Stefani Dama, MD  Admit date: 11/21/2013 Discharge date: 11/24/2013  Time spent: 35 minutes  Recommendations for Outpatient Follow-up:  1. Please follow up on a BMP in 1 week 2. Patient to receive 7 days of IV zosyn 3.375 mg IV TID 3. Discontinue PICC line after completion of IV antibiotic therapy  Discharge Diagnoses:  Active Problems:   Sepsis   Ventilator dependence   Tracheostomy status   Discharge Condition: Stable/Improved  Diet recommendation: Tube feeds  Filed Weights   11/21/13 1942 11/23/13 0400  Weight: 81.2 kg (179 lb 0.2 oz) 81.2 kg (179 lb 0.2 oz)    History of present illness:  History of Present Illness:This is a 58 y.o. 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 Patient's wife states patient has had increasing blood pressures, fluctuating fevers, increased secretion production over the past week. Temperature was up to 2.6 last week. Was given Tylenol for this. Has had progressive worsening sputum production despite aggressive secretion suctioning by around-the-clock home health. Patient has baseline orders less wishes to remain at home. However, home so concerned about fever increased secretions and patient was therefore brought to the ER.  On presentation, patient with a temperature of 100.1, white blood cell count 16.2. KUB negative for bowel traction. Chest x-ray concerning for bilateral pneumonia. No evidence of CHF. UA indicative of infection. PH and PO2 WNL on ABG. Mildly elevated bicarb @ 28.4. Lactate 2.12  Initial plan was for patient to go home with the PICC line at patient's request. However, this was not able to be done after 5:00. I spoke with the technologist from interventional radiology, to plan on placing a PICC line in the morning.   Hospital Course:  Patient is a pleasant  58 year old gentleman with a past medical history of ALS, chronic respiratory failure on home vent, admitted to the medicine service on 11/21/2013 presenting with fevers, increased sputum production, suspect secondary to aspiration pneumonia. He was started on broad-spectrum IV antibiotic therapy with vancomycin and Zosyn, as CXR Increased interstitial and alveolar densities at both lung bases are  not entirely new but are more conspicuous than in the past. These likely reflect pneumonia. With his underlying neurologic disease I suspect this may have been secondary to aspiration PNA. During this hospitalization he was found to have increased residuals on tube feeds. Vancomycin was discontinued as he was maintained of Zosyn. With regards to tube feeds he had residuals as family reported increased abdominal distention. This was further worked up with a KUB that did not show acute abnormalities. Felt this could be related to decreased motility in setting of underlying neurologic disease. Started Reglan 5 mg per tube TID. PICC line was placed during this hospitalization. Patient was discharged home in stable condition with home health services to continue IV antibiotic therapy for 7 days (10 days total).       Procedures:  PICC line placement  Consultations:  PCCM  Discharge Exam: Filed Vitals:   11/24/13 0400  BP: 123/72  Pulse: 97  Temp:   Resp: 19    General: Appears better, awake and alert, nonverbal, trach in place Cardiovascular: RRR nl S1S2 Respiratory: Coarse respiratory sounds, on vent support Abd: Soft nontender nondistended  Discharge Instructions You were cared for by a hospitalist during your hospital stay. If you have any questions about your discharge medications or  the care you received while you were in the hospital after you are discharged, you can call the unit and asked to speak with the hospitalist on call if the hospitalist that took care of you is not available. Once  you are discharged, your primary care physician will handle any further medical issues. Please note that NO REFILLS for any discharge medications will be authorized once you are discharged, as it is imperative that you return to your primary care physician (or establish a relationship with a primary care physician if you do not have one) for your aftercare needs so that they can reassess your need for medications and monitor your lab values.  Discharge Instructions   Call MD for:  difficulty breathing, headache or visual disturbances    Complete by:  As directed      Call MD for:  extreme fatigue    Complete by:  As directed      Call MD for:  persistant nausea and vomiting    Complete by:  As directed      Call MD for:  severe uncontrolled pain    Complete by:  As directed      Call MD for:  temperature >100.4    Complete by:  As directed      Diet - low sodium heart healthy    Complete by:  As directed      Increase activity slowly    Complete by:  As directed             Medication List    STOP taking these medications       ciprofloxacin 750 MG tablet  Commonly known as:  CIPRO      TAKE these medications       acetaminophen 500 MG tablet  Commonly known as:  TYLENOL  Give 1,000 mg by tube every 4 (four) hours as needed. For pain or fever.  Max 6 tablets per 24 hours     albuterol (2.5 MG/3ML) 0.083% nebulizer solution  Commonly known as:  PROVENTIL  Take 2.5 mg by nebulization every 4 (four) hours.     albuterol (2.5 MG/3ML) 0.083% nebulizer solution  Commonly known as:  PROVENTIL  Take 3 mLs (2.5 mg total) by nebulization every 4 (four) hours.     ALOE VERA JUICE Liqd  Give 60 mLs by tube 3 (three) times daily.     alum & mag hydroxide-simeth 200-200-20 MG/5ML suspension  Commonly known as:  MYLANTA  Place 15 mLs into feeding tube every 6 (six) hours as needed for indigestion.     bacitracin ointment  Apply 1 application topically as needed (right arm  scratch).     baclofen 10 MG tablet  Commonly known as:  LIORESAL  Give 10 mg by tube 3 (three) times daily.     BENEFIBER PO  Place 15 mLs into feeding tube daily. In 120 ml of water     carbamide peroxide 6.5 % otic solution  Commonly known as:  DEBROX  Place 2-3 drops into both ears as needed (cerumen).     clonazePAM 0.5 MG tablet  Commonly known as:  KLONOPIN  Place 0.5 mg into feeding tube 2 (two) times daily.     desloratadine 0.5 MG/ML syrup  Commonly known as:  CLARINEX  Take 5 mg by mouth daily. 10ml dose     diphenhydrAMINE 12.5 MG/5ML elixir  Commonly known as:  BENADRYL  Place 10 mLs (25 mg total) into feeding tube every  4 (four) hours as needed for allergies or sleep.     ferrous sulfate 220 (44 FE) MG/5ML solution  Place 220 mg into feeding tube daily with breakfast.     Fish Oil Oil  Place 5 mLs into feeding tube 3 (three) times daily.     FLORA-Q Caps capsule  Place 1 capsule into feeding tube daily as needed. Only while on antibiotics     HONEY PO  Give 5 mLs by tube daily.     HYDROcodone-acetaminophen 5-325 MG per tablet  Commonly known as:  NORCO/VICODIN  Take 1 tablet by mouth every 6 (six) hours as needed for moderate pain.     insulin aspart 100 UNIT/ML injection  Commonly known as:  novoLOG  Inject 0-9 Units into the skin every 4 (four) hours.     ipratropium 0.02 % nebulizer solution  Commonly known as:  ATROVENT  Take 500 mcg by nebulization 4 (four) times daily. at 1000, 1400, 1800, and 2200.     LANSOPRAZOLE PO  Give 30 mg by tube 2 (two) times daily. Conc: 30mg /91ml     LORazepam 1 MG tablet  Commonly known as:  ATIVAN  1 mg by Feeding Tube route every 4 (four) hours as needed for anxiety. For anxiety     metoCLOPramide 5 MG/5ML solution  Commonly known as:  REGLAN  Place 5 mLs (5 mg total) into feeding tube 3 (three) times daily before meals.     MUCINEX PO  Take 1,200 mg by mouth. 3 tabs x 400mg =total dose     MULTIVITAMIN  & MINERAL Liqd  Place 30 mLs into feeding tube daily.     mupirocin ointment 2 %  Commonly known as:  BACTROBAN  Apply 1 application topically daily as needed (for rash).     NONI JUICE PO  Place 30 mLs into feeding tube 2 (two) times daily.     piperacillin-tazobactam 3.375 GM/50ML IVPB  Commonly known as:  ZOSYN  Inject 50 mLs (3.375 g total) into the vein every 8 (eight) hours.     polyethylene glycol packet  Commonly known as:  MIRALAX / GLYCOLAX  Give 17 g by tube daily. May give another dose as needed, For constipation     potassium chloride 20 MEQ/15ML (10%) solution  Place 30 mLs (40 mEq total) into feeding tube daily.     BOOST SMOOTHIE PO  Give 1 Container by tube daily. "yogurt smoothie"     PULMOCARE Liqd  - Place 240 mLs into feeding tube 3 (three) times daily. 1 can of pulmocare ( ) mixed with 3 scoops of beneprotein at 10:30am and 16:30pm.    - 1 can of pulmocare ( ) mixed with 4 scoops of beneprotein at 20:30pm.     SYSTANE 0.4-0.3 % Soln  Generic drug:  Polyethyl Glycol-Propyl Glycol  Apply 1 drop to eye every 2 (two) hours.     temazepam 30 MG capsule  Commonly known as:  RESTORIL  Place 30 mg into feeding tube at bedtime.     venlafaxine 75 MG tablet  Commonly known as:  EFFEXOR  Place 75-150 mg into feeding tube 3 (three) times daily. Take 2 capsule daily at 0600, take 1 capsule daily at 1400, and take 2 capsules daily at 2200.  Mixed in 80 ml of pulmocare.     vitamin B-12 500 MCG tablet  Commonly known as:  CYANOCOBALAMIN  Place 1,000 mcg into feeding tube daily.       Allergies  Allergen  Reactions  . Rilutek [Riluzole] Hives, Shortness Of Breath, Itching and Other (See Comments)  . Amitriptyline Hcl Hives, Itching, Swelling and Rash  . Bentyl [Dicyclomine] Hives, Itching, Swelling and Rash  . Cefdinir Hives, Itching, Swelling and Rash  . Cephalosporins Hives, Itching, Swelling and Rash  . Glutathione Hives, Itching, Swelling and  Rash  . Jevity [Compleat] Other (See Comments)    GI upset symptoms-gas, bloating, upset stomach, etc.  . Latex Hives, Itching, Swelling and Rash  . Levofloxacin Hives, Itching, Swelling and Rash  . Prilosec [Omeprazole] Hives, Itching, Swelling and Rash  . Sulfamethoxazole-Trimethoprim Hives, Itching, Swelling and Rash  . Ciprofloxacin Hives    When given IV cipro. Pt developed hives.       Follow-up Information   Follow up with Stefani Dama, MD In 1 week.   Specialty:  Neurosurgery   Contact information:   [address] [place] [address] [phone]        The results of significant diagnostics from this hospitalization (including imaging, microbiology, ancillary and laboratory) are listed below for reference.    Significant Diagnostic Studies: Dg Chest Portable 1 View  11/21/2013   CLINICAL DATA:  Respiratory distress with congestion and shortness of breath.  EXAM: PORTABLE CHEST - 1 VIEW  COMPARISON:  Portable chest x-ray of May 04, 2012  FINDINGS: The lungs are reasonably well inflated. There are increased interstitial markings in the mid and lower lung zones which are not entirely new. There is partial obscuration of the right heart border. There is an endotracheal tube present whose tip lies approximately 5 cm above the crotch of the carina. The retrocardiac region on the left is quite dense though this is not new. The mediastinum is normal in width. The observed portions of the bony thorax exhibit no acute abnormalities.  IMPRESSION: Increased interstitial and alveolar densities at both lung bases are not entirely new but are more conspicuous than in the past. These likely reflect pneumonia. There may be small amounts of pleural fluid at both lung bases. There is no evidence of CHF.   Electronically Signed   By: David  [place]   On: 11/21/2013 16:40   Dg Abd Portable 1v  11/23/2013   CLINICAL DATA:  Ileus.  EXAM: PORTABLE ABDOMEN - 1 VIEW  COMPARISON:   DG ABD PORTABLE 1V dated 11/21/2013; CT ABD/PELVIS W CM dated 05/17/2011  FINDINGS: Soft tissue structures are unremarkable. Gas pattern is nonspecific. No bowel distention. No free air. No pathologic calcifications. Tubular structure in the right upper quadrant, this could represent a stent. Degenerative changes lumbar spine and both hips.  IMPRESSION: Nonspecific exam.  No bowel distention.   Electronically Signed   By: Maisie Fus  Register   On: 11/23/2013 09:18   Dg Abd Portable 1v  11/21/2013   CLINICAL DATA:  One week history of abdominal distention.  EXAM: PORTABLE ABDOMEN - 1 VIEW  COMPARISON:  None.  FINDINGS: The bowel gas pattern is relatively nonspecific. There is no evidence of obstruction nor of an ileus. There is gas within the transverse portion of the colon. No significant small bowel gas is demonstrated. No free extraluminal gas collections are demonstrated.  IMPRESSION: The bowel gas pattern is nonspecific. There is no evidence of obstruction.   Electronically Signed   By: David  [place]   On: 11/21/2013 17:00    Microbiology: Recent Results (from the past 240 hour(s))  URINE CULTURE     Status: None   Collection Time  11/21/13  5:18 PM      Result Value Ref Range Status   Specimen Description URINE, RANDOM   Final   Special Requests NONE   Final   Culture  Setup Time     Final   Value: 11/22/2013 04:38     Performed at [facility]   Colony Count     Final   Value: >=100,000 COLONIES/ML     Performed at [facility]   Culture     Final   Value: ESCHERICHIA COLI     Performed at [facility]   Report Status 11/23/2013 FINAL   Final   Organism ID, Bacteria ESCHERICHIA COLI   Final  CULTURE, BLOOD (ROUTINE X 2)     Status: None   Collection Time    11/21/13 11:19 PM      Result Value Ref Range Status   Specimen Description BLOOD RIGHT HAND   Final   Special Requests BOTTLES DRAWN AEROBIC ONLY 5CC   Final   Culture  Setup Time     Final   Value:  11/22/2013 03:54     Performed at [facility]   Culture     Final   Value:        BLOOD CULTURE RECEIVED NO GROWTH TO DATE CULTURE WILL BE HELD FOR 5 DAYS BEFORE ISSUING A FINAL NEGATIVE REPORT     Performed at [facility]   Report Status PENDING   Incomplete  CULTURE, BLOOD (ROUTINE X 2)     Status: None   Collection Time    11/21/13 11:50 PM      Result Value Ref Range Status   Specimen Description BLOOD RIGHT ARM   Final   Special Requests BOTTLES DRAWN AEROBIC AND ANAEROBIC [facility]   Final   Culture  Setup Time     Final   Value: 11/22/2013 03:54     Performed at [facility]   Culture     Final   Value:        BLOOD CULTURE RECEIVED NO GROWTH TO DATE CULTURE WILL BE HELD FOR 5 DAYS BEFORE ISSUING A FINAL NEGATIVE REPORT     Performed at [facility]   Report Status PENDING   Incomplete  MRSA PCR SCREENING     Status: None   Collection Time    11/22/13  2:07 AM      Result Value Ref Range Status   MRSA by PCR NEGATIVE  NEGATIVE Final   Comment:            The GeneXpert MRSA Assay (FDA     approved for NASAL specimens     only), is one component of a     comprehensive MRSA colonization     surveillance program. It is not     intended to diagnose MRSA     infection nor to guide or     monitor treatment for     MRSA infections.     Labs: Basic Metabolic Panel:  Recent Labs Lab 11/21/13 1653 11/21/13 2031 11/22/13 0445 11/23/13 0435 11/24/13 0500  NA 134*  --  139 140 142  K 4.0  --  4.3 3.3* 3.1*  CL 96  --  100 99 101  CO2 24  --  25 28 28   GLUCOSE 287*  --  230* 229* 222*  BUN 12  --  11 14 21   CREATININE <0.20* <0.20* <0.20* <0.20* <0.20*  CALCIUM 9.6  --  9.0 9.1 8.9   Liver Function Tests:  Recent Labs Lab 11/21/13 1653 11/22/13 0445 11/23/13 0435 11/24/13 0500  AST 29 40* 19 15  ALT 37 32 23 18  ALKPHOS 130* 125* 114 103  BILITOT 0.4 0.4 0.5 0.4  PROT 8.6* 7.8 7.8 7.7  ALBUMIN 3.2* 3.0* 2.9* 2.8*    Recent  Labs Lab 11/21/13 1653  LIPASE 38   No results found for this basename: AMMONIA,  in the last 168 hours CBC:  Recent Labs Lab 11/21/13 1653 11/21/13 2031 11/22/13 0445 11/23/13 0435 11/24/13 0500  WBC 16.2* 13.7* 17.9* 16.5* 14.6*  NEUTROABS 13.9*  --  15.1* 14.4* 12.9*  HGB 13.2 12.4* 12.6* 11.6* 10.7*  HCT 40.5 37.7* 39.2 36.0* 33.2*  MCV 79.7 79.0 79.2 80.0 79.0  PLT 314 352 289 297 259   Cardiac Enzymes: No results found for this basename: CKTOTAL, CKMB, CKMBINDEX, TROPONINI,  in the last 168 hours BNP: BNP (last 3 results) No results found for this basename: PROBNP,  in the last 8760 hours CBG:  Recent Labs Lab 11/23/13 1618 11/23/13 2011 11/23/13 2337 11/24/13 0338 11/24/13 0819  GLUCAP 264* 286* 212* 223* 207*       Signed:  Jeralyn BennettEzequiel Lauriana Denes  [facility] 11/24/2013, 8:52 AM

## 2013-11-24 NOTE — Progress Notes (Signed)
CSW consulted for transportation. Placed medical necessity form on pt's chart. CSW signing off.   Maryclare Labrador, MSW, [facility] Clinical Social Worker [phone]

## 2013-11-28 ENCOUNTER — Telehealth: Payer: Self-pay | Admitting: Pulmonary Disease

## 2013-11-28 LAB — CULTURE, BLOOD (ROUTINE X 2)
CULTURE: NO GROWTH
Culture: NO GROWTH

## 2013-11-28 NOTE — Telephone Encounter (Signed)
Spoke with Zella Ball (wife of patient) Pt just released on 5/22 from [facility]. Diagnosed with PNA and UTI.  Family was advised to follow up soon with Dr Shelle Iron - appt 11/29/13 at 11:30  Is this too soon?  Wife states that in order to come into office tomorrow, she will have to have him transported by EMS.  Wife states that if they wait and come in 1-2 weeks out, they will be able to get transportation.  Please advise Dr Shelle Iron if appt needs to be kept for tomorrow or if they can reschedule 1-2 weeks out.  Blood sugar levels are elevated and wife wants to know if this could be a possible side effect from the IV abx he is currently on. Currently taking piperacillin-tazobactam (ZOSYN) 3.375 GM/50ML IVPB Inject 50 mLs (3.375 g total) into the vein every 8 (eight) hours.  Please advise Dr Shelle Iron. Thanks.

## 2013-11-28 NOTE — Telephone Encounter (Signed)
Pt's wife is aware of KC's response. Nothing further is needed.

## 2013-11-28 NOTE — Telephone Encounter (Signed)
Would be better 2 weeks out. That abx should not cause blood sugars to be increased.  See if he is on steroids, getting dextrose in a maintenance IV, or it may be that his feedings may have too much concentrated sugar?

## 2013-11-28 NOTE — Telephone Encounter (Signed)
If you need to speak to nurse that is caring for him call the hm #.Stanley A Dalton

## 2013-11-29 ENCOUNTER — Inpatient Hospital Stay: Payer: Medicare Other | Admitting: Pulmonary Disease

## 2013-12-04 ENCOUNTER — Telehealth: Payer: Self-pay

## 2013-12-04 ENCOUNTER — Telehealth: Payer: Self-pay | Admitting: Pulmonary Disease

## 2013-12-04 MED ORDER — METOCLOPRAMIDE HCL 5 MG/5ML PO SOLN: 5.0000 mg | Freq: Three times a day (TID) | ORAL | Status: DC

## 2013-12-04 NOTE — Telephone Encounter (Signed)
Spoke with [name] and she reports pt's blood sugars have been running high and Dr Danielle Dess will be calling Dr Shelle Iron to discuss this wk  Just an FYI  Will forward to [facility] to make him aware

## 2013-12-04 NOTE — Telephone Encounter (Signed)
Pt was in hospital recently with pneumonia. Pt was having some residual and placed on Reglan 5mg /71ml, take 78ml via tube TID before meals. Calling for refill of med. Script sent to pharmacy.

## 2013-12-05 NOTE — Telephone Encounter (Signed)
I will wait to hear from [facility], but do they have access to a nutritionist who helps them with tube feeds, formulation, etc.?  If so, would like for them to contact to see if he is getting too much sugar or too many calories in his tube feeds.

## 2013-12-06 ENCOUNTER — Other Ambulatory Visit: Payer: Self-pay | Admitting: Pulmonary Disease

## 2013-12-06 NOTE — Telephone Encounter (Signed)
I called and spoke with [name]. Made aware of recs. She will contact pt nutritionists today. She reports [facility] nurse [place] coming out today and wants to know if [facility] would okay for them to check A1C on pt until we hear from [facility]. Please advise KC thanks

## 2013-12-06 NOTE — Telephone Encounter (Signed)
Spoke with [name] and notified of recs per [facility]  She verbalized understanding Nothing further needed

## 2013-12-06 NOTE — Telephone Encounter (Signed)
Ok with me.  Would focus on tube feeds first, and see how he does from a blood sugar standpoint.

## 2013-12-13 ENCOUNTER — Other Ambulatory Visit: Payer: Self-pay | Admitting: Gastroenterology

## 2013-12-14 ENCOUNTER — Telehealth: Payer: Self-pay | Admitting: Pulmonary Disease

## 2013-12-14 NOTE — Telephone Encounter (Signed)
I called and spoke w/ pt nurse [name]. Made her aware of the below. Nothing further needed

## 2013-12-14 NOTE — Telephone Encounter (Signed)
Called and spoke with Elnita Maxwell  And she stated that the pt has lots of allergies and she has reviewed the pts medications and allergies.  She stated that the yogurt smoothie that they are putting throught the pts tube is nothing but carbs and this will make his sugars go up.  She has no other recs to replace the glucerna with since the pt seems to have an allergy to some of the ingredients.  She just recs no changes and adjust the insulin.  KC please advise. thanks

## 2013-12-14 NOTE — Telephone Encounter (Signed)
Find out from her what the other options are for high sugars other than glucerna

## 2013-12-14 NOTE — Telephone Encounter (Signed)
Called spoke with [place]. She is a Scientific laboratory technician from [facility]. She received a call from pt family regarding high blood sugars. Pt is allergic to a lot of the formula that makes up glucerna for pt to take. She will contact pt PCP to make aware of this as well and see if they have any feed back for her. Will send to [facility] as an [place]

## 2013-12-14 NOTE — Telephone Encounter (Signed)
Why don't we stop the yogurt smoothies and see if sugars improve.

## 2013-12-18 ENCOUNTER — Other Ambulatory Visit: Payer: Self-pay | Admitting: *Deleted

## 2013-12-21 ENCOUNTER — Telehealth: Payer: Self-pay | Admitting: Pulmonary Disease

## 2013-12-21 DIAGNOSIS — A419 Sepsis, unspecified organism: Secondary | ICD-10-CM

## 2013-12-21 NOTE — Telephone Encounter (Signed)
Spoke with Chip BoerVicki , pts home nurse.  She states that PICC line is still in place and pt [facility]  She wants to know if PICC line can be d/ced since pt has not received anything through it in 2 weeks.  Also, if PICC line is d/ced does Dr Shelle Ironlance want any labs drawn prior to d/cing it.  Please advise Pt has appt with Dr Shelle Ironlance 12/25/13 .

## 2013-12-22 ENCOUNTER — Other Ambulatory Visit: Payer: Self-pay | Admitting: Gastroenterology

## 2013-12-22 NOTE — Telephone Encounter (Signed)
[facility] to [facility].  No labs needed.

## 2013-12-22 NOTE — Telephone Encounter (Signed)
Called pt's home and spoke with Chip BoerVicki - [facility] to be d/c'd with no labs needed.  Chip BoerVicki verbalized her understanding and denied any questions/concerns.  [facility]  Nothing further needed; will sign off.

## 2013-12-25 ENCOUNTER — Ambulatory Visit (INDEPENDENT_AMBULATORY_CARE_PROVIDER_SITE_OTHER): Payer: Medicare Other | Admitting: Pulmonary Disease

## 2013-12-25 ENCOUNTER — Encounter: Payer: Self-pay | Admitting: Pulmonary Disease

## 2013-12-25 VITALS — BP 124/82 | HR 85 | Temp 98.2°F | Wt 169.0 lb

## 2013-12-25 DIAGNOSIS — G1221 Amyotrophic lateral sclerosis: Secondary | ICD-10-CM | POA: Diagnosis not present

## 2013-12-25 DIAGNOSIS — Z930 Tracheostomy status: Secondary | ICD-10-CM | POA: Diagnosis not present

## 2013-12-25 DIAGNOSIS — J189 Pneumonia, unspecified organism: Secondary | ICD-10-CM

## 2013-12-25 DIAGNOSIS — J9612 Chronic respiratory failure with hypercapnia: Secondary | ICD-10-CM

## 2013-12-25 DIAGNOSIS — Z9911 Dependence on respirator [ventilator] status: Secondary | ICD-10-CM

## 2013-12-25 DIAGNOSIS — J9610 Chronic respiratory failure, unspecified whether with hypoxia or hypercapnia: Secondary | ICD-10-CM | POA: Diagnosis not present

## 2013-12-25 MED ORDER — ONETOUCH ULTRA 2 W/DEVICE KIT: PACK | Status: AC

## 2013-12-25 MED ORDER — LORAZEPAM 1 MG PO TABS: 1.0000 mg | ORAL_TABLET | ORAL | Status: AC | PRN

## 2013-12-25 NOTE — Assessment & Plan Note (Signed)
It is unclear whether the patient really had pneumonia or not, or whether his sepsis was secondary to his urinary tract infection. He has been treated with adequate antibiotics, and his secretions have decreased significantly. They are also no longer purulent.

## 2013-12-25 NOTE — Progress Notes (Signed)
   Subjective:    Patient ID: [name], male    DOB: [birth year], 58 y.o.   MRN: [id number]  HPI The patient comes in today for followup of his chronic respiratory failure secondary to ALS. He was recently in the hospital for possible pneumonia, and was found to have a urinary tract infection. He was treated with IV antibiotics, and discharged home with a PICC line in place. He has finished his antibiotics, and his line is due to be removed.  He has had no issues with his breathing, and his airway pressures have been normal. He is having no issues with his oxygen saturations, and his secretions have decreased significantly. Unfortunately, his sputum was not cultured in the hospital. He has had issues with hyperglycemia, and I have asked that nutrition minimize his sugar intake is much as possible. They primarily run 180-200, but occasionally get over 200. He is using sliding scale insulin.   Review of Systems  Constitutional: Negative for fever and unexpected weight change.  HENT: Negative for congestion, dental problem, ear pain, nosebleeds, postnasal drip, rhinorrhea, sinus pressure, sneezing, sore throat and trouble swallowing.   Eyes: Negative for redness and itching.  Respiratory: Negative for cough, chest tightness, shortness of breath and wheezing.   Cardiovascular: Negative for palpitations and leg swelling.  Gastrointestinal: Negative for nausea and vomiting.  Genitourinary: Negative for dysuria.  Musculoskeletal: Negative for joint swelling.  Skin: Negative for rash.  Neurological: Negative for headaches.  Hematological: Does not bruise/bleed easily.  Psychiatric/Behavioral: Negative for dysphoric mood. The patient is not nervous/anxious.        Objective:   Physical Exam Thin male in no acute distress on mechanical ventilation. Nose without purulence or discharge noted Neck with trach in place that is clean and free of secretions. Chest with diffuse coarse rhonchi, a  few basilar crackles. Cardiac exam with minimal cardiac, but regular Abdomen soft nontender good bowel sounds Lower extremities with no significant edema, no cyanosis Alert and oriented, moves all 4 extremities.       Assessment & Plan:

## 2013-12-25 NOTE — Patient Instructions (Signed)
Cut back blood sugar checks to am and late afternoon before meals Change insulin to:  200-250 give 3units, 251-300 give 5 units, 301-350 give 7 units, greater than 350 give 9 units. Will give you a prescription for a glucometer. No change in breathing meds or care.

## 2013-12-25 NOTE — Assessment & Plan Note (Signed)
The patient is doing well from this standpoint on his current vent settings. He has adequate oxygen delivery, and also has adequate tidal volumes with normal peak pressures. I have asked him to continue his ventilator support.

## 2013-12-27 ENCOUNTER — Other Ambulatory Visit: Payer: Self-pay | Admitting: Pulmonary Disease

## 2013-12-28 ENCOUNTER — Other Ambulatory Visit: Payer: Self-pay | Admitting: Gastroenterology

## 2013-12-31 ENCOUNTER — Other Ambulatory Visit: Payer: Self-pay | Admitting: Gastroenterology

## 2014-01-05 ENCOUNTER — Other Ambulatory Visit: Payer: Self-pay | Admitting: Gastroenterology

## 2014-01-11 ENCOUNTER — Other Ambulatory Visit: Payer: Self-pay | Admitting: Gastroenterology

## 2014-01-19 ENCOUNTER — Other Ambulatory Visit: Payer: Self-pay | Admitting: Gastroenterology

## 2014-01-29 ENCOUNTER — Other Ambulatory Visit: Payer: Self-pay | Admitting: Gastroenterology

## 2014-02-07 ENCOUNTER — Other Ambulatory Visit: Payer: Self-pay | Admitting: Gastroenterology

## 2014-02-09 ENCOUNTER — Other Ambulatory Visit: Payer: Self-pay | Admitting: Gastroenterology

## 2014-02-16 ENCOUNTER — Encounter: Payer: Self-pay | Admitting: *Deleted

## 2014-02-25 ENCOUNTER — Other Ambulatory Visit: Payer: Self-pay | Admitting: Gastroenterology

## 2014-02-26 ENCOUNTER — Other Ambulatory Visit: Payer: Self-pay | Admitting: Gastroenterology

## 2014-03-04 ENCOUNTER — Other Ambulatory Visit: Payer: Self-pay | Admitting: Pulmonary Disease

## 2014-03-04 ENCOUNTER — Other Ambulatory Visit: Payer: Self-pay | Admitting: Gastroenterology

## 2014-03-06 ENCOUNTER — Other Ambulatory Visit: Payer: Self-pay | Admitting: Pulmonary Disease

## 2014-03-13 ENCOUNTER — Other Ambulatory Visit: Payer: Self-pay | Admitting: Gastroenterology

## 2014-03-29 ENCOUNTER — Telehealth: Payer: Self-pay | Admitting: Gastroenterology

## 2014-03-29 NOTE — Telephone Encounter (Signed)
Spoke with Doneen Poisson who identifies herself as the nurse for the patient. She is asking if the Reglan can be decreased and possibly stopped. The patient is not having any problems. She denies any noted side effects of Reglan. Care givers are looking to decrease the amount of medication the patient takes. Please advise.

## 2014-03-30 NOTE — Telephone Encounter (Signed)
That would be fine 

## 2014-04-06 NOTE — Telephone Encounter (Signed)
yes

## 2014-04-06 NOTE — Telephone Encounter (Signed)
I need to write an order to fax to [facility]. It will say to 1)give protein powder as per dietician order 2) to decrease Reglan to BID dosing for 1 week, if tolerating to d/c reglan . Is this okay? Fax [phone]

## 2014-04-11 ENCOUNTER — Telehealth: Payer: Self-pay | Admitting: Gastroenterology

## 2014-04-12 ENCOUNTER — Other Ambulatory Visit: Payer: Self-pay

## 2014-04-12 ENCOUNTER — Telehealth: Payer: Self-pay | Admitting: Pulmonary Disease

## 2014-04-12 MED ORDER — BENEPROTEIN PO POWD: ORAL | Status: AC

## 2014-04-12 NOTE — Telephone Encounter (Signed)
Order faxed as requested

## 2014-04-12 NOTE — Telephone Encounter (Signed)
Called spoke w/ Environmental education officerVikki-pt nurse. She reports they faxed this to [phone]. Once done, please fax it to [facility] and then mail the form to them. Will await fax

## 2014-04-12 NOTE — Telephone Encounter (Signed)
Fax received and placed in [facility] look at. Please advise once done and give form back to me thanks

## 2014-04-13 NOTE — Telephone Encounter (Signed)
Done

## 2014-04-13 NOTE — Telephone Encounter (Signed)
Form has been done and faxed to [facility] [employer]. I have also faxed a copy to pt spouse. Nothing further needed

## 2014-04-16 ENCOUNTER — Other Ambulatory Visit: Payer: Self-pay | Admitting: Gastroenterology

## 2014-05-02 ENCOUNTER — Telehealth: Payer: Self-pay | Admitting: Pulmonary Disease

## 2014-05-02 MED ORDER — CIPROFLOXACIN HCL 750 MG PO TABS: ORAL_TABLET | ORAL | Status: DC

## 2014-05-02 NOTE — Telephone Encounter (Signed)
Spoke with [name], concerned with patient's secretions Secretions are very thick and tan, lungs sound very course.  Denies wheezing and fever. Reported elevated temp of 99.0 on Saturday.  BS's are increasing x 5-6 days over 200's (224 highest)  [facility] [place] [address] Allergies  Allergen Reactions  . Rilutek [Riluzole] Hives, Shortness Of Breath, Itching and Other (See Comments)  . Amitriptyline Hcl Hives, Itching, Swelling and Rash  . Bentyl [Dicyclomine] Hives, Itching, Swelling and Rash  . Cefdinir Hives, Itching, Swelling and Rash  . Cephalosporins Hives, Itching, Swelling and Rash  . Glutathione Hives, Itching, Swelling and Rash  . Jevity [Compleat] Other (See Comments)    GI upset symptoms-gas, bloating, upset stomach, etc.  . Latex Hives, Itching, Swelling and Rash  . Levofloxacin Hives, Itching, Swelling and Rash  . Prilosec [Omeprazole] Hives, Itching, Swelling and Rash  . Sulfamethoxazole-Trimethoprim Hives, Itching, Swelling and Rash  . Ciprofloxacin Hives    When given IV cipro. Pt developed hives.   Please advise Dr Shelle Ironlance. Thanks.

## 2014-05-02 NOTE — Telephone Encounter (Signed)
Ok to give him cipro 750mg  bid for 7 days to see if helps.

## 2014-05-02 NOTE — Telephone Encounter (Signed)
Spoke with [name] aware [facility]  via [facility] tube.  Nothing further needed.

## 2014-05-06 ENCOUNTER — Other Ambulatory Visit: Payer: Self-pay | Admitting: Pulmonary Disease

## 2014-05-07 ENCOUNTER — Telehealth: Payer: Self-pay

## 2014-05-07 ENCOUNTER — Telehealth: Payer: Self-pay | Admitting: Gastroenterology

## 2014-05-07 ENCOUNTER — Telehealth: Payer: Self-pay | Admitting: Pulmonary Disease

## 2014-05-07 MED ORDER — CIPROFLOXACIN HCL 750 MG PO TABS: ORAL_TABLET | ORAL | Status: DC

## 2014-05-07 NOTE — Telephone Encounter (Signed)
Called spoke with Chip BoerVicki the nurse and aware of recs. Nothing further needed

## 2014-05-07 NOTE — Telephone Encounter (Signed)
A user error has taken place.

## 2014-05-07 NOTE — Telephone Encounter (Signed)
I put a copy of the signed order on your desk. I cannot find anything in the chart.

## 2014-05-07 NOTE — Telephone Encounter (Signed)
Faxed to a number supplied by care giver-[phone]

## 2014-05-07 NOTE — Telephone Encounter (Signed)
This is really tough situation.  Does she think cipro is helping, but needs longer treatment, or does she feel it is not helping at all.  If she feels mucus color is not clearing, we can augmentin 875mg  bid for 7 days?? Have to keep working on suctioning and vibratory vest.  If she feels he is worsening, may have to bring to er for admit for IV abx?

## 2014-05-07 NOTE — Telephone Encounter (Signed)
Called and spoke to [place]. Chip BoerVicki stated the pt has not improved since starting the cipro 750mg  on 10/28 (see phone note 10/28). Pt is still having a lot of mucus that is tan and yellow- the mucus is thinner since 10/28. Pt is also still needing coverage d/t increase in blood glucose. Chip BoerVicki stated the pt is having crackles in right upper and lower lobes. Chip BoerVicki denies wheezing and fever. No change in vent settings. Pt taking mucinex 600mg  qd.    KC please advise.   Allergies  Allergen Reactions  . Rilutek [Riluzole] Hives, Shortness Of Breath, Itching and Other (See Comments)  . Amitriptyline Hcl Hives, Itching, Swelling and Rash  . Bentyl [Dicyclomine] Hives, Itching, Swelling and Rash  . Cefdinir Hives, Itching, Swelling and Rash  . Cephalosporins Hives, Itching, Swelling and Rash  . Glutathione Hives, Itching, Swelling and Rash  . Jevity [Compleat] Other (See Comments)    GI upset symptoms-gas, bloating, upset stomach, etc.  . Latex Hives, Itching, Swelling and Rash  . Levofloxacin Hives, Itching, Swelling and Rash  . Prilosec [Omeprazole] Hives, Itching, Swelling and Rash  . Sulfamethoxazole-Trimethoprim Hives, Itching, Swelling and Rash  . Ciprofloxacin Hives    When given IV cipro. Pt developed hives.

## 2014-05-07 NOTE — Telephone Encounter (Signed)
Left message for Harwood HeightsMichelle. There was an order faxed on 04/25/14. I faxed it again today in case she did not receive it.

## 2014-05-07 NOTE — Telephone Encounter (Signed)
Caller name: Zella BallRobin Relation to pt: [facility] Call back number:ext [phone]    Reason for call:  FYI - Zella BallRobin called to let us know that Marcelino DusterMichelle will be re-faxing over the document.  It was missing the calorie input for each meal.

## 2014-05-07 NOTE — Telephone Encounter (Signed)
Let's extend the cipro another 7 days.

## 2014-05-07 NOTE — Telephone Encounter (Signed)
Called and spoke to Fall[place]icki, she stated pt is not worsening. Chip BoerVicki stated she would prefer to do an extension of the cipro. Pt is scheduled to finish the current treatment on 11/4. Pt is receiving the vibra vest BID. Chip BoerVicki aware to seek emergency care if pt's condition worsens.   KC please advise.

## 2014-05-08 ENCOUNTER — Telehealth: Payer: Self-pay | Admitting: Pulmonary Disease

## 2014-05-08 NOTE — Telephone Encounter (Signed)
Spoke with [name], home nurse Pt was given a Cipro extension x 7 days on 05/07/14  Last night the family noticed facial flushing, increased BP and hive-like splotches on arms and lower legs/feet Pt given Zyrtec and within 20mins these symptoms all cleared up Pt had a reaction to Cipro a few years back when given intravenously and Benadryl was given to counter act these symptoms -- no reactions ever noted when given via peg tube. [name] states that the patient seems to be responding well to the abx, would like to continue with this and have the okay given to give a Benadryl with every Cipro dose to prevent reactions. Breath sounds are unchanged Secretions are more white than tan today Per [name], [facility] has agreed to come out and do an IV if the Dr wants to change to another abx  Also requests verbal order for BS checks QID instead of BID d/t increased levels of 180-190's  Aware that Dr Shelle Ironlance not in office. Will send to DOD, Dr Kriste BasqueNadel. Please advise thanks.  Allergies  Allergen Reactions  . Rilutek [Riluzole] Hives, Shortness Of Breath, Itching and Other (See Comments)  . Amitriptyline Hcl Hives, Itching, Swelling and Rash  . Bentyl [Dicyclomine] Hives, Itching, Swelling and Rash  . Cefdinir Hives, Itching, Swelling and Rash  . Cephalosporins Hives, Itching, Swelling and Rash  . Glutathione Hives, Itching, Swelling and Rash  . Jevity [Compleat] Other (See Comments)    GI upset symptoms-gas, bloating, upset stomach, etc.  . Latex Hives, Itching, Swelling and Rash  . Levofloxacin Hives, Itching, Swelling and Rash  . Prilosec [Omeprazole] Hives, Itching, Swelling and Rash  . Sulfamethoxazole-Trimethoprim Hives, Itching, Swelling and Rash  . Ciprofloxacin Hives    When given IV cipro. Pt developed hives.

## 2014-05-08 NOTE — Telephone Encounter (Signed)
Spoke with [name] and informed that Dr Kriste BasqueNadel wants a routine sputum sample and can use dx of h/o pneumonia.

## 2014-05-08 NOTE — Telephone Encounter (Signed)
Per SN: this is not a good idea to do. He recs to stop the cipro See if pt can get a specimen from suctioning pt so we can culture this and if breathing is worse then go to ED for admission  I called Chip BoerVicki (pt nurse). She reports pt breathing is not worse and will stop the cipro. She reports they will get a specimen and will fax order for SN to sign for this. This will be faxed to triage. Will send back once signed. Will [facility] to [facility] as an [place]

## 2014-05-11 ENCOUNTER — Telehealth: Payer: Self-pay | Admitting: Pulmonary Disease

## 2014-05-11 NOTE — Telephone Encounter (Signed)
Called and spoke to [place] Flat[place]icky, Engineer, civil (consulting)nurse. Vicky stated the pt is improving and doing much better. Vicky stated the pt's blood sugars are still elevated and would like KC to review and possibly adjust the sliding scale for coverage to be given before the pt's CBG gets above 200 mg/dl and to increase the frequency of accu-checks from BID to QID. Vicky stated the pt's CBG are ranging from around 190-270 mg/dl.   Pt's current sliding scale is:  <200: no coverage 200-250: 3 Units 251-300: 5 Units 301-350: 7 Units >351: 9 Units and call MD.   Jonathon BellowsKC please advise.

## 2014-05-11 NOTE — Telephone Encounter (Signed)
Can change to qid for a little bit, then back to bid Can change to 5,7,9 for coverage instead of the 3, 5, 7 until he is better, then back to the more conservative scale.

## 2014-05-11 NOTE — Telephone Encounter (Signed)
Spoke with [name] Aware of rec's per [facility] Nothing further needed.

## 2014-05-14 ENCOUNTER — Telehealth: Payer: Self-pay | Admitting: Pulmonary Disease

## 2014-05-14 NOTE — Telephone Encounter (Signed)
Results received and placed in [facility].  Chip BoerVicki is aware.  Please advise thanks

## 2014-05-15 NOTE — Telephone Encounter (Signed)
[name] is aware. Nothing further needed

## 2014-05-15 NOTE — Telephone Encounter (Signed)
Let them know that his sputum did grow E.Coli, and is resistant to most oral antibiotics.  It is somewhat sensitive to Bactrim, but he cannot take this because of sulfa allergy.  At this point, would not treat him any further with abx since he is better, but if gets sick again, I do not see any other alternative except IV antibiotics.

## 2014-05-15 NOTE — Telephone Encounter (Signed)
I am very satisfied with blood sugars less than 200.

## 2014-05-15 NOTE — Telephone Encounter (Signed)
I called Gregory BoerVicki made aware of results. She voiced her understanding. Pt changed insulin sliding scale to 5,7,9. They are hovering in mid to high 190's.  She wants to know if [facility] okay with this and knows pt can;t communicate if he is having hypoglycemic symptoms. Please advise KC thanks

## 2014-05-17 ENCOUNTER — Other Ambulatory Visit: Payer: Self-pay | Admitting: Pulmonary Disease

## 2014-05-18 ENCOUNTER — Telehealth: Payer: Self-pay | Admitting: Pulmonary Disease

## 2014-05-18 MED ORDER — GLUCOSE BLOOD VI STRP: ORAL_STRIP | Status: DC

## 2014-05-18 NOTE — Telephone Encounter (Signed)
Spoke with pharmacist, states that the patient has to pay a copay $30 for any qty up to #100 strips. Rx sent for #50, wanting to know if they could go ahead and supply a box of 100.  Approval given. Nothing further needed.

## 2014-05-18 NOTE — Telephone Encounter (Signed)
Ok with me 

## 2014-05-18 NOTE — Telephone Encounter (Signed)
Called and spoke to [place], the nurse. Rx called into preferred pharmacy. Chip BoerVicki verbalized understanding and denied any further questions or concerns at this time.

## 2014-05-18 NOTE — Telephone Encounter (Signed)
Received refill request for pt one touch ultra test strips. Reports pt is now testing 4 times a day and wants RX sent in. Please advise KC thanks

## 2014-05-23 ENCOUNTER — Other Ambulatory Visit: Payer: Self-pay

## 2014-06-05 ENCOUNTER — Other Ambulatory Visit: Payer: Self-pay | Admitting: Gastroenterology

## 2014-06-06 ENCOUNTER — Other Ambulatory Visit: Payer: Self-pay | Admitting: Pulmonary Disease

## 2014-06-08 ENCOUNTER — Telehealth: Payer: Self-pay | Admitting: Pulmonary Disease

## 2014-06-08 MED ORDER — GLIPIZIDE ER 10 MG PO TB24: ORAL_TABLET | ORAL | Status: DC

## 2014-06-08 NOTE — Telephone Encounter (Signed)
Let nurse know that we have been thru this before.  I do not usually treat diabetes, especially if it is becoming more of an issue.  I am willing to send in glucatrol XL 10mg  per tube each am for next 2-3 weeks to see if this helps.  If his blood sugars keep going up, may need to get someone else involved with management of this.

## 2014-06-08 NOTE — Telephone Encounter (Signed)
Called spoke with Chip BoerVicki. Aware of below. RX sent in. Nothing further needed

## 2014-06-08 NOTE — Telephone Encounter (Signed)
Spoke with [name], home health nurse, states that pt's blood sugar has been between 200-231 this week.  Has been using 5 units of insulin qid to help manage this.  Pt has been experiencing excessive sleepiness, sleeping through most of day.  Denies any discomfort to home health nurse.  She is wanting to know if KC would perhaps call in metformin to help this pt keep his sugar regulated.  KC please advise.  Thanks!

## 2014-06-10 ENCOUNTER — Other Ambulatory Visit: Payer: Self-pay | Admitting: Pulmonary Disease

## 2014-06-10 MED ORDER — GLIPIZIDE 10 MG PO TABS: 10.0000 mg | ORAL_TABLET | Freq: Every day | ORAL | Status: DC

## 2014-06-12 ENCOUNTER — Telehealth: Payer: Self-pay | Admitting: Pulmonary Disease

## 2014-06-12 NOTE — Telephone Encounter (Signed)
Continue with current glucatrol dose, and can increase in the future if sugars remain greater than 200 consistently

## 2014-06-12 NOTE — Telephone Encounter (Signed)
Spoke with Fortune BrandsVickie Williams-states she checked patients BS this morning; it was 205 and she got distracted and forgot to give patient his 5 units of Insulin before meal time; she checked patients BS at 3:30pm and BS was 227. Pt had all other Insulin doses as usual. Pt is not having any troubles at this time and Glucotrol seems to be working well for the patient. This is only an FYI for [facility] and no call needed back to [facility] unless any changes are needed.    Will forward to [facility] as FYI and let him mark phone note as noted and sign message.

## 2014-06-12 NOTE — Telephone Encounter (Signed)
Called made vicki the nurse aware of below. Nothing further needed

## 2014-06-26 ENCOUNTER — Telehealth: Payer: Self-pay | Admitting: Pulmonary Disease

## 2014-06-26 DIAGNOSIS — Z9911 Dependence on respirator [ventilator] status: Secondary | ICD-10-CM

## 2014-06-26 DIAGNOSIS — J9612 Chronic respiratory failure with hypercapnia: Secondary | ICD-10-CM

## 2014-06-26 NOTE — Telephone Encounter (Signed)
Spoke with vickie, states that pt's suction machine is giving out and needs a new one.  [facility] company can deliver it to the pt's home.  KC are you ok with this order?  Thanks!!

## 2014-06-26 NOTE — Telephone Encounter (Signed)
Ok with me 

## 2014-06-27 ENCOUNTER — Telehealth: Payer: Self-pay | Admitting: Pulmonary Disease

## 2014-06-27 ENCOUNTER — Other Ambulatory Visit: Payer: Self-pay | Admitting: Pulmonary Disease

## 2014-06-27 MED ORDER — GLIPIZIDE 10 MG PO TABS: 10.0000 mg | ORAL_TABLET | Freq: Two times a day (BID) | ORAL | Status: DC

## 2014-06-27 NOTE — Telephone Encounter (Signed)
Called and spoke to [name]. Informed [name] of new order for suction machine. Order placed. [name] verbalized understanding and denied any further questions or concerns at this time.

## 2014-06-27 NOTE — Telephone Encounter (Signed)
Called and spoke to [place]. Informed Chip BoerVicki of the recs per [facility]. Chip BoerVicki verbalized understanding and denied any further questions or concerns at this time.

## 2014-06-27 NOTE — Telephone Encounter (Signed)
Pt with elevated blood sugars.  Order sent for increasing dose of glipizide >> order sent for glipizide XL.  Pharmacist informed me that pt can not use glipizide XL since it can't be crushed and put through feeding tube.  Order sent to change glipizide to 10 mg bid.

## 2014-06-27 NOTE — Telephone Encounter (Signed)
Received fax from [facility] nurse stating the above. Please advise KC thanks

## 2014-06-27 NOTE — Telephone Encounter (Signed)
[facility] [place] [facility] informed [facility] of the [facility]. Rx changed to correct frequency. [name] aware. Nothing further needed.

## 2014-06-27 NOTE — Telephone Encounter (Signed)
Can add a 10mg  dose in the afternoon to see if this helps.  If not, may need referral to someone to help manage this.

## 2014-07-03 ENCOUNTER — Telehealth: Payer: Self-pay | Admitting: Pulmonary Disease

## 2014-07-03 NOTE — Telephone Encounter (Signed)
Per pt's chart, KC okayed to increase pt's Glipizide to BID however the rx was sent for the XL which cannot be placed into pt's feeding tube.  A call was put in to the on-call provider regarding this issue and VS okayed for plain Glipizide 10mg  BID #60 w/ 1 refills.    Called spoke with pt's nurse [name] who verified the above but stated the corrected Glipizide rx was never received by the pharmacy.  It appears the rx was "printed" rather than sent electronically.  Ap[facility]ized to [facility] and informed her [facility] [address]    [facility] and [facility]ist Esmeralda ArthurKimber, gave rx verbally. Nothing further needed; will sign off.

## 2014-07-11 ENCOUNTER — Telehealth: Payer: Self-pay | Admitting: Pulmonary Disease

## 2014-07-11 NOTE — Telephone Encounter (Signed)
Spoke with home health nurse, wanted to make aware that they are faxing an updated copy of the patient's most recent CBC lab results. Glipizide was changed recently and she stated that Dr Shelle Ironlance wanted to be updated on labs. These are being faxed to our office. [phone] Will send to to [place] to keep an eye out.

## 2014-07-12 NOTE — Telephone Encounter (Signed)
Fax received and placed in KC look at. Please advise thanks 

## 2014-07-13 MED ORDER — GLIPIZIDE 10 MG PO TABS: ORAL_TABLET | ORAL | Status: DC

## 2014-07-13 NOTE — Telephone Encounter (Signed)
Spoke with [name]-nurse for patient. She is aware of change in Rx per [facility]. I have also updated medication list in EPIC. Nothing more needed at this time.

## 2014-07-13 NOTE — Addendum Note (Signed)
Addended by: Reynaldo MiniumWELCHEL, KATIE C on: 07/13/2014 09:03 AM   Modules accepted: Orders

## 2014-07-13 NOTE — Telephone Encounter (Signed)
[name] called back stating patient will be out of med and needs [facility] [place] [facility]. She also requested we send for 10mg  as the dose changes and this way its easier to change for patient. This has been taken care of and nothing more needed at this time.

## 2014-07-13 NOTE — Telephone Encounter (Signed)
Blood sugars running a little high at night.  Increase glipizide to 20mg  in am and 10mg  in the late afternoon.

## 2014-07-16 ENCOUNTER — Telehealth: Payer: Self-pay | Admitting: Pulmonary Disease

## 2014-07-16 DIAGNOSIS — R739 Hyperglycemia, unspecified: Secondary | ICD-10-CM

## 2014-07-16 NOTE — Telephone Encounter (Signed)
Called spoke with Chip BoerVicki who reports that she believes pt may have developed a reaction to his Glipzide over the weekend.  She noted that pt has a history of sulfa sensitivity.  He had been developing symptoms gradually over time like his skin would get red and his BP was gradually increasing but she was unsure if this was reaction to the Glipizide or if pt was trying "to come down with a bug."  However, when she arrived at pt's home Friday night (11pm) she noticed that his torso and hands were red and BP was 170/60, HR 103.  Saturday after his third tablet he "went over the moon" with BP going up to 200/60, HR 125, resp 18; he was "beet red from head to toe", lips turned purple and his hands were swollen and really red but these symptoms did not being until that night.  On yesterday after the first Glipizide dose his BP was 200/40, HR 135, sats 94%, 99.1 temp with redness all over, hands swelling and he was chewing on his tongue (questionable tingling/itching of the tongue).  The Glipizide was stopped after that first dose.  Pt has had 2 episodes of diarrhea but Chip BoerVicki is unsure if this is from the Glipizide or a GI virus. BS have been running about the same at 190s in the am, 236 was the highest in the evenings  Pt is currently taking Glipizide 10mg , 2 tabs in the morning and 1 tab in the evening via feeding tube KC please advise, thank you.

## 2014-07-16 NOTE — Telephone Encounter (Signed)
Obviously needs to stop glipizide with ?reaction.  I think he is going to have to be referred to a diabetic doctor since I do not manage insulin or other high level diabetic medications.  If they are ok with this, can put in a referral to a diabetic doctor they wish to see, or can refer to a [place] endocrinologist.

## 2014-07-16 NOTE — Telephone Encounter (Signed)
Called and spoke to [place]. Informed Chip BoerVicki of the recs per [facility]. Referral order placed. Chip BoerVicki verbalized understanding and denied any further questions or concerns at this time.

## 2014-07-18 ENCOUNTER — Telehealth: Payer: Self-pay | Admitting: Pulmonary Disease

## 2014-07-18 MED ORDER — AMOXICILLIN-POT CLAVULANATE 250-62.5 MG/5ML PO SUSR: 500.0000 mg | Freq: Three times a day (TID) | ORAL | Status: DC

## 2014-07-18 MED ORDER — INSULIN GLARGINE 100 UNIT/ML ~~LOC~~ SOLN: 10.0000 [IU] | SUBCUTANEOUS | Status: DC

## 2014-07-18 NOTE — Telephone Encounter (Signed)
Per KC augmentin is not on pt allergy list and wanted to know if this is a true allergy. Cinda Questalled [name] and she reports she read online since pt is allergic to cephlasporin, augmentin should not be prescribed. I advised her according to epic augmentin was called in back in 2012 tablet form via peg tube. She can not recall and reports this is fine to call in but would need clarification on augemtin liquid form dose as described below. Please advise thanks  Allergies  Allergen Reactions  . Rilutek [Riluzole] Hives, Shortness Of Breath, Itching and Other (See Comments)  . Amitriptyline Hcl Hives, Itching, Swelling and Rash  . Bentyl [Dicyclomine] Hives, Itching, Swelling and Rash  . Cefdinir Hives, Itching, Swelling and Rash  . Cephalosporins Hives, Itching, Swelling and Rash  . Glutathione Hives, Itching, Swelling and Rash  . Jevity [Compleat] Other (See Comments)    GI upset symptoms-gas, bloating, upset stomach, etc.  . Latex Hives, Itching, Swelling and Rash  . Levofloxacin Hives, Itching, Swelling and Rash  . Prilosec [Omeprazole] Hives, Itching, Swelling and Rash  . Sulfamethoxazole-Trimethoprim Hives, Itching, Swelling and Rash  . Ciprofloxacin Hives    When given IV cipro. Pt developed hives.

## 2014-07-18 NOTE — Telephone Encounter (Signed)
Spoke with Gregory LymeVicki-she is aware of Lantus Rx with directions and [facility] and patient has OV with DM doctor on Monday 07-30-14. Nothing more needed at this time.

## 2014-07-18 NOTE — Telephone Encounter (Signed)
KC please advise on sig and Qty to give of Liquid Augmentin . Thanks.

## 2014-07-18 NOTE — Telephone Encounter (Signed)
Spoke with Molson Coors BrewingJamine. States that patient was having a reaction to Glipizide. That has been stopped but now his sugars are running 337-350. Novolog scale only goes up to 350, rx says that if sugar is above 351 to call our office. Pt has an Endocrinologist appointment coming up.  Since Monday he has developed a low grade fever and has yellow mucus coming out of his trach. Fever can be brought down with Tylenol but mucus is still consistent.  KC- please advise. Thanks.

## 2014-07-18 NOTE — Telephone Encounter (Signed)
Attempted to call [name]. No answer, no voicemail. Will try back.

## 2014-07-18 NOTE — Telephone Encounter (Signed)
[name] calling back stating that patient has an allerigic reation to the augmentin  omnisef and other cephalostopin.Caren GriffinsStanley A Dalton

## 2014-07-18 NOTE — Telephone Encounter (Signed)
Ok to call in liquid form of augmentin. Regarding his blood sugars, would start on lantus 10units sq qam.  Let them know this is long acting, and need to make sure he is getting tube feeds with this.  Is just temporary until he can see endocrine and get on scheduled regimen.

## 2014-07-18 NOTE — Telephone Encounter (Signed)
Spoke with pt nurse [name]. States that the patient is currently on 5-7-9 sliding scale.  Anything below 200 is not covered  200-250 is 5 Units 251-300 is 7 Units  301-350 is 9 Units Anything 351 or higher, call MD  Appt has not been scheduled yet for Endocrinology. Order was placed 07/16/14 but does not look to have been handled yet. [facility] the referral has been placed in que for Endocrinology to contact the patient to schedule.   [name] is requesting a liquid form of the Augmentin. The liquid form that is offered is Augmentin 250-62.5mg /105mL. Please advise on dosing KC. Thanks.

## 2014-07-18 NOTE — Telephone Encounter (Signed)
Can prescribe augmentin 875mg  one bid for 7 days for his sputum. His blood sugars may be up more than usual because of possible infection.  Need to know how much insulin he gets with his sliding scale for each dose range.  When is his apptm with endocrinology?

## 2014-07-18 NOTE — Telephone Encounter (Signed)
 500mg  liquid q8 hours for 7 days.

## 2014-07-18 NOTE — Telephone Encounter (Signed)
Since nurse called on Monday pt has developed a low grade fever and has yellow stuff coming from trach.[name]

## 2014-07-19 ENCOUNTER — Telehealth: Payer: Self-pay | Admitting: Pulmonary Disease

## 2014-07-19 MED ORDER — AMOXICILLIN-POT CLAVULANATE 250-62.5 MG/5ML PO SUSR: 500.0000 mg | Freq: Three times a day (TID) | ORAL | Status: DC

## 2014-07-19 NOTE — Telephone Encounter (Signed)
Spoke with [name]-pt nurse. She reports pt does have diabetes but KC has been treating him. Pt is scheduled to see endo on 07/30/14. She wants VO to d/c glipizide since pt had some redness from this medication. It is currently on hold and is fine with call back on Monday [facility]. Please advise thanks

## 2014-07-19 NOTE — Telephone Encounter (Signed)
Below are KC rec's for Augmentin liquid abx. [name] calling stating that when they went to pick up Rx it was instructed to take by mouth and by NG Tube. This was changed and Rx was sent to pharmacy.     Gregory ShareKeith M Clance, MD at 07/18/2014 5:36 PM     Status: Signed       Expand All Collapse All   500mg  liquid q8 hours for 7 days.          [name] is requesting that the patient's Glipizide be D/C'd. Aware that Dr Shelle Ironlance is not back in office until Monday 07/23/14.  Dr Maple HudsonYoung, please advise if you can give this order to D/C the Glipizide in Dr Teddy Spikelance's absence. Thanks.

## 2014-07-19 NOTE — Telephone Encounter (Signed)
I don't see diabetes on the problem list. If this is correct, then ok to stop glipizide. It would be helpful with these notes o identify "Larene BeachVickie"- is she a nurse, or family, and why did she want to stop glipizide?

## 2014-07-23 ENCOUNTER — Telehealth: Payer: Self-pay | Admitting: Pulmonary Disease

## 2014-07-23 NOTE — Telephone Encounter (Signed)
So did they start on lantus twice a day??  If they did, when are they noticing blood sugars greater than 250?

## 2014-07-23 NOTE — Telephone Encounter (Signed)
I had already sent a message to triage to stop glipizide.  I guess she needs "formal order" sent?

## 2014-07-23 NOTE — Telephone Encounter (Signed)
Per KC have pt d/c augmentin.  Called spoke with Chip BoerVicki and made aware of recs.

## 2014-07-23 NOTE — Telephone Encounter (Signed)
Called spoke with [name] the nurse. Reports they did not start lantus BID. Per KC advise them to do this BID. [name] is aware.  They think pt is having reaction to augmentin. HR increased to 115, red from chest and on up, BP 198/70, sats 98. They did give pt benadryl. Pt has been on it x 07/19/14 and has 2 more days left. Please advise thanks

## 2014-07-23 NOTE — Telephone Encounter (Signed)
Spoke with [name], verbal order given to D/C glipizide. Nothing further needed.

## 2014-07-23 NOTE — Telephone Encounter (Signed)
Spoke with [name], pt's nurse, [name] had d/c'ed Glipizide, seeing endo on 1/26.  Blood sugars are still over 300, [name] [facility] to [facility] the sliding scale since theirs only goes from 200-350.  Should this be readjusted or should they just wait until they see the endo doc to redo this, or do you have any recs in the meantime?    FYI: the number put in for Chip BoerVicki is incorrect.  I had to search through to the last ov note to get phone number, please contact [name] with recs at [phone].  Dr. Shelle Ironlance please advise.  Thanks!!

## 2014-07-23 NOTE — Telephone Encounter (Signed)
Not sure he is having a reaction to this or not, but just stop.

## 2014-07-26 ENCOUNTER — Telehealth: Payer: Self-pay | Admitting: Endocrinology

## 2014-07-26 NOTE — Telephone Encounter (Signed)
What lab orders? This pt has not been seen by Dr. Everardo AllEllison before. Thanks!

## 2014-07-26 NOTE — Telephone Encounter (Signed)
Carla need lab orders faxed over to [facility] Fax # [phone] She need his labs before his Monday appointment.

## 2014-07-29 ENCOUNTER — Telehealth: Payer: Self-pay | Admitting: Internal Medicine

## 2014-07-29 NOTE — Telephone Encounter (Signed)
Call from RN - sugar 360 and sometimes 400 (scaled ends 301-350 - 9units, ) Fo   rec 351-400 - 11 units 401-450 - 13 units > 450 - call MD   Dr. Kalman ShanMurali Luian Schumpert, M.D., [facility] Pulmonary and Critical Care Medicine Staff Physician [facility]  Pulmonary and Critical Care Pager: [phone], If no answer or between  15:00h - 7:00h: call [phone]  07/29/2014 4:47 PM

## 2014-07-31 ENCOUNTER — Ambulatory Visit (INDEPENDENT_AMBULATORY_CARE_PROVIDER_SITE_OTHER): Payer: Medicare Other | Admitting: Endocrinology

## 2014-07-31 ENCOUNTER — Encounter: Payer: Self-pay | Admitting: Endocrinology

## 2014-07-31 VITALS — BP 140/92 | HR 125

## 2014-07-31 DIAGNOSIS — E119 Type 2 diabetes mellitus without complications: Secondary | ICD-10-CM | POA: Insufficient documentation

## 2014-07-31 DIAGNOSIS — E1140 Type 2 diabetes mellitus with diabetic neuropathy, unspecified: Secondary | ICD-10-CM | POA: Diagnosis not present

## 2014-07-31 LAB — HEMOGLOBIN A1C: HEMOGLOBIN A1C: 8.7 % — AB (ref 4.6–6.5)

## 2014-07-31 NOTE — Progress Notes (Signed)
Subjective:    Patient ID: [name], male    DOB: [birth year], 59 y.o.   MRN: [id number]  HPI pt states DM was dx'ed in 2013; he has mild if any neuropathy of the lower extremities, but he has severe ALS; he is unaware of any associated chronic complications; he has been on insulin since 2015; pt receives tube feeding only; he has never had pancreatitis, severe hypoglycemia or DKA.  He gets tube-feeding 4 cans per day (not continuous).  Caretakers say TF is never delayed.  He gets PRN  novolog qid  (averages a total of 30 units per day).  cbg's vary from 200-400.  No recent steroids.   Past Medical History  Diagnosis Date  . ALS (amyotrophic lateral sclerosis)     vent dependent  . Ventilator dependent   . Pneumonia     hx of   . Feeding tube blocked   . History of IBS     miralax daily  . History of constipation   . Urinary incontinence     condom cath  . GERD (gastroesophageal reflux disease)     Lansoprazole-given through feeding tube  . Depression     given via tube Klonopin bid  . Panic attack     ativan prn  . Insomnia     restoril nightly  . Anemia     Past Surgical History  Procedure Laterality Date  . Pic line    . Esophagogastroduodenoscopy    . Feeding tube placed    . Gallbladder tube in place    . Tonsillectomy    . Wisdom teeth extracted     . Colonoscopy    . Bronchoscopy      multipe  . Tracheostomy    . Tracheostomy revision  05/04/2012    Procedure: TRACHEOSTOMY REVISION;  Surgeon: Izora Gala, MD;  Location: [facility];  Service: ENT;  Laterality: N/A;  Tracheostomy Change   . Gallbladder tube removal      History   Social History  . Marital Status: Married    Spouse Name: N/A    Number of Children: 2  . Years of Education: N/A   Occupational History  . disabled    Social History Main Topics  . Smoking status: Never Smoker   . Smokeless tobacco: Never Used  . Alcohol Use: No  . Drug Use: No  . Sexual Activity: No   Other Topics  Concern  . Not on file   Social History Narrative    Current Outpatient Prescriptions on File Prior to Visit  Medication Sig Dispense Refill  . acetaminophen (TYLENOL) 500 MG tablet Give 1,000 mg by tube every 4 (four) hours as needed. For pain or fever.  Max 6 tablets per 24 hours    . albuterol (PROVENTIL) (2.5 MG/3ML) 0.083% nebulizer solution Take 2.5 mg by nebulization every 4 (four) hours.    Marland Kitchen albuterol (PROVENTIL) (2.5 MG/3ML) 0.083% nebulizer solution Take 3 mLs (2.5 mg total) by nebulization every 4 (four) hours. 75 mL 12  . albuterol (PROVENTIL) (2.5 MG/3ML) 0.083% nebulizer solution TAKE 3 MLS (2.5 MG TOTAL) BY NEBULIZATION EVERY 4 (FOUR) HOURS. 600 mL 10  . ALOE VERA JUICE LIQD Give 60 mLs by tube 3 (three) times daily.    Marland Kitchen alum & mag hydroxide-simeth (MYLANTA) 093-818-29 MG/5ML suspension Place 15 mLs into feeding tube every 6 (six) hours as needed for indigestion. 355 mL 0  . amoxicillin-clavulanate (AUGMENTIN) 250-62.5 MG/5ML suspension Place 10 mLs (500  mg total) into feeding tube every 8 (eight) hours. 210 mL 0  . bacitracin ointment Apply 1 application topically as needed (right arm scratch).    . baclofen (LIORESAL) 10 MG tablet Give 10 mg by tube 3 (three) times daily.    . Blood Glucose Monitoring Suppl (ONE TOUCH ULTRA 2) W/DEVICE KIT Test blood sugar twice daily 1 each 0  . carbamide peroxide (DEBROX) 6.5 % otic solution Place 2-3 drops into both ears as needed (cerumen). 15 mL 0  . ciprofloxacin (CIPRO) 750 MG tablet Place 1 tablet in peg tube twice daily x 7 days. 14 tablet 0  . clonazePAM (KLONOPIN) 0.5 MG tablet Place 0.5 mg into feeding tube 2 (two) times daily.     . [employer] CHEST CONGESTION RELIEF 400 MG TABS tablet CRUSH 3 TABLETS AND PLACE IN NG TUBE 2 TIMES A DAY 60 tablet 0  . [employer] CHEST CONGESTION RELIEF 400 MG TABS tablet CRUSH 3 TABLETS AND PLACE IN G TUBE 2 TIMES A DAY 60 tablet 2  . desloratadine (CLARINEX) 0.5 MG/ML syrup Take 5 mg by mouth daily. 39ml dose     . diphenhydrAMINE (BENADRYL) 12.5 MG/5ML elixir Place 10 mLs (25 mg total) into feeding tube every 4 (four) hours as needed for allergies or sleep. 120 mL 0  . ferrous sulfate 220 (44 FE) MG/5ML solution Place 220 mg into feeding tube daily with breakfast.    . ferrous sulfate 220 (44 FE) MG/5ML solution GIVE 5MLS PER G-TUBE ONCE DAILY 150 mL 3  . ferrous sulfate 220 (44 FE) MG/5ML solution GIVE 5MLS PER G-TUBE ONCE DAILY 150 mL 3  . Fish Oil OIL Place 5 mLs into feeding tube 3 (three) times daily.     Maple Mirza (FLORA-Q) CAPS Place 1 capsule into feeding tube daily as needed. Only while on antibiotics    . glucose blood (ONE TOUCH ULTRA TEST) test strip TEST BLOOD SUGAR FOUR TIMES DAILY 100 each 6  . GuaiFENesin (MUCINEX PO) Take 1,200 mg by mouth. 3 tabs x $Remo'400mg'xaAiA$ =total dose    . HONEY PO Give 5 mLs by tube daily.     Marland Kitchen HYDROcodone-acetaminophen (NORCO/VICODIN) 5-325 MG per tablet Take 1 tablet by mouth every 6 (six) hours as needed for moderate pain.    Marland Kitchen insulin aspart (NOVOLOG) 100 UNIT/ML injection Inject 0-9 Units into the skin every 4 (four) hours. 10 mL 11  . ipratropium (ATROVENT) 0.02 % nebulizer solution Take 500 mcg by nebulization 4 (four) times daily. at 1000, 1400, 1800, and 2200.    Marland Kitchen ipratropium (ATROVENT) 0.02 % nebulizer solution USE 1 AMPULE 4 TIMES A DAY 1125 mL 6  . lansoprazole (PREVACID) 3 mg/ml SUSP oral suspension TAKE 5ML VIA G-TUBE TWICE DAILY AS DIRECTED. 300 mL 3  . LANSOPRAZOLE PO Give 30 mg by tube 2 (two) times daily. Conc: $RemoveBefo'30mg'tImPRNuuLsH$ /38ml    . LORazepam (ATIVAN) 1 MG tablet Place 1 tablet (1 mg total) into feeding tube every 4 (four) hours as needed for anxiety. For anxiety 60 tablet 5  . metoCLOPramide (REGLAN) 5 MG/5ML solution Place 5 mLs (5 mg total) into feeding tube 3 (three) times daily before meals. 120 mL 0  . metoCLOPramide (REGLAN) 5 MG/5ML solution TAKE 5 MLS BY MOUTH 3 TIMES DAILY BEFORE MEALS 120 mL 0  . metoCLOPramide (REGLAN) 5 MG/5ML solution TAKE 5  MLS BY MOUTH 3 TIMES DAILY BEFORE MEALS 120 mL 0  . metoCLOPramide (REGLAN) 5 MG/5ML solution TAKE 5 MLS BY MOUTH 3  TIMES DAILY BEFORE MEALS 120 mL 0  . metoCLOPramide (REGLAN) 5 MG/5ML solution TAKE 5 MLS BY MOUTH 3 TIMES DAILY BEFORE MEALS 450 mL 2  . Multiple Vitamins-Minerals (MULTIVITAMIN & MINERAL) LIQD Place 30 mLs into feeding tube daily.     . mupirocin ointment (BACTROBAN) 2 % Apply 1 application topically daily as needed (for rash).     . Noni, Morinda citrifolia, (NONI JUICE PO) Place 30 mLs into feeding tube 2 (two) times daily.     . Nutritional Supplements (BOOST SMOOTHIE PO) Give 1 Container by tube daily. "yogurt smoothie"    . Nutritional Supplements (PULMOCARE) LIQD Place 240 mLs into feeding tube 3 (three) times daily. 1 can of pulmocare (228ml) mixed with 3 scoops of beneprotein at 10:30am and 16:30pm.   1 can of pulmocare (250ml) mixed with 4 scoops of beneprotein at 20:30pm.    . Polyethyl Glycol-Propyl Glycol (SYSTANE) 0.4-0.3 % SOLN Apply 1 drop to eye every 2 (two) hours.    . polyethylene glycol (MIRALAX / GLYCOLAX) packet Give 17 g by tube daily. May give another dose as needed, For constipation    . potassium chloride 20 MEQ/15ML (10%) solution Place 30 mLs (40 mEq total) into feeding tube daily. 500 mL 0  . protein supplement (RESOURCE BENEPROTEIN) POWD 60 grams daily bolus per tube and as directed by dietician 1800 g 11  . temazepam (RESTORIL) 30 MG capsule Place 30 mg into feeding tube at bedtime.     Marland Kitchen venlafaxine (EFFEXOR) 75 MG tablet Place 75-150 mg into feeding tube 3 (three) times daily. Take 2 capsule daily at 0600, take 1 capsule daily at 1400, and take 2 capsules daily at 2200.  Mixed in 80 ml of pulmocare.    . vitamin B-12 (CYANOCOBALAMIN) 500 MCG tablet Place 1,000 mcg into feeding tube daily.     . Water For Irrigation, Sterile (STERILE WATER FOR IRRIGATION) USE AS DIRECTED PER G TUBE 6000 mL 0  . Wheat Dextrin (BENEFIBER PO) Place 15 mLs into feeding  tube daily. In 120 ml of water     No current facility-administered medications on file prior to visit.    Allergies  Allergen Reactions  . Rilutek [Riluzole] Hives, Shortness Of Breath, Itching and Other (See Comments)  . Amitriptyline Hcl Hives, Itching, Swelling and Rash  . Bentyl [Dicyclomine] Hives, Itching, Swelling and Rash  . Cefdinir Hives, Itching, Swelling and Rash  . Cephalosporins Hives, Itching, Swelling and Rash  . Glutathione Hives, Itching, Swelling and Rash  . Jevity [Compleat] Other (See Comments)    GI upset symptoms-gas, bloating, upset stomach, etc.  . Latex Hives, Itching, Swelling and Rash  . Levofloxacin Hives, Itching, Swelling and Rash  . Prilosec [Omeprazole] Hives, Itching, Swelling and Rash  . Sulfamethoxazole-Trimethoprim Hives, Itching, Swelling and Rash  . Augmentin [Amoxicillin-Pot Clavulanate]     redness  . Ciprofloxacin Hives    When given IV cipro. Pt developed hives.    Family History  Problem Relation Age of Onset  . Colon cancer      BP 140/92 mmHg  Pulse 125  SpO2 97%   Review of Systems denies weight loss, blurry vision, chest pain, n/v, excessive diaphoresis, rhinorrhea, and easy bruising.  He has intermittent headache.  We can't assess sob, except that he seldom assists the vent.  He has urinary frequency and cold intolerance.   He has chronic severe diffuse muscle weakness.  Depression is well-controlled    Objective:   Physical Exam VS: see  vs page GEN: no distress.  In wheelchair.  HEAD: head: no deformity eyes: no periorbital swelling, no proptosis external nose and ears are normal.  mouth: no lesion seen NECK: tracheostomy is present.  Unable to accurately palpate the thyroid.  CHEST WALL: no deformity LUNGS: clear to auscultation CV: tachycardic rate, but normal rhythm, no murmur ABD: abdomen is soft, nontender.  no hepatosplenomegaly.  not distended.  no hernia.  percutaneous gastrostomy is present.    MUSCULOSKELETAL: muscle strength is diffusely absent.  no obvious joint swelling.   EXTEMITIES: no deformity.  no ulcer on the feet.  feet are of normal color and temp.  no edema PULSES: dorsalis pedis intact bilat.   NEURO:  cn 2-12 grossly intact.  sensation is absent to touch on the feet. SKIN:  Normal texture and temperature.  No rash or suspicious lesion is visible.   NODES:  None palpable at the neck PSYCH: alert, but does not respond to commands.  Does not appear anxious nor depressed.     Lab Results  Component Value Date   HGBA1C 8.7* 07/31/2014      Assessment & Plan:  DM: new to me.  He needs increased rx ALS: severe exacerbation.  In this context, insulin needs to be titrated very carefully, as pt cannot express sxs of hypoglycemia.  Also we discussed poor prognosis and its effect of the benefit of glycemic control.  Tachycardia, possibly neuropathic.    Patient is advised the following: Patient Instructions  check your blood sugar 4 times a day.  also check if you have symptoms of your blood sugar being too high or too low.  please keep a record of the readings and bring it to your next appointment here.  You can write it on any piece of paper.  please call us sooner if your blood sugar goes below 70, or if you have a lot of readings over 200.   blood tests are being requested for you today.  We'll let you know about the results. Based on there esults, we'll continue the lantus for now.   Please take the novolog with tube-feeding.   Please come back for a follow-up appointment in 3-4 months.

## 2014-07-31 NOTE — Patient Instructions (Addendum)
check your blood sugar 4 times a day.  also check if you have symptoms of your blood sugar being too high or too low.  please keep a record of the readings and bring it to your next appointment here.  You can write it on any piece of paper.  please call us sooner if your blood sugar goes below 70, or if you have a lot of readings over 200.   blood tests are being requested for you today.  We'll let you know about the results. Based on there esults, we'll continue the lantus for now.   Please take the novolog with tube-feeding.   Please come back for a follow-up appointment in 3-4 months.

## 2014-08-03 ENCOUNTER — Telehealth: Payer: Self-pay | Admitting: Endocrinology

## 2014-08-03 ENCOUNTER — Telehealth: Payer: Self-pay

## 2014-08-03 NOTE — Telephone Encounter (Signed)
[facility] from [facility] Called stating blood sugar reading will be faxed today and needed our fax number. Fax number given.

## 2014-08-03 NOTE — Telephone Encounter (Signed)
Home health advised of note below.

## 2014-08-03 NOTE — Telephone Encounter (Signed)
please call patient: Thanks for your fax. Please increase lantus to 20 units bid

## 2014-08-08 ENCOUNTER — Telehealth: Payer: Self-pay | Admitting: Pulmonary Disease

## 2014-08-08 NOTE — Telephone Encounter (Signed)
This has been placed in KC green folder. Please return to Ellijah Leffel once done. Thanks

## 2014-08-09 ENCOUNTER — Telehealth: Payer: Self-pay | Admitting: Endocrinology

## 2014-08-09 NOTE — Telephone Encounter (Signed)
Loren RacerVicky Williams from [facility] asked you to call her concerning this patient. Phone# [phone]

## 2014-08-10 ENCOUNTER — Telehealth: Payer: Self-pay | Admitting: Endocrinology

## 2014-08-10 NOTE — Telephone Encounter (Signed)
Nurse with home health advised of note below and voiced understanding.

## 2014-08-10 NOTE — Telephone Encounter (Signed)
Contacted Nurse with home health. Blood sugar readings, pulse rate and BP readings will be faxed to office for MD to review.

## 2014-08-10 NOTE — Telephone Encounter (Signed)
Please call facility increase lantus to 25 units bid

## 2014-08-10 NOTE — Telephone Encounter (Signed)
Unable to reach facility will try again at a later time.

## 2014-08-14 ENCOUNTER — Telehealth: Payer: Self-pay | Admitting: Gastroenterology

## 2014-08-14 NOTE — Telephone Encounter (Signed)
Called nurse back and explained to her that we have not seen any request come over on the patient until I came into work this morning. There was 2 fax request from yesterday first we have seen for a request from pharmacy. Nurse said she thought they may have been trying to ger Dr Everardo AllEllison to refill the med.

## 2014-08-15 ENCOUNTER — Other Ambulatory Visit: Payer: Self-pay

## 2014-08-15 ENCOUNTER — Telehealth: Payer: Self-pay

## 2014-08-15 MED ORDER — INSULIN GLARGINE 100 UNIT/ML ~~LOC~~ SOLN: 25.0000 [IU] | Freq: Two times a day (BID) | SUBCUTANEOUS | Status: DC

## 2014-08-15 MED ORDER — INSULIN LISPRO 100 UNIT/ML ~~LOC~~ SOLN: SUBCUTANEOUS | Status: DC

## 2014-08-15 NOTE — Telephone Encounter (Signed)
Received notice from pt's pharmacy that Novolog will no longer be covered by his insurance. Preferred is Humalog.  Ok to change? Thanks!

## 2014-08-15 NOTE — Telephone Encounter (Signed)
Has this been signed and faxed back? Thanks.

## 2014-08-15 NOTE — Telephone Encounter (Signed)
Rx sent for Humalog. Nurse with [facility] advised as well.

## 2014-08-15 NOTE — Telephone Encounter (Signed)
ok 

## 2014-08-16 ENCOUNTER — Telehealth: Payer: Self-pay | Admitting: Endocrinology

## 2014-08-16 MED ORDER — INSULIN NPH (HUMAN) (ISOPHANE) 100 UNIT/ML ~~LOC~~ SUSP: 16.0000 [IU] | Freq: Three times a day (TID) | SUBCUTANEOUS | Status: DC

## 2014-08-16 NOTE — Telephone Encounter (Signed)
Pt's wife would like Rx called into [facility] on [address]. Pharmacy added to pt's chart.

## 2014-08-16 NOTE — Telephone Encounter (Signed)
See below and please advise, Thanks!  

## 2014-08-16 NOTE — Telephone Encounter (Signed)
[name] patients nurse called about his Lantus she the cost was $130.00 after she met her copay, to She stated that Dr Loanne Drilling knew of another medicine from [place] she could take. Please advise

## 2014-08-16 NOTE — Telephone Encounter (Signed)
Yes, we can.  However, we need to spread it out over 3 injections per day.  [facility].  Which one do you want me to send it to?

## 2014-08-16 NOTE — Telephone Encounter (Signed)
i sent rx 

## 2014-08-16 NOTE — Telephone Encounter (Signed)
[name] with [facility] advised rx has been sent.

## 2014-08-23 ENCOUNTER — Telehealth: Payer: Self-pay | Admitting: Pulmonary Disease

## 2014-08-23 NOTE — Telephone Encounter (Signed)
Called spoke with Spouse. Pt has new [facility] now [facility]. She reports [facility] stating need for around the clock nursing for pt. She reports [employer] is wanting us to call the clinical intake at [phone] to advise pt needs this and [id number]-skilled nursing in home. She reports we need to state pt needs to stay with same home health care company [facility]. Policy # [id number].  I tried calling and was advise they had to speak with a physician not a nurse. Please advise KC thanks

## 2014-08-28 ENCOUNTER — Other Ambulatory Visit: Payer: Self-pay

## 2014-08-28 ENCOUNTER — Telehealth: Payer: Self-pay | Admitting: Endocrinology

## 2014-08-28 MED ORDER — ONETOUCH DELICA LANCETS 33G MISC: Status: DC

## 2014-08-28 MED ORDER — GLUCOSE BLOOD VI STRP
ORAL_STRIP | Status: DC
Start: 2014-08-28 — End: 2015-02-12

## 2014-08-28 NOTE — Telephone Encounter (Signed)
Let them know that a letter is easier.  It is very difficult for me to take time out of the day while I am seeing pts to call insurance companies.  They typically put you on hold for long periods of time, and are not open during the hours I am not seeing patients.  If a letter will suffice that would be better, but if no other way, I will get to it when I can during the day.

## 2014-08-28 NOTE — Telephone Encounter (Signed)
Called spoke with spouse. She reports we can try doing a letter and see. Please advise KC thanks  --please mail letter to spouse.

## 2014-08-28 NOTE — Telephone Encounter (Signed)
KC please advise on the status of this.  Thank you!

## 2014-08-28 NOTE — Telephone Encounter (Signed)
Pt needs rx for one touch ultra soft lancets [facility] # [phone]

## 2014-08-28 NOTE — Telephone Encounter (Signed)
Rx sent to pharmacy   

## 2014-08-29 NOTE — Telephone Encounter (Signed)
Letter has been mailed to pt home address confirmed in epic. Nothing further needed

## 2014-08-29 NOTE — Telephone Encounter (Signed)
Letter written

## 2014-09-06 ENCOUNTER — Telehealth: Payer: Self-pay | Admitting: Pulmonary Disease

## 2014-09-06 NOTE — Telephone Encounter (Signed)
Called spoke with Tresa EndoKelly. She reports insurance will not accept the letter. Was told we needed to call [phone] and request auth for 24 hr skilled nursing and [facility].  I called # provided and was advised they do not require a PA for this and is covered. They could not look into this further since it did not require PA. I called kelly and made her aware. She reports they will submit the claims and see what happens. Nothing further needed

## 2014-09-07 ENCOUNTER — Other Ambulatory Visit: Payer: Self-pay | Admitting: Gastroenterology

## 2014-09-14 ENCOUNTER — Telehealth: Payer: Self-pay | Admitting: Pulmonary Disease

## 2014-09-14 NOTE — Telephone Encounter (Signed)
Paper received and faxed back to [facility].  Will forward to mindy to follow up on PA.

## 2014-09-14 NOTE — Telephone Encounter (Signed)
Called Doneen PoissonVickie Williams back at number provided, this is a fax number.  Duplicate messages on this PA.  See other telephone note dated 09/14/14.

## 2014-09-14 NOTE — Telephone Encounter (Signed)
This is a clance pt, sorry.Gregory GriffinsStanley A [name]

## 2014-09-14 NOTE — Telephone Encounter (Signed)
Received a PA for clarinex liquid  300ml per 30 days Call [phone] PT ID  [id number]  Told to call [facility] for this [facility] at [phone] waitning on form to be faxed to [phone].

## 2014-09-18 NOTE — Telephone Encounter (Signed)
PA was approved from 09/14/14-09/14/15. Nothing further needed

## 2014-09-19 ENCOUNTER — Telehealth: Payer: Self-pay | Admitting: Pulmonary Disease

## 2014-09-19 MED ORDER — CLONIDINE HCL 0.1 MG PO TABS
0.1000 mg | ORAL_TABLET | Freq: Two times a day (BID) | ORAL | Status: DC
Start: 2014-09-19 — End: 2014-10-18

## 2014-09-19 NOTE — Telephone Encounter (Signed)
Called made vicki the nurse aware. RX sent in. Nothing further needed

## 2014-09-19 NOTE — Telephone Encounter (Signed)
Let her know that his high BP may be autonomic dysfunction from his neurologic issue.  Would like to try clonidine instead of using ativan Can try clonidine 0.1mg  per tube bid.  His shortness of breath may be from his elevated BP or something entirely different.  If persists despite controlling bp with clonidine, needs to go to ER>

## 2014-09-19 NOTE — Telephone Encounter (Signed)
[name] calling with update on patient.  FYI--States that patients BP has been high for the last 2-3 weeks - Dr Danielle DessElsner is not wanting to treat this at this time - states that he advised them that he would much rather have it high than him bottom out. [name] has been giving Ativan with high BP's -- BP's running 220/60 Pt has started having facial flushing - they feel this is in result of high BP Pt also having some resp distress, seems to be more frequent now. [name] states that the pt makes a "fish face" - sticks his tongue out through puckered lips when he is having difficulty breathing. Pt seems to be having difficulty breathing 24/7 now and the only time he is not in distress is when he is doing his neb treatments. [name] states that even a couple of minutes after a neb treatment his difficulty breathing returns.   Please advise Dr Shelle Ironlance. Thanks.

## 2014-10-05 ENCOUNTER — Other Ambulatory Visit: Payer: Self-pay | Admitting: Pulmonary Disease

## 2014-10-08 ENCOUNTER — Telehealth: Payer: Self-pay | Admitting: Endocrinology

## 2014-10-08 NOTE — Telephone Encounter (Signed)
please call patient's facility: Please increase NPH to 18 units 3 times a day (just before each meal) Please continue the same as-needed humalog. i'll see you next time.

## 2014-10-08 NOTE — Telephone Encounter (Signed)
Unable to reach pt's caretakers at this time time will try again at a later time.

## 2014-10-09 NOTE — Telephone Encounter (Signed)
Home health Nurse Chip BoerVicki notified of instructions below and voiced understanding.

## 2014-10-10 ENCOUNTER — Other Ambulatory Visit: Payer: Self-pay

## 2014-10-10 MED ORDER — INSULIN NPH (HUMAN) (ISOPHANE) 100 UNIT/ML ~~LOC~~ SUSP: 18.0000 [IU] | Freq: Three times a day (TID) | SUBCUTANEOUS | Status: DC

## 2014-10-17 ENCOUNTER — Telehealth: Payer: Self-pay | Admitting: Gastroenterology

## 2014-10-17 NOTE — Telephone Encounter (Signed)
Ok to refill 

## 2014-10-18 ENCOUNTER — Other Ambulatory Visit: Payer: Self-pay | Admitting: Pulmonary Disease

## 2014-10-23 MED ORDER — HYDROCODONE-ACETAMINOPHEN 5-325 MG PO TABS: 1.0000 | ORAL_TABLET | Freq: Four times a day (QID) | ORAL | Status: DC | PRN

## 2014-10-23 NOTE — Telephone Encounter (Signed)
This was never routed back to me. Patient called today wants to pick up rx today. It has to be signed by Arlyce DiceKaplan will get him to sign this afternoon. Wife will pick up today

## 2014-10-30 ENCOUNTER — Telehealth: Payer: Self-pay | Admitting: Endocrinology

## 2014-10-30 NOTE — Telephone Encounter (Signed)
I contacted Vicky. She stated she receiced a ophthalmologist  call. She stated due to the state of the pt's ALS, going to see a ophthalmologist  is not a option at this point.

## 2014-10-30 NOTE — Telephone Encounter (Signed)
Lynden AngVicky ask you to call her concerning patient [phone]

## 2014-11-04 ENCOUNTER — Other Ambulatory Visit: Payer: Self-pay | Admitting: Endocrinology

## 2014-11-14 ENCOUNTER — Telehealth: Payer: Self-pay | Admitting: Gastroenterology

## 2014-11-14 NOTE — Telephone Encounter (Signed)
The patient would like to try "Enemeez" to stimulate a bowel movement daily. It is over the counter, but the nursing agency has to have an order to administer this. Please advise. Enemeez contains a delivered dose of 283mg  of docusate sodium.

## 2014-11-15 ENCOUNTER — Other Ambulatory Visit: Payer: Self-pay

## 2014-11-15 MED ORDER — DOCUSATE SODIUM 283 MG RE ENEM: 1.0000 | ENEMA | Freq: Every day | RECTAL | Status: AC

## 2014-11-15 NOTE — Telephone Encounter (Signed)
Okay to try

## 2014-11-15 NOTE — Telephone Encounter (Signed)
Rx given to [name].

## 2014-11-21 ENCOUNTER — Other Ambulatory Visit: Payer: Self-pay

## 2014-11-21 ENCOUNTER — Telehealth: Payer: Self-pay | Admitting: Gastroenterology

## 2014-11-21 NOTE — Telephone Encounter (Signed)
Spoke with Chip BoerVicki. Patient developed a rash at the rectum. Spouse of the patient reports he does have a sensitivity to the active ingredients. I have sent a d/c order for Enemeez.

## 2014-11-21 NOTE — Telephone Encounter (Signed)
Ok

## 2014-11-28 ENCOUNTER — Telehealth: Payer: Self-pay | Admitting: Endocrinology

## 2014-11-28 ENCOUNTER — Ambulatory Visit: Payer: Medicare Other | Admitting: Endocrinology

## 2014-11-28 DIAGNOSIS — Z0289 Encounter for other administrative examinations: Secondary | ICD-10-CM

## 2014-11-28 NOTE — Telephone Encounter (Signed)
No follow up necessary.  

## 2014-11-28 NOTE — Telephone Encounter (Signed)
Patient no showed today's appt. Please advise on how to follow up. °A. No follow up necessary. °B. Follow up urgent. Contact patient immediately. °C. Follow up necessary. Contact patient and schedule visit in ___ days. °D. Follow up advised. Contact patient and schedule visit in ____weeks. ° °

## 2014-12-07 ENCOUNTER — Ambulatory Visit: Payer: Medicare Other | Admitting: Pulmonary Disease

## 2014-12-13 ENCOUNTER — Other Ambulatory Visit: Payer: Self-pay | Admitting: Endocrinology

## 2014-12-14 ENCOUNTER — Ambulatory Visit (INDEPENDENT_AMBULATORY_CARE_PROVIDER_SITE_OTHER): Payer: Medicare Other | Admitting: Pulmonary Disease

## 2014-12-14 ENCOUNTER — Telehealth: Payer: Self-pay | Admitting: Endocrinology

## 2014-12-14 ENCOUNTER — Encounter: Payer: Self-pay | Admitting: Pulmonary Disease

## 2014-12-14 VITALS — BP 200/68 | HR 112

## 2014-12-14 DIAGNOSIS — G1221 Amyotrophic lateral sclerosis: Secondary | ICD-10-CM | POA: Diagnosis not present

## 2014-12-14 DIAGNOSIS — J9612 Chronic respiratory failure with hypercapnia: Secondary | ICD-10-CM

## 2014-12-14 DIAGNOSIS — Z930 Tracheostomy status: Secondary | ICD-10-CM | POA: Diagnosis not present

## 2014-12-14 MED ORDER — CLONIDINE HCL 0.2 MG PO TABS: ORAL_TABLET | ORAL | Status: AC

## 2014-12-14 MED ORDER — TEMAZEPAM 15 MG PO CAPS: ORAL_CAPSULE | ORAL | Status: AC

## 2014-12-14 NOTE — Assessment & Plan Note (Signed)
The patient is being maintained on mechanical ventilation, and overall he is stable considering the severity of his illness. He is having some elevated peak pressures on the vent, but his lungs are clear, and he has air hunger if we try and decrease his tidal volume. His respiratory therapist at home is working very hard on secretion clearance, with suctioning and a vibratory vest. He is not having purulent secretions or significant quantity of secretions.

## 2014-12-14 NOTE — Progress Notes (Signed)
   Subjective:    Patient ID: [name], male    DOB: Feb 05, 1956, 59 y.o.   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 He has been on mechanical ventilation long-standing, and has stayed fairly stable owing to the fantastic care of his family. He has not had a recent pulmonary infection or skin issue, and appears to be stable on mechanical ventilation. Most recently he has had some issues with high peak pressures, but he has had some thick secretions that are being suctioned vigorously. They were currently nonpurulent in nature. Attempts to dry these up with anticholinergic have resulted in mucus plugging and pulmonary infection.  He is on frequent suctioning, as well as a vibratory vest. His issues today revolve around systolic hypertension, which is only being partially treated with low-dose clonidine. They are also wanting to get him off of Restoril. They feel that he sleeps well when he misses doses, but has been on this medication for so long it needs to be tapered.  He also has been having long-standing issues with autonomic dysfunction, resulting in feeling hot and cold at times.   Review of Systems  Constitutional: Negative for fever and unexpected weight change.  HENT: Negative for congestion, dental problem, ear pain, nosebleeds, postnasal drip, rhinorrhea, sinus pressure, sneezing, sore throat and trouble swallowing.   Eyes: Negative for redness and itching.  Respiratory: Negative for cough, chest tightness, shortness of breath and wheezing.   Cardiovascular: Negative for palpitations and leg swelling.  Gastrointestinal: Negative for nausea and vomiting.  Genitourinary: Negative for dysuria.  Musculoskeletal: Negative for joint swelling.  Skin: Negative for rash.  Neurological: Negative for headaches.  Hematological: Does not bruise/bleed easily.  Psychiatric/Behavioral: Negative for dysphoric mood. The  patient is not nervous/anxious.        Objective:   Physical Exam Thin male in no acute distress Nose without purulence or discharge noted Neck with tracheostomy in place with clean site Chest totally clear to auscultation with no wheezes or rhonchi Cardiac exam with mildly tachycardic but regular rhythm Lower extremities with compression hose in place, no significant edema Unresponsive because of paralysis, and only partially awake at times. He is unable to move any extremity.       Assessment & Plan:

## 2014-12-14 NOTE — Telephone Encounter (Signed)
Please refill x 1 Ov is due  

## 2014-12-14 NOTE — Assessment & Plan Note (Signed)
The patient has very end-stage disease, and is unable to move any muscle in his body. The family thinks he may be communicating with possible eye movement, but I do not appreciate this.  He is having issues with autonomic dysfunction, and currently his blood pressure is quite elevated. Will try and increase his clonidine dose to see if this helps. The family wishes to continue supportive care, but he is an out of hospital DO NOT RESUSCITATE.

## 2014-12-14 NOTE — Patient Instructions (Signed)
Change restoril to 15mg  each night for 2 weeks, then change to every other night for 2 weeks, then try to stop Increase clonidine to 0.2mg  per tube twice a day.  Monitor BP closely, and can make adjustments if needed. Continue on ventilator with current settings.  I think we will have to accept higher peak pressures and work on secretion clearance as you are doing.  followup with Dr. Delton Coombes in 26mos.

## 2014-12-14 NOTE — Telephone Encounter (Signed)
Patients home nurse called and would like medication sent to his pharmacy  Rx: Humalog and needles  Pharmacy: [facility]    Thank you

## 2014-12-17 ENCOUNTER — Other Ambulatory Visit: Payer: Self-pay | Admitting: Endocrinology

## 2014-12-17 NOTE — Telephone Encounter (Signed)
Rx sent on 12/14/2014.

## 2014-12-26 ENCOUNTER — Ambulatory Visit: Payer: Medicare Other | Admitting: Pulmonary Disease

## 2015-01-04 ENCOUNTER — Other Ambulatory Visit: Payer: Self-pay | Admitting: Gastroenterology

## 2015-01-15 ENCOUNTER — Other Ambulatory Visit: Payer: Self-pay | Admitting: Endocrinology

## 2015-01-16 ENCOUNTER — Other Ambulatory Visit: Payer: Self-pay | Admitting: *Deleted

## 2015-01-16 ENCOUNTER — Other Ambulatory Visit: Payer: Self-pay

## 2015-01-16 MED ORDER — GUAIFENESIN 400 MG PO TABS: ORAL_TABLET | ORAL | Status: DC

## 2015-01-16 MED ORDER — "INSULIN SYRINGE-NEEDLE U-100 31G X 5/16"" 0.3 ML MISC": Status: AC

## 2015-02-12 ENCOUNTER — Other Ambulatory Visit: Payer: Self-pay | Admitting: Endocrinology

## 2015-02-12 ENCOUNTER — Telehealth: Payer: Self-pay

## 2015-02-12 NOTE — Telephone Encounter (Signed)
Received [facility] referral for 6 more visits.

## 2015-03-06 ENCOUNTER — Telehealth: Payer: Self-pay | Admitting: Gastroenterology

## 2015-03-06 NOTE — Telephone Encounter (Signed)
[facility] and refills Lansoprazole suspension

## 2015-03-09 ENCOUNTER — Other Ambulatory Visit: Payer: Self-pay | Admitting: Internal Medicine

## 2015-03-15 ENCOUNTER — Telehealth: Payer: Self-pay | Admitting: Gastroenterology

## 2015-03-15 NOTE — Telephone Encounter (Signed)
Is it okay to try a probiotic? Which would you recommend?

## 2015-03-18 ENCOUNTER — Telehealth: Payer: Self-pay | Admitting: Pulmonary Disease

## 2015-03-18 DIAGNOSIS — J9612 Chronic respiratory failure with hypercapnia: Secondary | ICD-10-CM

## 2015-03-18 NOTE — Telephone Encounter (Signed)
Called and spoke to [place]. Informed her of the recs per PW. [name] verbalized understanding and states once the results come back then she will fax the results to the front office. Nothing further needed at this time.

## 2015-03-18 NOTE — Telephone Encounter (Signed)
Called and spoke to [place]. [name] stated she does actually need the orders faxed and cannot take a verbal order. Orders placed and faxed to the provided number [phone]). [name] verbalized understanding and denied any further questions or concerns at this time.   Storm Frisk, MD at 03/18/2015 10:11 AM     Status: Signed       Expand All Collapse All   Obtain CBC, BMET , sputum culture and CXR

## 2015-03-18 NOTE — Telephone Encounter (Signed)
Obtain CBC, BMET , sputum culture and CXR

## 2015-03-18 NOTE — Telephone Encounter (Signed)
I called spoke with [name]--pt nurse. She reports x the weekend pt has had low grade temp of 99.8, yellow, bloody tinged secretions, pt sounds very congested. They have been giving pt mucinex x 1 week. Suctioning him 2-3 times more often than normal. Vent pressure is staying about 50 and pt is on 3 liters O2 and levels are staying 96-97%. [name] the nurse is requesting to get orders for portable CXR and blood work. Pt last saw [facility] 12/14/14. Has not established with new doc yet but does have a recall reminder in chart for RB in October. Will forward to DOD. Please advise Dr. Delford Field thanks

## 2015-03-19 ENCOUNTER — Other Ambulatory Visit: Payer: Self-pay

## 2015-03-19 ENCOUNTER — Telehealth: Payer: Self-pay | Admitting: Emergency Medicine

## 2015-03-19 ENCOUNTER — Telehealth: Payer: Self-pay | Admitting: Endocrinology

## 2015-03-19 MED ORDER — FLORA-Q PO CAPS: 1.0000 | ORAL_CAPSULE | Freq: Every day | ORAL | Status: AC

## 2015-03-19 NOTE — Telephone Encounter (Signed)
Pt wife called and needs Aundra Millet to call their round a clock nurse adn speak to her about her husband  Please call [phone]

## 2015-03-19 NOTE — Telephone Encounter (Signed)
Icontacted the nurse and advised of note below. Pt will keep his appointment for 03/22/2015.

## 2015-03-19 NOTE — Telephone Encounter (Signed)
[name]

## 2015-03-19 NOTE — Telephone Encounter (Signed)
[facility]

## 2015-03-19 NOTE — Telephone Encounter (Signed)
I contacted the nurse. She stated the pt recently had a chest x-ray done and was diagnosed with pneumonia. She stated the pt's blood sugar has not been under 250 for the past week. Pt is taking humalog per sliding scale and the NPH insulin 18 units 3 times per day. Pt has an appointment on 9/16, but may be too weak to make the appointment. Vikki the nurse wanted to know if adjustments could be made during this time to help bring the blood sugars down. Please advise, Thanks!

## 2015-03-19 NOTE — Telephone Encounter (Signed)
Received call from nurse Doneen Poisson, she has xray results for patient and is calling to get fax number so she can send the results to Dr. Delton Coombes, Gave her the fax number. Nothing further needed.

## 2015-03-19 NOTE — Telephone Encounter (Signed)
Tomorrow at 10:45 am I must be able to see you in order to help you.

## 2015-03-20 ENCOUNTER — Telehealth: Payer: Self-pay | Admitting: Emergency Medicine

## 2015-03-20 MED ORDER — CIPROFLOXACIN 500 MG/5ML (10%) PO SUSR: 750.0000 mg | Freq: Two times a day (BID) | ORAL | Status: AC

## 2015-03-20 MED ORDER — DIPHENHYDRAMINE HCL 12.5 MG/5ML PO ELIX: 50.0000 mg | ORAL_SOLUTION | ORAL | Status: AC

## 2015-03-20 NOTE — Telephone Encounter (Signed)
Spoke with [name] and notified of recs per PW  She verbalized understanding  Rx sent to pharm

## 2015-03-20 NOTE — Telephone Encounter (Signed)
CXR report given to PW for review  Please advise recs thanks

## 2015-03-20 NOTE — Telephone Encounter (Signed)
Would recommend cipro  bid x 7days with benadryl  prior to each dose, based on my chart review that is what has worked in the past

## 2015-03-21 ENCOUNTER — Other Ambulatory Visit: Payer: Self-pay | Admitting: Gastroenterology

## 2015-03-21 ENCOUNTER — Telehealth: Payer: Self-pay | Admitting: Emergency Medicine

## 2015-03-21 NOTE — Telephone Encounter (Signed)
Called and spoke to [place]. [name] wanted to be sure the Cipro is via PEG tube, I confirmed this. [name] also stated the pharmacist advised her to give the Benadryl 30 min prior to Cipro, I confirmed this as well. [name] stated she will have the corrections faxed the office to be signed off on. [name] also questioned if we had the lab results back from [place] (not in PW's look at's nor in Epic). I called [facility] and had the results faxed to the front fax. [name] aware once we receive them to call her and fax them to their office at [phone].  Will forward to Crystal to look out for results as PW was the ordering provider.

## 2015-03-21 NOTE — Telephone Encounter (Signed)
Is this ok Dr Arlyce Dice

## 2015-03-21 NOTE — Telephone Encounter (Signed)
ok 

## 2015-03-22 ENCOUNTER — Ambulatory Visit: Payer: Medicare Other | Admitting: Endocrinology

## 2015-03-22 ENCOUNTER — Telehealth: Payer: Self-pay | Admitting: Endocrinology

## 2015-03-22 MED ORDER — HYDROCODONE-ACETAMINOPHEN 5-325 MG PO TABS: 1.0000 | ORAL_TABLET | Freq: Four times a day (QID) | ORAL | Status: AC | PRN

## 2015-03-22 NOTE — Telephone Encounter (Signed)
please call patient: Ov next available 

## 2015-03-22 NOTE — Telephone Encounter (Signed)
Crystal, have you received these results? Please advise.

## 2015-03-22 NOTE — Telephone Encounter (Signed)
Script on Dr Nita Sells desk to sign.........Marland Kitchen

## 2015-03-23 ENCOUNTER — Telehealth: Payer: Self-pay | Admitting: Emergency Medicine

## 2015-03-23 DIAGNOSIS — I499 Cardiac arrhythmia, unspecified: Secondary | ICD-10-CM

## 2015-03-23 NOTE — Telephone Encounter (Signed)
Called and spoke to [facility] nurse . She reports that patient has developed an irregular heartbeat on physical exam. This is new, no reported hx of rhythm disturbance before. His new meds are cipro and benadryl - I wouldn;t expect these to lead to A fib. Certainly stress of a bronchitis could do so. It is impossible for this to be evaluated further over the phone, will need ECG. His BP is 160/64, HR 77, otherwise stable. I believe this can be evaluated as an outpatient unless he experiences clinical decline.   Will have our office call Monday, have him seen as an acute OV for ECG ASAP next week. In meantime I have recommended that they continue cipro, benadryl pre-meds, clonidine, b-blockade.   Levy Pupa, MD, PhD 03/23/2015, 8:43 AM [facility] [phone] or if no answer [phone]

## 2015-03-25 ENCOUNTER — Telehealth: Payer: Self-pay | Admitting: Endocrinology

## 2015-03-25 NOTE — Telephone Encounter (Signed)
please call patient's facility: If pt cannot come in for appt, i can't advise him, so please direct lab results and phone messages to PCP.

## 2015-03-25 NOTE — Telephone Encounter (Signed)
I contacted the pt's nurse and advised Dr. Ellison would need to see the patient before making any adjustments. Pt scheduled for 03/27/2015. 

## 2015-03-25 NOTE — Telephone Encounter (Signed)
I contacted the pt's nurse and advised Dr. Everardo All would need to see the patient before making any adjustments. Pt scheduled for 03/27/2015.

## 2015-03-25 NOTE — Telephone Encounter (Signed)
I contacted [facility] nurse and advised appointment is due. Nurse stated pt is currently not leaving the because of his recent pneumonia dx. Once pt is feeling better from this an appointment will be made.

## 2015-03-25 NOTE — Telephone Encounter (Signed)
See note below to be advised. 

## 2015-03-25 NOTE — Telephone Encounter (Signed)
Patient's rx is at front desk for pick up and patient's home nurse has been advised.

## 2015-03-25 NOTE — Telephone Encounter (Signed)
Bmet and CBC results received.   PW is out of the office this week. Will put results in RB's look at to address tomorrow when he is in the office.

## 2015-03-25 NOTE — Telephone Encounter (Signed)
Spoke with VickiChip BoerStates that the pt's family does not want him leaving the house. She spoke with the company that they use for portable CXR and they can do EKGs. Pt's family would rather this be done at the home. Spoke with RB via telephone. He is okay with this. [name] is aware of this information and asks that the order be faxed to [phone]. Order will be placed for EKG.

## 2015-03-25 NOTE — Telephone Encounter (Signed)
[name] Patient's nurse ask if you could call her. [phone]

## 2015-03-25 NOTE — Telephone Encounter (Signed)
[facility] noted dated 03/23/15 4:14 PM Relationship To Patient Self Return Phone Number [phone] (Secondary) Chief Complaint Blood Sugar High Initial Comment Leavy Cella, [facility] [phone] pt's blood sugar 425, gave him 20 units of NPH 13 units Humalog sliding scale PreDisposition InappropriateToAsk Nurse Assessment Nurse: Ladona Ridgel, RN, Felicia Date/Time (Eastern Time): 03/23/2015 8:50:33 PM Confirm and document reason for call. If symptomatic, describe symptoms. ---PT had blood sugar of 425 at 8:05 pm and he received 1st dose of increased dose of 20 units NPH and 13 of humalog per sliding scale. The nurse earlier today had called Dr. Katrinka Blazing at 4:45 pm and she gave him 18 units of NPH that was previously ordered. At 4:45 pm MD wrote the new order to increase NPH. He is supposed to get NPH TID at 10 am 4 pm and 8 pm. He just ate dinner. Missed appt Friday bc he has pneumonia - on day 4 of cipro. No fever at the moment - Has the patient traveled out of the country within the last 30 days? ---No Does the patient require triage? ---Yes Related visit to physician within the last 2 weeks? ---No Does the PT have any chronic conditions? (i.e. diabetes, asthma, etc.) ---Yes List chronic conditions. ---DM, on vent ALS trach, panick attacks 2 kinds of anemia, IBS Guidelines Guideline Title Affirmed Question Affirmed Notes Nurse Date/Time (Eastern Time) Diabetes - High Blood Sugar Blood glucose > 400 mg/ dl (22 mmol/l) Gaddy, RN, [facility] 03/23/2015 8:56:29 PM Disp. Time Lamount Cohen Time) Disposition Final User 03/23/2015 9:09:06 PM Called On-Call Provider [place], RN, [facility] 03/23/2015 9:09:29 PM Call Completed Ladona Ridgel, RN, [facility] 03/23/2015 9:09:43 PM Call PCP Now Yes Ladona Ridgel, RN, Felicia PLEASE NOTE: All timestamps contained within this report are represented as [place] Standard Time. CONFIDENTIALTY NOTICE: This fax transmission is intended only for the addressee. It contains  information that is legally privileged, confidential or otherwise protected from use or disclosure. If you are not the intended recipient, you are strictly prohibited from reviewing, disclosing, copying using or disseminating any of this information or taking any action in reliance on or regarding this information. If you have received this fax in error, please notify us immediately by telephone so that we can arrange for its return to [place]. Phone: [phone], Toll-Free: [phone], Fax: [phone] Page: 2 of 2 Call Id: [id number] Caller Understands: Yes Disagree/Comply: Comply Care Advice Given Per Guideline CALL PCP NOW: You need to discuss this with your doctor. I'll page him now. If you haven't heard from the on-call doctor within 30 minutes, call again. After Care Instructions Given Call Event Type User Date / Time Description Comments User: Hampton Abbot, RN Date/Time Lamount Cohen Time): 03/23/2015 8:56:56 PM had pulmocare with protein 15 min ago - thru [employer] button User: Hampton Abbot, RN Date/Time (Eastern Time): 03/23/2015 9:02:17 PM blood sugar is 438 now and RR 18 set on ventilator - lips are not bluish - doesnt act different than usual - but doesnt talk User: Hampton Abbot, RN Date/Time (Eastern Time): 03/23/2015 9:05:18 PM earlier at 6 am he was 370 blood sugar (got 11 units humalog) and 10 am 415 blood sugar and got 13 units humalog and 3:36 pm he was 453 blood sugar he got no coverage and 10 min later he was 435 blood sugar and he got 13 units of humalog and 18 units of NPH. - then see above [facility] Phone DateTime Result/Outcome Message Type Notes Terrilee Files [phone] 03/23/2015 9:09:06 PM Called On Call Provider - Reached Doctor  Paged Terrilee Files 03/23/2015 9:09:21 PM Spoke with On Call - General Message Result conferenced MD to caller

## 2015-03-26 ENCOUNTER — Other Ambulatory Visit: Payer: Self-pay | Admitting: Emergency Medicine

## 2015-03-26 ENCOUNTER — Telehealth: Payer: Self-pay | Admitting: Emergency Medicine

## 2015-03-26 NOTE — Telephone Encounter (Signed)
We reviewed labs and ECG with pt' s nurse [name]  and family

## 2015-03-26 NOTE — Telephone Encounter (Signed)
EKG has not been received. Called spoke w/ vicki the nurse. She will fax this over. Will await fax

## 2015-03-26 NOTE — Telephone Encounter (Signed)
RB looked at pt's EKG. EKG >> Sinus rhythm, some occasional extra beats, NO AFIB We also received the pt's sputum culture and CBC. Sputum culture showed positive for psuedomonas and WBC was elevated which is coming from the infection. Pt was treated with Cipro, complete antibiotic.  [name] is aware of the above information. Nothing further was needed.

## 2015-03-27 ENCOUNTER — Ambulatory Visit (INDEPENDENT_AMBULATORY_CARE_PROVIDER_SITE_OTHER): Payer: Medicare Other | Admitting: Endocrinology

## 2015-03-27 ENCOUNTER — Telehealth: Payer: Self-pay | Admitting: Emergency Medicine

## 2015-03-27 ENCOUNTER — Encounter: Payer: Self-pay | Admitting: Endocrinology

## 2015-03-27 VITALS — BP 147/74 | HR 72 | Temp 98.8°F

## 2015-03-27 DIAGNOSIS — E1140 Type 2 diabetes mellitus with diabetic neuropathy, unspecified: Secondary | ICD-10-CM | POA: Diagnosis not present

## 2015-03-27 LAB — POCT GLYCOSYLATED HEMOGLOBIN (HGB A1C): Hemoglobin A1C: 9.8

## 2015-03-27 MED ORDER — INSULIN NPH (HUMAN) (ISOPHANE) 100 UNIT/ML ~~LOC~~ SUSP: 30.0000 [IU] | Freq: Three times a day (TID) | SUBCUTANEOUS | Status: DC

## 2015-03-27 NOTE — Telephone Encounter (Signed)
No further recs beyond guaifenesin  bid, good chest physiotherapy and secretion clearance. The tracheobronchitis / PNA should have been treated. We will follow the sensitivities on the pseudomonas that was cultured to insure it was adequately covered. These results are still pending.

## 2015-03-27 NOTE — Telephone Encounter (Signed)
Called spoke with [name] the nurse. She reports pt finished the last cipro this AM. He is still having yellow secretions, sounds congested, temp this AM was 99.1. She did give pt tylenol. Requesting further recs. Please advise Dr. Delton Coombes, thanks

## 2015-03-27 NOTE — Telephone Encounter (Signed)
Called and made [name] the nurse aware of below. She verbalized understanding and needed nothing further

## 2015-03-27 NOTE — Patient Instructions (Addendum)
check your blood sugar 4 times a day.  also check if you have symptoms of your blood sugar being too high or too low.  please keep a record of the readings and bring it to your next appointment here.  You can write it on any piece of paper.  please call us sooner if your blood sugar goes below 120, or if you have a lot of readings over 200.   Please increase the NPH to 30 units, 3 times per day.  Please call in 2 days, to report how the blood sugar is doing.  Please take the novolog as needed.   His insulin need may come back down as quickly as it went up, so please call if the blood sugar goes below 120.   Please come back for a follow-up appointment in 6 months.

## 2015-03-27 NOTE — Progress Notes (Signed)
Subjective:    Patient ID: [name], male    DOB: 06-03-1956, 59 y.o.   MRN: [id number]  HPI Pt returns for f/u of diabetes mellitus: DM type: Insulin-requiring type 2 Dx'ed: 2013 Complications: none Therapy: insulin since 2015 DKA: never Severe hypoglycemia: never Pancreatitis: never Other: he has severe ALS; he receives tube feeding only; he gets tube-feeding 4 cans per day (not continuous). Interval history: wife brings a record of his cbg's which i have reviewed today.  It varies from 180-400, but the trend is higher over the past 2 weeks.  I asked wife, who says that is due to pneumonia.  He has had no recent steroids.  He receives 30-40 units total of humalog per day.  He gets 4 cans of TF spread throughout the day.  He never misses TF or insulin.  Pt now takes NPH, 22 units TID.   Past Medical History  Diagnosis Date  . ALS (amyotrophic lateral sclerosis)     vent dependent  . Ventilator dependent   . Pneumonia     hx of   . Feeding tube blocked   . History of IBS     miralax daily  . History of constipation   . Urinary incontinence     condom cath  . GERD (gastroesophageal reflux disease)     Lansoprazole-given through feeding tube  . Depression     given via tube Klonopin bid  . Panic attack     ativan prn  . Insomnia     restoril nightly  . Anemia     Past Surgical History  Procedure Laterality Date  . Pic line    . Esophagogastroduodenoscopy    . Feeding tube placed    . Gallbladder tube in place    . Tonsillectomy    . Wisdom teeth extracted     . Colonoscopy    . Bronchoscopy      multipe  . Tracheostomy    . Tracheostomy revision  05/04/2012    Procedure: TRACHEOSTOMY REVISION;  Surgeon: Serena Colonel, MD;  Location: [facility];  Service: ENT;  Laterality: N/A;  Tracheostomy Change   . Gallbladder tube removal      Social History   Social History  . Marital Status: Married    Spouse Name: N/A  . Number of Children: 2  . Years of  Education: N/A   Occupational History  . disabled    Social History Main Topics  . Smoking status: Never Smoker   . Smokeless tobacco: Never Used  . Alcohol Use: No  . Drug Use: No  . Sexual Activity: No   Other Topics Concern  . Not on file   Social History Narrative    Current Outpatient Prescriptions on File Prior to Visit  Medication Sig Dispense Refill  . acetaminophen (TYLENOL) 500 MG tablet Give 1,000 mg by tube every 4 (four) hours as needed. For pain or fever.  Max 6 tablets per 24 hours    . albuterol (PROVENTIL) (2.5 MG/3ML) 0.083% nebulizer solution TAKE 3 MLS (2.5 MG TOTAL) BY NEBULIZATION EVERY 4 (FOUR) HOURS. 600 mL 10  . ALOE VERA JUICE LIQD Give 60 mLs by tube 3 (three) times daily.    Marland Kitchen alum & mag hydroxide-simeth (MYLANTA) 200-200-20 MG/5ML suspension Place 15 mLs into feeding tube every 6 (six) hours as needed for indigestion. 355 mL 0  . bacitracin ointment Apply 1 application topically as needed (right arm scratch).    . baclofen (LIORESAL)  10 MG tablet Give 10 mg by tube 3 (three) times daily.    . Blood Glucose Monitoring Suppl (ONE TOUCH ULTRA 2) W/DEVICE KIT Test blood sugar twice daily 1 each 0  . carbamide peroxide (DEBROX) 6.5 % otic solution Place 2-3 drops into both ears as needed (cerumen). 15 mL 0  . cetirizine (ZYRTEC) 1 MG/ML syrup Give 5 mg by tube daily.    . ciprofloxacin (CIPRO) 500 MG/5ML (10%) suspension Take 7.5 mLs (750 mg total) by mouth 2 (two) times daily. 105 mL 0  . clonazePAM (KLONOPIN) 0.5 MG tablet Place 0.5 mg into feeding tube 2 (two) times daily.     . cloNIDine (CATAPRES) 0.2 MG tablet Take 1 tablet via tube twice daily 180 tablet 3  . [employer] CHEST CONGESTION RELIEF 400 MG TABS tablet CRUSH 3 TABLETS AND PLACE IN G TUBE 2 TIMES A DAY AS NEEDED 60 tablet 0  . desloratadine (CLARINEX) 0.5 MG/ML syrup Take 5 mg by mouth daily. 6ml dose    . diphenhydrAMINE (BENADRYL) 12.5 MG/5ML elixir Place 20 mLs (50 mg total) into feeding tube as  directed. 1000 mL 0  . docusate sodium (ENEMEEZ MINI) 283 MG enema Place 1 enema (283 mg total) rectally daily. If no BM in 30 minutes repeat x1 30 each 11  . ferrous sulfate 220 (44 FE) MG/5ML solution GIVE 5MLS PER G-TUBE ONCE DAILY 150 mL 3  . Fish Oil OIL Place 5 mLs into feeding tube 3 (three) times daily.     Marland Kitchen FLORA-Q (FLORA-Q) CAPS capsule Take 1 capsule by mouth daily. 30 capsule 11  . Flora-Q (FLORA-Q) CAPS Place 1 capsule into feeding tube daily as needed. Only while on antibiotics    . HONEY PO Give 5 mLs by tube daily.     Marland Kitchen HUMALOG 100 UNIT/ML injection INJECT 0-9 UNITS INTO THE SKIN EVERY 4 HOURS 10 mL 1  . HYDROcodone-acetaminophen (NORCO/VICODIN) 5-325 MG per tablet Take 1 tablet by mouth every 6 (six) hours as needed for moderate pain. 30 tablet 0  . Insulin Syringe-Needle U-100 (BD INSULIN SYRINGE ULTRAFINE) 31G X 5/16" 0.3 ML MISC Use to inject insulin 7 times per day 250 each 3  . ipratropium (ATROVENT) 0.02 % nebulizer solution USE 1 AMPULE 4 TIMES A DAY 1125 mL 6  . lansoprazole (PREVACID) 3 mg/ml SUSP oral suspension TAKE 5ML VIA G-TUBE TWICE DAILY AS DIRECTED. 300 mL 3  . LORazepam (ATIVAN) 1 MG tablet Place 1 tablet (1 mg total) into feeding tube every 4 (four) hours as needed for anxiety. For anxiety 60 tablet 5  . metoCLOPramide (REGLAN) 5 MG/5ML solution TAKE 5 MLS BY MOUTH 3 TIMES DAILY BEFORE MEALS 450 mL 2  . Multiple Vitamins-Minerals (MULTIVITAMIN & MINERAL) LIQD Place 30 mLs into feeding tube daily.     . mupirocin ointment (BACTROBAN) 2 % Apply 1 application topically 3 (three) times daily.     . Noni, [place]inda citrifolia, (NONI JUICE PO) Place 30 mLs into feeding tube 2 (two) times daily.     . ONE TOUCH ULTRA TEST test strip TEST BLOOD SUGAR 4 TIMES A DAY 100 each 6  . ONETOUCH DELICA LANCETS 89V MISC USE TO CHECK BLOOD SUGAR 4 TIMES PER DAY 100 each 2  . Polyethyl Glycol-Propyl Glycol (SYSTANE) 0.4-0.3 % SOLN Apply 1 drop to eye every 2 (two) hours.    .  polyethylene glycol (MIRALAX / GLYCOLAX) packet Give 17 g by tube daily. May give another dose as needed, For  constipation    . polyethylene glycol powder (GLYCOLAX/MIRALAX) powder TAKE 17 GM. IN 8 OZ. OF JUICE OR WATER. MAY USE AS MANY TIMES AS NEEDED FOR CONSTIPATION. 3689 g 1  . potassium chloride 20 MEQ/15ML (10%) solution Place 30 mLs (40 mEq total) into feeding tube daily. 500 mL 0  . protein supplement (RESOURCE BENEPROTEIN) POWD 60 grams daily bolus per tube and as directed by dietician 1800 g 11  . temazepam (RESTORIL) 15 MG capsule Take 1 tablet each night x 2 weeks then 1 every other night x 2 weeks then stop. Take via tube 30 capsule 0  . venlafaxine (EFFEXOR) 75 MG tablet 75 mg via tube 3 times per day    . vitamin B-12 (CYANOCOBALAMIN) 500 MCG tablet Place 1,000 mcg into feeding tube daily.     . Water For Irrigation, Sterile (STERILE WATER FOR IRRIGATION) USE AS DIRECTED PER G TUBE 6000 mL 0  . Wheat Dextrin (BENEFIBER PO) Place 15 mLs into feeding tube daily. In 120 ml of water     No current facility-administered medications on file prior to visit.    Allergies  Allergen Reactions  . Rilutek [Riluzole] Hives, Shortness Of Breath, Itching and Other (See Comments)  . Amitriptyline Hcl Hives, Itching, Swelling and Rash  . Bentyl [Dicyclomine] Hives, Itching, Swelling and Rash  . Cefdinir Hives, Itching, Swelling and Rash  . Cephalosporins Hives, Itching, Swelling and Rash  . Glutathione Hives, Itching, Swelling and Rash  . Jevity [Compleat] Other (See Comments)    GI upset symptoms-gas, bloating, upset stomach, etc.  . Latex Hives, Itching, Swelling and Rash  . Levofloxacin Hives, Itching, Swelling and Rash  . Prilosec [Omeprazole] Hives, Itching, Swelling and Rash  . Sulfamethoxazole-Trimethoprim Hives, Itching, Swelling and Rash  . Augmentin [Amoxicillin-Pot Clavulanate]     redness  . Ciprofloxacin Hives    When given IV cipro. Pt developed hives.    Family History    Problem Relation Age of Onset  . Colon cancer      BP 147/74 mmHg  Pulse 72  Temp(Src) 98.8 F (37.1 C) (Oral)  SpO2 96%   Review of Systems i asked wife, who says he has no weight change.  she denies pt has hypoglycemia.      Objective:   Physical Exam VITAL SIGNS:  See vs page GENERAL: no distress.  In power chair.   Pulses: dorsalis pedis intact bilat.   MSK: no deformity of the feet.   CV: no leg edema.   Skin:  no ulcer on the feet.  normal color and temp on the feet.  Neuro: sensation to touch on the feet cannot be determined.  Pt does not respond to commands.     A1c=9.8%    Assessment & Plan:  DM: glycemic control is worse, possibly due to pneumonia ALS: In this context, insulin needs to be titrated very carefully, as pt cannot express sxs of hypoglycemia.    Patient is advised the following: Patient Instructions  check your blood sugar 4 times a day.  also check if you have symptoms of your blood sugar being too high or too low.  please keep a record of the readings and bring it to your next appointment here.  You can write it on any piece of paper.  please call us sooner if your blood sugar goes below 120, or if you have a lot of readings over 200.   Please increase the NPH to 30 units, 3 times per day.  Please call in 2 days, to report how the blood sugar is doing.  Please take the novolog as needed.   His insulin need may come back down as quickly as it went up, so please call if the blood sugar goes below 120.   Please come back for a follow-up appointment in 6 months.

## 2015-03-28 ENCOUNTER — Other Ambulatory Visit: Payer: Self-pay | Admitting: *Deleted

## 2015-03-28 MED ORDER — STERILE WATER FOR IRRIGATION IR SOLN: Status: AC

## 2015-03-29 ENCOUNTER — Telehealth: Payer: Self-pay | Admitting: Endocrinology

## 2015-03-29 ENCOUNTER — Other Ambulatory Visit: Payer: Self-pay

## 2015-03-29 MED ORDER — "INSULIN SYRINGE-NEEDLE U-100 30G X 1/2"" 0.5 ML MISC": Status: AC

## 2015-03-29 NOTE — Telephone Encounter (Signed)
Cecil Cobbs the home health nurse took this information from me over the phone.She will fax over the cbg and she will continue to check the pt blood sugar 4 times a day

## 2015-03-29 NOTE — Telephone Encounter (Signed)
please call wife: Please increase the NPH to 40 units, 3 times per day.  Please fax cbg record on monday

## 2015-03-29 NOTE — Telephone Encounter (Signed)
[name] called stated that pharmacy haven't received anything yet

## 2015-04-01 ENCOUNTER — Telehealth: Payer: Self-pay | Admitting: Emergency Medicine

## 2015-04-01 ENCOUNTER — Telehealth: Payer: Self-pay | Admitting: Endocrinology

## 2015-04-01 MED ORDER — INSULIN NPH (HUMAN) (ISOPHANE) 100 UNIT/ML ~~LOC~~ SUSP: 45.0000 [IU] | Freq: Three times a day (TID) | SUBCUTANEOUS | Status: DC

## 2015-04-01 NOTE — Telephone Encounter (Signed)
I contacted the pt's home health nurse and advised of note below. Vikki the nurse voiced understanding.

## 2015-04-01 NOTE — Telephone Encounter (Signed)
Called and spoke to [place]. [name] stated she faxed over progress notes on pt's status. [name] stated she faxed it to the front fax and it is about 5-6 pages.   Will forward to [place] to look out for.

## 2015-04-01 NOTE — Telephone Encounter (Signed)
please call wife: Please increase the NPH to 45 units, 3 times per day.  Please fax cbg record on Friday.

## 2015-04-02 ENCOUNTER — Other Ambulatory Visit: Payer: Self-pay

## 2015-04-02 ENCOUNTER — Telehealth: Payer: Self-pay | Admitting: Gastroenterology

## 2015-04-02 MED ORDER — INSULIN NPH (HUMAN) (ISOPHANE) 100 UNIT/ML ~~LOC~~ SUSP: 45.0000 [IU] | Freq: Three times a day (TID) | SUBCUTANEOUS | Status: AC

## 2015-04-02 NOTE — Telephone Encounter (Signed)
Fax has been received. It has been placed in RB's look at.

## 2015-04-02 NOTE — Telephone Encounter (Signed)
Rx submitted for the pt per request.

## 2015-04-02 NOTE — Telephone Encounter (Signed)
Called [facility] and refilled patients Lansoprazole  Called and spoke with patients wife that med was sent to pharmacy

## 2015-04-02 NOTE — Telephone Encounter (Signed)
Pt needs [place] to call into [facility] # [phone] humulin N NPH needs to be called in with the new dose45 u 3 times daily

## 2015-04-04 ENCOUNTER — Other Ambulatory Visit: Payer: Self-pay

## 2015-04-04 ENCOUNTER — Telehealth: Payer: Self-pay | Admitting: Emergency Medicine

## 2015-04-04 MED ORDER — IPRATROPIUM BROMIDE 0.02 % IN SOLN: 0.5000 mg | Freq: Four times a day (QID) | RESPIRATORY_TRACT | Status: AC

## 2015-04-04 NOTE — Telephone Encounter (Signed)
Called spoke with Apolinar Junes, the nurse. She is aware of below. Nothing further needed

## 2015-04-04 NOTE — Telephone Encounter (Signed)
Called spoke with [name] the nurse. She was calling to update RB on pt.  Reports pt had rough night last night. His pressure were high and had to change trach several times. About 8:20 am they were finally able to palpate a radial pulse and it was 89. Pt is pale in color. [name] feels pt is close to the end. Pt does have DNR and chooses to be at home. FYI for RB.

## 2015-04-04 NOTE — Telephone Encounter (Signed)
Called spoke with [name] the nurse w/ another update on pt. Still had to change trach 3 times today, pulse is weak. BP is 100/50, 02 sats is 94-96% on 4 liters. The nurse is wanting to know if RB would okay with once pt passes away, if family goes ahead and releases body to [employer]. Please advise RB thanks

## 2015-04-04 NOTE — Telephone Encounter (Signed)
The assembly with acceptable for them to go ahead and release the body. Please have them call us if we can assist in any way

## 2015-04-04 NOTE — Telephone Encounter (Signed)
Nurse brandon wants to give update [phone]

## 2015-04-05 ENCOUNTER — Telehealth: Payer: Self-pay | Admitting: Endocrinology

## 2015-04-05 NOTE — Telephone Encounter (Signed)
I contacted the pt's home health nurse and advised of note below. She voiced understanding.

## 2015-04-05 NOTE — Telephone Encounter (Signed)
Patient Name: [name] Gender: Male DOB: 1955-11-29 Age: 59 Y 2 M 15 D Return Phone Number: [phone] (Secondary) Address[place] [address] Client [facility] Night - Client Client Site [facility] Physician Romero Belling Contact Type Call Call Type Triage / Clinical Caller Name Cecil Cobbs w/ [facility] Relationship To Patient Provider Return Phone Number [phone] (Secondary) Chief Complaint Paging or Request for Consult Initial Comment Caller states she is with home health. She is supposed to call if blood sugar gets under 120. His current blood sugar is 108.Cecil Cobbs RN w/ [facility]. [phone]. His current insulin regimen is Sliding scale 200-250 he get 5 units. 251-300 7units. 301-350 9 units. 351-400 11 units. 401-450 13 units. 451-above call md. Humalin NPH 45 units 3xs a daily. Is due for the humalin now but would like instructions before she gives it to him. Nurse Assessment Nurse: Elaina Hoops, RN, Marchelle Folks Date/Time (Eastern Time): 04/04/2015 8:33:05 PM Confirm and document reason for call. If symptomatic, describe symptoms. ---Caller states he switched to comfort measures. His last blood sugar was 108. He is the same that he has been for the last day. He is not having any symptoms of low blood sugar but has been unresponsive for the last day. He is due for 45 units of insulin and the nurse was needing to know if she should continue to give the insulin or not.

## 2015-04-05 NOTE — Telephone Encounter (Signed)
See note below and please advise, Thanks! 

## 2015-04-05 NOTE — Telephone Encounter (Signed)
please call patient's wife or care giver: Please first verify that patient is continuing to receive tube-feeding. Then please decrease the NPH to 30 units, 3 times per day. Please call next week to report cbg's

## 2015-04-06 ENCOUNTER — Other Ambulatory Visit: Payer: Self-pay | Admitting: Endocrinology

## 2015-04-08 ENCOUNTER — Telehealth: Payer: Self-pay | Admitting: Endocrinology

## 2015-04-08 ENCOUNTER — Telehealth: Payer: Self-pay | Admitting: Emergency Medicine

## 2015-04-08 NOTE — Telephone Encounter (Signed)
please call patient's wife or care giver: Faxed records appears to show a cbg of 26 at 8 PM on 04/07/15. Please clarify

## 2015-04-08 NOTE — Telephone Encounter (Signed)
Called and spoke with [name] wanted to inform Dy Byrum that she was faxing over some information about pt's health that he requested Pt's wife is also requesting a call from Dr Delton Coombes once fax is received  [name] did not state why she wanted Dr Delton Coombes to call her  Will hold in triage until fax is received

## 2015-04-08 NOTE — Telephone Encounter (Signed)
Return call from vickie can be reached @ @ same # [phone].Caren Griffins

## 2015-04-08 NOTE — Telephone Encounter (Signed)
Atc, line was answered and promptly cut off.  Wcb.

## 2015-04-08 NOTE — Telephone Encounter (Signed)
Just a triage message not for judiessia.Caren Griffins

## 2015-04-09 NOTE — Telephone Encounter (Signed)
I contacted the pt's home health nurse and she advised the reading was 263 at 8pm on 04/07/2015.

## 2015-04-09 NOTE — Telephone Encounter (Signed)
Fax has been received. It has been placed in RB's look at. He is not back in the office until next week.

## 2015-04-09 NOTE — Telephone Encounter (Signed)
Thank you. Please continue the same insulin

## 2015-04-09 NOTE — Telephone Encounter (Signed)
Pt's home health nurse advised of note below and voiced understanding.

## 2015-04-11 ENCOUNTER — Other Ambulatory Visit: Payer: Self-pay | Admitting: Emergency Medicine

## 2015-04-12 ENCOUNTER — Telehealth: Payer: Self-pay | Admitting: Endocrinology

## 2015-04-12 NOTE — Telephone Encounter (Signed)
please call patient's wife or caregiver: i received cbg record. Please continue the same insulin. 

## 2015-04-15 NOTE — Telephone Encounter (Signed)
I contacted the pt's caregiver and advised of note below. She voiced understanding.

## 2015-04-16 NOTE — Telephone Encounter (Signed)
Per Lillia Abed, in RB's box. Awaiting RB's review.

## 2015-04-19 ENCOUNTER — Telehealth: Payer: Self-pay | Admitting: Endocrinology

## 2015-04-19 NOTE — Telephone Encounter (Signed)
Pt family member calling back, states she has never heard anything back from our office and patient is pretty bad off now, it has been over a week since she last called and is requesting cb from nurse at [phone]

## 2015-04-19 NOTE — Telephone Encounter (Signed)
please call patient's wife or caregiver: i received cbg record. Please continue the same insulin.

## 2015-04-19 NOTE — Telephone Encounter (Signed)
Spoke with Zella BallRobin pt spouse. She is still awaiting to hear a response from fax they sent over to RB. She reports pt does have appt on 10/28 but not sure if they can make it at this point since they have not even been able to get him up in the chair for weeks. She wants to make sure she is doing everything right by having the nurses keep pt comfortable. Please advise RB thanks

## 2015-04-19 NOTE — Telephone Encounter (Signed)
RB has reviewed the fax on the pt.  Per RB >> continue same treatments and keep patient comfortable.

## 2015-04-19 NOTE — Telephone Encounter (Signed)
Called wife and notified of RB rec Wife voiced understanding  Nothing further is needed at this time.

## 2015-04-19 NOTE — Telephone Encounter (Signed)
Keep appointment on 05/03/2015. If he has pain we can address this.

## 2015-04-19 NOTE — Telephone Encounter (Signed)
Pt's wife wants to know if pt needs to keep the appt on oct 28th?  She wants to know if pt starts having more pain, does she need to contact our office for this if this happens?

## 2015-04-19 NOTE — Telephone Encounter (Signed)
Gregory CobbsVicki [name] pt caregiver received this information and will send in blood sugar readings for pt next Friday

## 2015-04-22 ENCOUNTER — Other Ambulatory Visit: Payer: Self-pay | Admitting: Emergency Medicine

## 2015-04-24 ENCOUNTER — Ambulatory Visit: Payer: Medicare Other | Admitting: Emergency Medicine

## 2015-04-25 ENCOUNTER — Encounter: Payer: Self-pay | Admitting: Critical Care Medicine

## 2015-04-26 ENCOUNTER — Telehealth: Payer: Self-pay | Admitting: Endocrinology

## 2015-04-26 NOTE — Telephone Encounter (Signed)
Pt caregiver understood instructions.

## 2015-04-26 NOTE — Telephone Encounter (Signed)
please call patient's wife or caregiver: i received cbg record. Please continue the same insulin. No need to send cbg record on a regular basis--just prn

## 2015-05-03 ENCOUNTER — Ambulatory Visit: Payer: Medicare Other | Admitting: Emergency Medicine

## 2015-05-04 ENCOUNTER — Other Ambulatory Visit: Payer: Self-pay | Admitting: Internal Medicine

## 2015-05-06 ENCOUNTER — Encounter: Payer: Self-pay | Admitting: Critical Care Medicine

## 2015-05-08 ENCOUNTER — Telehealth: Payer: Self-pay | Admitting: Pulmonary Disease

## 2015-05-08 NOTE — Telephone Encounter (Signed)
Called by home health nurse that patient's sat was 87% on 5 L/min [place] O2. Patient of Dr. Delton CoombesByrum. Patient is DNR with no escalation in care and no transport to hospital according to home health nurse. Instruction given to go to 8 L/min [place] O2 as needed to get sat to 90%.

## 2015-05-09 ENCOUNTER — Telehealth: Payer: Self-pay | Admitting: Emergency Medicine

## 2015-05-09 NOTE — Telephone Encounter (Signed)
Per 11/2 phone note: Gregory ItoSteven E Sommer, MD at 05/08/2015 9:48 PM     Status: Signed       Expand All Collapse All   Called by home health nurse that patient's sat was 87% on 5 L/min [place] O2. Patient of Dr. Delton CoombesByrum. Patient is DNR with no escalation in care and no transport to hospital according to home health nurse. Instruction given to go to 8 L/min [place] O2 as needed to get sat to 90%      ---  Spoke with Chip BoerVicki the nurse. She reports they are needing the order from RB stating pt can be on 5-8 liters to maintain his sats up in the 90's. Pt has been dropping into the low 80's on 5 liters. Per Chip BoerVicki pt is no transport out of the house currently. Please advise RB thanks

## 2015-05-09 NOTE — Telephone Encounter (Signed)
This please make the order that they can titrate between 5 and 8 L/m

## 2015-05-09 NOTE — Telephone Encounter (Signed)
Verlon AuLeslie spoke with Chip BoerVicki the nurse and is aware of recs. Nothing further needed

## 2015-05-10 ENCOUNTER — Telehealth: Payer: Self-pay | Admitting: Emergency Medicine

## 2015-05-10 DIAGNOSIS — J9610 Chronic respiratory failure, unspecified whether with hypoxia or hypercapnia: Secondary | ICD-10-CM

## 2015-05-10 NOTE — Telephone Encounter (Signed)
Melissa called, she said she needs a written order to increase liter flow to 8L Continuous, needs this faxed as soon as possible to: [phone];  Attn: Robbie Lisave [place] Put in order for O2, printed out for Dr. Maple HudsonYoung to sign in Dr. Kavin LeechByrum's absence and [facility]. Melissa notified. Nothing further needed. Closing encounter

## 2015-05-10 NOTE — Telephone Encounter (Signed)
Left message for Melissa to call back.  

## 2015-05-10 NOTE — Telephone Encounter (Signed)
[phone]-Melissa cb, states pt is needing a increase in liter flow and needs a new concentrator, needs order for 8liters on continuous, 05/08/15 note in epic if we need note, states this is urgent

## 2015-05-13 ENCOUNTER — Telehealth: Payer: Self-pay | Admitting: Endocrinology

## 2015-05-13 ENCOUNTER — Telehealth: Payer: Self-pay | Admitting: Pulmonary Disease

## 2015-05-13 NOTE — Telephone Encounter (Signed)
[place] passed away,

## 2015-05-13 NOTE — Telephone Encounter (Signed)
Noted  

## 2015-06-06 DEATH — deceased

## 2015-09-24 ENCOUNTER — Ambulatory Visit: Payer: Medicare Other | Admitting: Endocrinology
